# Patient Record
Sex: Female | Born: 1955 | Race: White | Hispanic: No | Marital: Married | Smoking: Former smoker
Health system: Southern US, Community
[De-identification: ages and names within clinical notes are randomized; demographics above are authoritative.]

## PROBLEM LIST (undated history)

## (undated) DIAGNOSIS — E079 Disorder of thyroid, unspecified: Secondary | ICD-10-CM

## (undated) DIAGNOSIS — I2510 Atherosclerotic heart disease of native coronary artery without angina pectoris: Secondary | ICD-10-CM

## (undated) DIAGNOSIS — T7840XA Allergy, unspecified, initial encounter: Secondary | ICD-10-CM

## (undated) DIAGNOSIS — I716 Thoracoabdominal aortic aneurysm, without rupture, unspecified: Secondary | ICD-10-CM

## (undated) DIAGNOSIS — E785 Hyperlipidemia, unspecified: Secondary | ICD-10-CM

## (undated) DIAGNOSIS — I10 Essential (primary) hypertension: Secondary | ICD-10-CM

## (undated) HISTORY — DX: Essential (primary) hypertension: I10

## (undated) HISTORY — PX: TONSILLECTOMY: SUR1361

## (undated) HISTORY — DX: Atherosclerotic heart disease of native coronary artery without angina pectoris: I25.10

## (undated) HISTORY — DX: Disorder of thyroid, unspecified: E07.9

## (undated) HISTORY — DX: Hyperlipidemia, unspecified: E78.5

## (undated) HISTORY — PX: ABDOMINAL AORTIC ANEURYSM REPAIR: SUR1152

## (undated) HISTORY — DX: Allergy, unspecified, initial encounter: T78.40XA

## (undated) HISTORY — DX: Thoracoabdominal aortic aneurysm, without rupture, unspecified: I71.60

---

## 2007-03-31 ENCOUNTER — Encounter (INDEPENDENT_AMBULATORY_CARE_PROVIDER_SITE_OTHER): Payer: Self-pay | Admitting: Internal Medicine

## 2007-04-25 ENCOUNTER — Ambulatory Visit: Payer: Self-pay | Admitting: Family Medicine

## 2007-04-25 DIAGNOSIS — E782 Mixed hyperlipidemia: Secondary | ICD-10-CM

## 2007-04-25 DIAGNOSIS — E039 Hypothyroidism, unspecified: Secondary | ICD-10-CM

## 2007-04-25 DIAGNOSIS — N951 Menopausal and female climacteric states: Secondary | ICD-10-CM

## 2007-04-25 DIAGNOSIS — F329 Major depressive disorder, single episode, unspecified: Secondary | ICD-10-CM

## 2007-04-25 DIAGNOSIS — G43009 Migraine without aura, not intractable, without status migrainosus: Secondary | ICD-10-CM | POA: Insufficient documentation

## 2007-04-25 DIAGNOSIS — E785 Hyperlipidemia, unspecified: Secondary | ICD-10-CM | POA: Insufficient documentation

## 2007-04-25 DIAGNOSIS — F17200 Nicotine dependence, unspecified, uncomplicated: Secondary | ICD-10-CM | POA: Insufficient documentation

## 2007-04-25 DIAGNOSIS — I10 Essential (primary) hypertension: Secondary | ICD-10-CM

## 2007-05-13 ENCOUNTER — Ambulatory Visit: Payer: Self-pay | Admitting: Family Medicine

## 2007-05-15 LAB — CONVERTED CEMR LAB
Direct LDL: 144.3 mg/dL
HDL: 33.4 mg/dL — ABNORMAL LOW (ref >39.0)
VLDL: 41 mg/dL — ABNORMAL HIGH (ref 0–40)

## 2007-05-22 ENCOUNTER — Ambulatory Visit: Payer: Self-pay | Admitting: Family Medicine

## 2007-07-24 ENCOUNTER — Ambulatory Visit: Payer: Self-pay | Admitting: Family Medicine

## 2007-07-24 ENCOUNTER — Encounter (INDEPENDENT_AMBULATORY_CARE_PROVIDER_SITE_OTHER): Payer: Self-pay | Admitting: Internal Medicine

## 2007-07-25 LAB — CONVERTED CEMR LAB
AST: 17 units/L (ref 0–37)
Direct LDL: 151.8 mg/dL
TSH: 1.56 microintl units/mL (ref 0.35–5.50)
Triglycerides: 133 mg/dL (ref 0–149)

## 2007-07-29 ENCOUNTER — Other Ambulatory Visit: Admission: RE | Admit: 2007-07-29 | Discharge: 2007-07-29 | Payer: Self-pay | Admitting: Family Medicine

## 2007-07-29 ENCOUNTER — Ambulatory Visit: Payer: Self-pay | Admitting: Family Medicine

## 2007-07-29 ENCOUNTER — Encounter (INDEPENDENT_AMBULATORY_CARE_PROVIDER_SITE_OTHER): Payer: Self-pay | Admitting: Internal Medicine

## 2007-07-29 LAB — CONVERTED CEMR LAB: Pap Smear: NORMAL

## 2007-08-05 ENCOUNTER — Encounter (INDEPENDENT_AMBULATORY_CARE_PROVIDER_SITE_OTHER): Payer: Self-pay | Admitting: *Deleted

## 2007-08-05 ENCOUNTER — Encounter (INDEPENDENT_AMBULATORY_CARE_PROVIDER_SITE_OTHER): Payer: Self-pay | Admitting: Internal Medicine

## 2008-09-07 ENCOUNTER — Telehealth (INDEPENDENT_AMBULATORY_CARE_PROVIDER_SITE_OTHER): Payer: Self-pay | Admitting: Internal Medicine

## 2008-09-15 ENCOUNTER — Ambulatory Visit: Payer: Self-pay | Admitting: Family Medicine

## 2008-09-16 ENCOUNTER — Ambulatory Visit: Payer: Self-pay | Admitting: Family Medicine

## 2008-09-20 LAB — CONVERTED CEMR LAB: TSH: 1.36 microintl units/mL (ref 0.35–5.50)

## 2008-09-21 ENCOUNTER — Telehealth (INDEPENDENT_AMBULATORY_CARE_PROVIDER_SITE_OTHER): Payer: Self-pay | Admitting: Internal Medicine

## 2008-09-21 LAB — CONVERTED CEMR LAB
ALT: 24 units/L (ref 0–35)
AST: 22 units/L (ref 0–37)
HDL: 42.6 mg/dL (ref >39.00)

## 2009-02-24 ENCOUNTER — Ambulatory Visit: Payer: Self-pay | Admitting: Family Medicine

## 2009-02-24 ENCOUNTER — Other Ambulatory Visit: Admission: RE | Admit: 2009-02-24 | Discharge: 2009-02-24 | Payer: Self-pay | Admitting: Family Medicine

## 2009-02-24 DIAGNOSIS — K921 Melena: Secondary | ICD-10-CM | POA: Insufficient documentation

## 2009-02-24 DIAGNOSIS — M79609 Pain in unspecified limb: Secondary | ICD-10-CM

## 2009-03-02 ENCOUNTER — Encounter (INDEPENDENT_AMBULATORY_CARE_PROVIDER_SITE_OTHER): Payer: Self-pay | Admitting: *Deleted

## 2009-03-10 ENCOUNTER — Encounter (INDEPENDENT_AMBULATORY_CARE_PROVIDER_SITE_OTHER): Payer: Self-pay | Admitting: Internal Medicine

## 2009-03-10 ENCOUNTER — Ambulatory Visit: Payer: Self-pay | Admitting: Family Medicine

## 2009-03-15 ENCOUNTER — Encounter (INDEPENDENT_AMBULATORY_CARE_PROVIDER_SITE_OTHER): Payer: Self-pay

## 2009-03-15 LAB — CONVERTED CEMR LAB: Vit D, 25-Hydroxy: 12 ng/mL — ABNORMAL LOW (ref 30–89)

## 2009-03-16 LAB — CONVERTED CEMR LAB
ALT: 35 units/L (ref 0–35)
Albumin: 4.1 g/dL (ref 3.5–5.2)
BUN: 8 mg/dL (ref 6–23)
Basophils Absolute: 0.1 10*3/uL (ref 0.0–0.1)
CO2: 29 meq/L (ref 19–32)
Chloride: 106 meq/L (ref 96–112)
Cholesterol: 166 mg/dL (ref 0–200)
Creatinine, Ser: 0.8 mg/dL (ref 0.4–1.2)
Glucose, Bld: 92 mg/dL (ref 70–99)
HCT: 40.6 % (ref 36.0–46.0)
Hemoglobin: 14 g/dL (ref 12.0–15.0)
LDL Cholesterol: 104 mg/dL — ABNORMAL HIGH (ref 0–99)
Lymphs Abs: 1.6 10*3/uL (ref 0.7–4.0)
MCHC: 34.4 g/dL (ref 30.0–36.0)
MCV: 98.7 fL (ref 78.0–100.0)
Monocytes Absolute: 0.4 10*3/uL (ref 0.1–1.0)
Neutro Abs: 3.1 10*3/uL (ref 1.4–7.7)
Platelets: 160 10*3/uL (ref 150.0–400.0)
RDW: 12.4 % (ref 11.5–14.6)
Total Bilirubin: 0.7 mg/dL (ref 0.3–1.2)
Triglycerides: 104 mg/dL (ref 0.0–149.0)

## 2009-03-22 ENCOUNTER — Encounter (INDEPENDENT_AMBULATORY_CARE_PROVIDER_SITE_OTHER): Payer: Self-pay | Admitting: Internal Medicine

## 2009-04-04 ENCOUNTER — Encounter (INDEPENDENT_AMBULATORY_CARE_PROVIDER_SITE_OTHER): Payer: Self-pay | Admitting: Internal Medicine

## 2009-04-06 ENCOUNTER — Encounter (INDEPENDENT_AMBULATORY_CARE_PROVIDER_SITE_OTHER): Payer: Self-pay | Admitting: Internal Medicine

## 2009-05-09 ENCOUNTER — Telehealth: Payer: Self-pay | Admitting: Family Medicine

## 2009-05-10 ENCOUNTER — Telehealth: Payer: Self-pay | Admitting: Family Medicine

## 2009-06-14 ENCOUNTER — Ambulatory Visit: Payer: Self-pay | Admitting: Family Medicine

## 2009-06-14 DIAGNOSIS — E559 Vitamin D deficiency, unspecified: Secondary | ICD-10-CM | POA: Insufficient documentation

## 2009-06-15 LAB — CONVERTED CEMR LAB: Vit D, 25-Hydroxy: 35 ng/mL (ref 30–89)

## 2009-09-14 ENCOUNTER — Ambulatory Visit: Payer: Self-pay | Admitting: Internal Medicine

## 2009-09-14 LAB — CONVERTED CEMR LAB
ALT: 19 units/L (ref 0–35)
HDL: 46.9 mg/dL (ref >39.00)
TSH: 0.16 microintl units/mL — ABNORMAL LOW (ref 0.35–5.50)
Total CHOL/HDL Ratio: 4
Triglycerides: 126 mg/dL (ref 0.0–149.0)

## 2009-11-08 ENCOUNTER — Telehealth: Payer: Self-pay | Admitting: Family Medicine

## 2009-11-14 ENCOUNTER — Ambulatory Visit: Payer: Self-pay | Admitting: Family Medicine

## 2009-12-19 ENCOUNTER — Telehealth: Payer: Self-pay | Admitting: Family Medicine

## 2010-01-02 ENCOUNTER — Encounter: Payer: Self-pay | Admitting: Family Medicine

## 2010-01-05 ENCOUNTER — Telehealth (INDEPENDENT_AMBULATORY_CARE_PROVIDER_SITE_OTHER): Payer: Self-pay | Admitting: *Deleted

## 2010-01-23 ENCOUNTER — Ambulatory Visit: Payer: Self-pay | Admitting: Family Medicine

## 2010-01-24 LAB — CONVERTED CEMR LAB
Cholesterol: 188 mg/dL (ref 0–200)
Total CHOL/HDL Ratio: 5
Triglycerides: 230 mg/dL — ABNORMAL HIGH (ref 0.0–149.0)

## 2010-03-06 ENCOUNTER — Ambulatory Visit: Payer: Self-pay | Admitting: Family Medicine

## 2010-03-06 DIAGNOSIS — J069 Acute upper respiratory infection, unspecified: Secondary | ICD-10-CM | POA: Insufficient documentation

## 2010-04-04 ENCOUNTER — Ambulatory Visit: Payer: Self-pay | Admitting: Family Medicine

## 2010-04-04 LAB — HM MAMMOGRAPHY: HM Mammogram: NORMAL

## 2010-05-09 NOTE — Progress Notes (Signed)
Summary: Patient Concern  Phone Note Call from Patient   Caller: Spouse Details for Reason: Pt was not informed of charge for forms Summary of Call: Stated patient was not informed of charge for form and was upset that this was not explained.   Initial call taken by: Clarisa Schools,  January 05, 2010 11:45 AM

## 2010-05-09 NOTE — Progress Notes (Signed)
Summary: regarding effexor  Phone Note From Pharmacy   [facility] [phone] * Summary of Call: Pt has script for brand name only effexor but she only wants generic.  Please advise. Initial call taken by: Lowella Petties CMA,  December 19, 2009 3:39 PM  Follow-up for Phone Call        ok to send prescription for generic. Ruthe Mannan MD  December 19, 2009 3:51 PM   Additional Follow-up for Phone Call Additional follow up Details #1::        Advised pharmacist at [facility]. Additional Follow-up by: Lowella Petties CMA,  December 19, 2009 4:44 PM

## 2010-05-09 NOTE — Assessment & Plan Note (Signed)
Summary: [name] F/U DLO   Vital Signs:  Patient profile:   55 year old female Height:      61 inches Weight:      156 pounds BMI:     29.58 Temp:     98.1 degrees F oral Pulse rate:   76 / minute Pulse rhythm:   regular BP sitting:   136 / 82  (left arm) Cuff size:   regular  Vitals Entered By: Linde Gillis CMA Duncan Dull) (January 23, 2010 8:49 AM) CC: 3 month follow up   History of Present Illness: 55 yo here to follow up HLD, Hypothyroidism.  1.  HLD- decreased simvasatin to 20 mg at last office visit in August.  Myalgias much better.    2.  Hypothyroidism- decreased leovthroid to 50 micrograms from 75 micrograms in August.  Feels she is sleeping better, has less GI upset.  Overall, feels much better.  Current Medications (verified): 1)  Amlodipine Besylate 5 Mg  Tabs (Amlodipine Besylate) .... Take 1 Tablet By Mouth Once A Day 2)  Effexor Xr 150 Mg  Cp24 (Venlafaxine Hcl) .Marland Kitchen.. 1 Once Daily For Depression 3)  Calcium Carbonate-Vitamin D 600-400 Mg-Unit  Tabs (Calcium Carbonate-Vitamin D) .... Otc Takes Two Daily 4)  Vitamin B-12 500 Mcg  Tabs (Cyanocobalamin) .... Otc As Directed 5)  Ferrous Sulfate 325 (65 Fe) Mg  Tabs (Ferrous Sulfate) .... Otc One Daily 6)  Ibuprofen 200 Mg Tabs (Ibuprofen) .... Otc As Directed 7)  Ergocalciferol 50000 Unit Caps (Ergocalciferol) .... Take One Capsule Each Week On The Same Day of The Week. 8)  Levothroid 50 Mcg Tabs (Levothyroxine Sodium) .Marland Kitchen.. 1 Tab By Mouth Daily. 9)  Simvastatin 20 Mg Tabs (Simvastatin) .Marland Kitchen.. 1 By Mouth At Bedtime  Allergies (verified): No Known Drug Allergies  Past History:  Past Medical History: Last updated: April 28, 2007 Hypertension Hypothyroidism  Past Surgical History: Last updated: 07/29/2007 [facility] Tonsillectomy--age 55  Family History: Last updated: 04-28-07 Father: died Kaylee--CVA/MI,    Mother: died 71--COPD,  Siblings: 1 half sis--DM,    DM-0 MI-0 CVA- Mcousin Prostate  Cancer-0 Breast Cancer-Maunt Ovarian Cancer-0 Uterine Cancer-? mAUNT Colon Cancer-0 Drug/ ETOH Abuse-0 Depression- 0  Social History: Last updated: 04-28-2007 Marital Status: Married Children: 2 45, 63, 1 grand son --54mo Occupation: Engineer, materials at [employer]  Risk Factors: Alcohol Use: 0 (02/24/2009) Caffeine Use: 5 (02/24/2009) Exercise: no (02/24/2009)  Risk Factors: Smoking Status: current (02/24/2009) Packs/Day: 1.0 (02/24/2009) Passive Smoke Exposure: yes (April 28, 2007)  Review of Systems      See HPI General:  Denies malaise. Eyes:  Denies blurring. ENT:  Denies difficulty swallowing. CV:  Denies chest pain or discomfort. Resp:  Denies shortness of breath. GI:  Denies abdominal pain and change in bowel habits.  Physical Exam  General:  alert, well-developed, well-nourished, and well-hydrated.   Lungs:  normal respiratory effort, no intercostal retractions, no accessory muscle use, and normal breath sounds.   Heart:  normal rate, regular rhythm, and no murmur.   Extremities:  no edema either lower leg Psych:  normally interactive and good eye contact.     Impression & Recommendations:  Problem # 1:  HYPERLIPIDEMIA (ICD-272.2) Assessment Unchanged Recheck FLP today. Her updated medication list for this problem includes:    Simvastatin 20 Mg Tabs (Simvastatin) .Marland Kitchen... 1 by mouth at bedtime  Orders: Venipuncture (16109) TLB-Lipid Panel (80061-LIPID)  Problem # 2:  HYPOTHYROIDISM (ICD-244.9) Assessment: Unchanged Recheck TSH today.  Hopefully since her symptoms  have improved  on lower dose, TSh will have improved. Her updated medication list for this problem includes:    Levothroid 50 Mcg Tabs (Levothyroxine sodium) .Marland Kitchen... 1 tab by mouth daily.  Orders: Venipuncture (45409) TLB-TSH (Thyroid Stimulating Hormone) (84443-TSH)  Complete Medication List: 1)  Amlodipine Besylate 5 Mg Tabs (Amlodipine besylate) .... Take 1 tablet by mouth once a  day 2)  Effexor Xr 150 Mg Cp24 (Venlafaxine hcl) .Marland Kitchen.. 1 once daily for depression 3)  Calcium Carbonate-vitamin D 600-400 Mg-unit Tabs (Calcium carbonate-vitamin d) .... Otc takes two daily 4)  Vitamin B-12 500 Mcg Tabs (Cyanocobalamin) .... Otc as directed 5)  Ferrous Sulfate 325 (65 Fe) Mg Tabs (Ferrous sulfate) .... Otc one daily 6)  Ibuprofen 200 Mg Tabs (Ibuprofen) .... Otc as directed 7)  Ergocalciferol 50000 Unit Caps (Ergocalciferol) .... Take one capsule each week on the same day of the week. 8)  Levothroid 50 Mcg Tabs (Levothyroxine sodium) .Marland Kitchen.. 1 tab by mouth daily. 9)  Simvastatin 20 Mg Tabs (Simvastatin) .Marland Kitchen.. 1 by mouth at bedtime   Orders Added: 1)  Venipuncture [36415] 2)  TLB-Lipid Panel [80061-LIPID] 3)  TLB-TSH (Thyroid Stimulating Hormone) [84443-TSH] 4)  Est. Patient Level IV [81191]    Current Allergies (reviewed today): No known allergies  Appended Document: [name] F/U DLO     Allergies: No Known Drug Allergies   Complete Medication List: 1)  Amlodipine Besylate 5 Mg Tabs (Amlodipine besylate) .... Take 1 tablet by mouth once a day 2)  Effexor Xr 150 Mg Cp24 (Venlafaxine hcl) .Marland Kitchen.. 1 once daily for depression 3)  Calcium Carbonate-vitamin D 600-400 Mg-unit Tabs (Calcium carbonate-vitamin d) .... Otc takes two daily 4)  Vitamin B-12 500 Mcg Tabs (Cyanocobalamin) .... Otc as directed 5)  Ferrous Sulfate 325 (65 Fe) Mg Tabs (Ferrous sulfate) .... Otc one daily 6)  Ibuprofen 200 Mg Tabs (Ibuprofen) .... Otc as directed 7)  Ergocalciferol 50000 Unit Caps (Ergocalciferol) .... Take one capsule each week on the same day of the week. 8)  Levothroid 50 Mcg Tabs (Levothyroxine sodium) .Marland Kitchen.. 1 tab by mouth daily. 9)  Simvastatin 20 Mg Tabs (Simvastatin) .Marland Kitchen.. 1 by mouth at bedtime  Other Orders: Admin 1st Vaccine (47829) Flu Vaccine 57yrs + [phone])   Orders Added: 1)  Admin 1st Vaccine [90471] 2)  Flu Vaccine 57yrs + [08657]  Flu Vaccine Consent Questions     Do  you have a history of severe allergic reactions to this vaccine? no    Any prior history of allergic reactions to egg and/or gelatin? no    Do you have a sensitivity to the preservative Thimersol? no    Do you have a past history of Guillan-Barre Syndrome? no    Do you currently have an acute febrile illness? no    Have you ever had a severe reaction to latex? no    Vaccine information given and explained to patient? yes    Are you currently pregnant? no    Lot Number:[id number]   Exp Date:10/07/2010   Site Given  Right Deltoid IM        .lbflu

## 2010-05-09 NOTE — Progress Notes (Signed)
Summary: Venlafaxine ER 75mg  refill  Phone Note Refill Request Call back at [phone] Message from:  [facility] on May 09, 2009 9:49 AM  Refills Requested: Medication #1:  EFFEXOR XR 75 MG XR24H-CAP take 3 daily for 2 wks [facility] refill on Effexor XR 75mg . No refill date given, Please advise.    Method Requested: Electronic Initial call taken by: Lewanda Rife LPN,  May 09, 2009 9:50 AM    Prescriptions: EFFEXOR XR 150 MG  CP24 (VENLAFAXINE HCL) 1 once daily for depression Brand medically necessary #30 x 6   Entered and Authorized by:   Shaune Leeks MD   Signed by:   Shaune Leeks MD on 05/09/2009   Method used:   [facility]        [facility]  [address] [address] [id number] ([facility]       [address]       [place], [place]  [address]       Ph: [phone] or [phone]       Fax: [phone]   RxID:   [id number]

## 2010-05-09 NOTE — Progress Notes (Signed)
Summary: Venlafaxine instead of name brand refill  Phone Note Call from Patient Call back at [phone] OR [phone]   Caller: Patient Call For: Dr. Hetty Ely Summary of Call: Pt went to pick up Venlafaxine HCL ER and was the name brand Effexor. Pt cannot afford the name brand and would like [facility]af[facility]ine sent to [facility].Please advise.  Initial call taken by: Lewanda Rife LPN,  May 10, 2009 12:49 PM  Follow-up for Phone Call        Generic fine with me. Follow-up by: Shaune Leeks MD,  May 10, 2009 1:49 PM  Additional Follow-up for Phone Call Additional follow up Details #1::        Pharmacy notified as instructed. Patient informed that pharmacy has been notified. Additional Follow-up by: Sydell Axon LPN,  May 10, 2009 4:14 PM

## 2010-05-09 NOTE — Progress Notes (Signed)
Summary: regarding possible drug interaction  Phone Note From Pharmacy   [facility]  [address] [phone] * Summary of Call: Note regarding drug interaction between simvastatin and amlodipine is on your desk. Initial call taken by: Lowella Petties CMA,  November 08, 2009 2:56 PM  Follow-up for Phone Call        Please have pt make appt so we can adjust her medicaiton.  please make this a 30 min appt. Ruthe Mannan MD  November 09, 2009 7:24 AM  30 minute appt made for 11/14/2009. Follow-up by: Linde Gillis CMA Duncan Dull),  November 09, 2009 9:11 AM

## 2010-05-09 NOTE — Letter (Signed)
Summary: Health Examination Certificate  Health Examination Certificate   Imported By: Maryln Gottron 01/09/2010 10:51:08  _____________________________________________________________________  External Attachment:    Type:   Image     Comment:   External Document

## 2010-05-09 NOTE — Assessment & Plan Note (Signed)
Summary: DEEP COUGH, COUGHING UP PHLIM / LFW   Vital Signs:  Patient profile:   55 year old female Height:      61 inches Weight:      157.25 pounds BMI:     29.82 Temp:     98.5 degrees F oral Pulse rate:   80 / minute Pulse rhythm:   regular BP sitting:   150 / 82  (left arm) Cuff size:   regular  Vitals Entered By: Linde Gillis CMA Duncan Dull) (March 06, 2010 3:50 PM) CC: deep cough, elevated BP   History of Present Illness: 55 yo here to discuss multiple issues.  1. URI- had URI symptoms 3 weeks ago- runny nose, chills, subjective fever, sore throat.  All symptoms resolved but still has a deep productive cough. No SOB.     HLD- decreased simvasatin to 40 mg .  Myalgias have not returned.    .  Hypothyroidism- decreased leovthroid to 50 micrograms from 75 micrograms in August. Now feels very fatigued.  TSH was elevated in October but she did not want to increase her medication at that time.  HTN- BP has been deteriorating over past several weeks.  Sister is an Charity fundraiser and has been checking her BP running in 150s-160s/80s- 90s.  On amlodipine 5 mg daily.  Some mild HA.  No  blurred vision or CP.  Current Medications (verified): 1)  Amlodipine Besylate 10 Mg  Tabs (Amlodipine Besylate) .... Once Daily 2)  Effexor Xr 150 Mg  Cp24 (Venlafaxine Hcl) .Marland Kitchen.. 1 Once Daily For Depression 3)  Calcium Carbonate-Vitamin D 600-400 Mg-Unit  Tabs (Calcium Carbonate-Vitamin D) .... Otc Takes Two Daily 4)  Vitamin B-12 500 Mcg  Tabs (Cyanocobalamin) .... Otc As Directed 5)  Ferrous Sulfate 325 (65 Fe) Mg  Tabs (Ferrous Sulfate) .... Otc One Daily 6)  Ibuprofen 200 Mg Tabs (Ibuprofen) .... Otc As Directed 7)  Ergocalciferol 50000 Unit Caps (Ergocalciferol) .... Take One Capsule Each Week On The Same Day of The Week. 8)  Levothyroxine Sodium 75 Mcg  Tabs (Levothyroxine Sodium) .Marland Kitchen.. 1 By Mouth Daily 9)  Simvastatin 40 Mg Tabs (Simvastatin) .... Take One Tablet At Bedtime 10)  Tussionex Pennkinetic Er  8-10 Mg/90ml Lqcr (Chlorpheniramine-Hydrocodone) .... 5 Ml By Mouth Two Times A Day As Needed Cough.  Allergies (verified): No Known Drug Allergies  Past History:  Past Medical History: Last updated: 05-10-07 Hypertension Hypothyroidism  Past Surgical History: Last updated: 07/29/2007 [facility] Tonsillectomy--age 73  Family History: Last updated: 05/10/2007 Father: died 73--CVA/MI,    Mother: died 71--COPD,  Siblings: 1 half sis--DM,    DM-0 MI-0 CVA- Mcousin Prostate Cancer-0 Breast Cancer-Maunt Ovarian Cancer-0 Uterine Cancer-? mAUNT Colon Cancer-0 Drug/ ETOH Abuse-0 Depression- 0  Social History: Last updated: 2007-05-10 Marital Status: Married Children: 2 58, 39, 1 grand son --24mo Occupation: Engineer, materials at [employer]  Risk Factors: Alcohol Use: 0 (02/24/2009) Caffeine Use: 5 (02/24/2009) Exercise: no (02/24/2009)  Risk Factors: Smoking Status: current (02/24/2009) Packs/Day: 1.0 (02/24/2009) Passive Smoke Exposure: yes (2007-05-10)  Review of Systems      See HPI General:  Denies chills and fever. CV:  Denies chest pain or discomfort. GI:  Denies abdominal pain.  Physical Exam  General:  alert, well-developed, well-nourished, and well-hydrated.   Head:  normocephalic and atraumatic.   Eyes:  vision grossly intact, pupils equal, pupils round, and pupils reactive to light.   Ears:  R ear normal and L ear normal.   Nose:  no  external deformity.   Mouth:  good dentition.   Neck:  no masses, no thyromegaly, no JVD, and no carotid bruits.   Lungs:  normal respiratory effort, no intercostal retractions, no accessory muscle use, and normal breath sounds.   Heart:  normal rate, regular rhythm, and no murmur.   Extremities:  no edema either lower leg Psych:  normally interactive and good eye contact.     Impression & Recommendations:  Problem # 1:  URI (ICD-465.9) Assessment New Likely viral, resolving.  Cough  suppressant as needed.   Her updated medication list for this problem includes:    Ibuprofen 200 Mg Tabs (Ibuprofen) ..... Otc as directed    Tussionex Pennkinetic Er 8-10 Mg/62ml Lqcr (Chlorpheniramine-hydrocodone) .Marland KitchenMarland KitchenMarland KitchenMarland Kitchen 5 ml by mouth two times a day as needed cough.  Problem # 2:  HYPERLIPIDEMIA (ICD-272.2) Assessment: Unchanged  Her updated medication list for this problem includes:    Simvastatin 40 Mg Tabs (Simvastatin) .Marland Kitchen... Take one tablet at bedtime  Problem # 3:  HYPOTHYROIDISM (ICD-244.9) Assessment: Deteriorated Increase synthroid to 75 micrograms.  Recheck in 8 weeks. Her updated medication list for this problem includes:    Levothyroxine Sodium 75 Mcg Tabs (Levothyroxine sodium) .Marland Kitchen... 1 by mouth daily  Problem # 4:  HYPERTENSION (ICD-401.9) Assessment: Deteriorated Increase Amlodipine to 10 mg daily.  Follow up in one month. Her updated medication list for this problem includes:    Amlodipine Besylate 10 Mg Tabs (Amlodipine besylate) ..... Once daily  Complete Medication List: 1)  Amlodipine Besylate 10 Mg Tabs (Amlodipine besylate) .... Once daily 2)  Effexor Xr 150 Mg Cp24 (Venlafaxine hcl) .Marland Kitchen.. 1 once daily for depression 3)  Calcium Carbonate-vitamin D 600-400 Mg-unit Tabs (Calcium carbonate-vitamin d) .... Otc takes two daily 4)  Vitamin B-12 500 Mcg Tabs (Cyanocobalamin) .... Otc as directed 5)  Ferrous Sulfate 325 (65 Fe) Mg Tabs (Ferrous sulfate) .... Otc one daily 6)  Ibuprofen 200 Mg Tabs (Ibuprofen) .... Otc as directed 7)  Ergocalciferol 50000 Unit Caps (Ergocalciferol) .... Take one capsule each week on the same day of the week. 8)  Levothyroxine Sodium 75 Mcg Tabs (Levothyroxine sodium) .Marland Kitchen.. 1 by mouth daily 9)  Simvastatin 40 Mg Tabs (Simvastatin) .... Take one tablet at bedtime 10)  Tussionex Pennkinetic Er 8-10 Mg/37ml Lqcr (Chlorpheniramine-hydrocodone) .... 5 ml by mouth two times a day as needed cough.  Patient Instructions: 1)  Please schedule lab  visit at end of December- TSH, lipid panel, hepatic panel (244.9, 272.4) Prescriptions: SIMVASTATIN 40 MG TABS (SIMVASTATIN) Take one tablet at bedtime  #90 x 3   Entered and Authorized by:   Ruthe Mannan MD   Signed by:   Ruthe Mannan MD on 03/06/2010   Method used:   [facility]        [id number]       [address]       [place], [place]  [address]       Ph: [phone] or [phone]       Fax: [phone]   RxID:   [id number] EFFEXOR XR 150 MG  CP24 (VENLAFAXINE HCL) 1 once daily for depression Brand medically necessary #90 x 6   Entered and Authorized by:   Ruthe Mannan MD   Signed by:   Ruthe Mannan MD on 03/06/2010   Method used:   [facility]        [facility]  [phone] [facility] (retail)       [address]       [place], [place]  [address]       Ph: [phone] or [phone]       Fax: [phone]   RxID:   [id number] AMLODIPINE BESYLATE 10 MG  TABS (AMLODIPINE BESYLATE) once daily  #90 x 3   Entered and Authorized by:   Ruthe Mannan MD   Signed by:   Ruthe Mannan MD on 03/06/2010   Method used:   [facility]        [id number]       [address]       [place], [place]  [address]       Ph: [phone] or [phone]       Fax: [phone]   RxID:   [id number] LEVOTHYROXINE SODIUM 75 MCG  TABS (LEVOTHYROXINE SODIUM) 1 by mouth daily  #90 x 3   Entered and Authorized by:   Ruthe Mannan MD   Signed by:   Ruthe Mannan MD on 03/06/2010   Method used:   [facility]        [id number]       [address]       [place], [place]  [address]       Ph: [phone] or [phone]       Fax: [phone]   RxID:   [id number] Sandria Senter ER 8-10 MG/5ML LQCR (CHLORPHENIRAMINE-HYDROCODONE) 5 ml by mouth two times a day as needed cough.  #5 ounces x 0   Entered and Authorized by:   Ruthe Mannan MD   Signed by:    Ruthe Mannan MD on 03/06/2010   Method used:   Print then Give to Patient   RxID:   [id number]    Orders Added: 1)  Est. Patient Level IV [44315]    Current Allergies (reviewed today): No known allergies

## 2010-05-09 NOTE — Assessment & Plan Note (Signed)
Summary: discuss drug interactions/nt   Vital Signs:  Patient profile:   55 year old female Height:      61 inches Weight:      155.13 pounds BMI:     29.42 Temp:     98.3 degrees F oral Pulse rate:   76 / minute Pulse rhythm:   regular BP sitting:   130 / 72  (left arm) Cuff size:   regular  Vitals Entered By: Linde Gillis CMA Simmie Camerer Dull) (November 14, 2009 2:34 PM) CC: discuss drug interaction   History of Present Illness: 55 yo new to me here to discuss a couple of issues.  1.  Drug interaction between Zocor and amlodipine.  She has had some myalgias in her arms and legs in past, not having any currently. Fasting lipid panel in June was great.  Currently on Zocor 40 mg daily and Amlodipine 5 mg daily.  2.  Low TSH- in June ,TSH was 0.16.  On Levothroid 75 micrograms daily.  Has noticed some hot flashes, difficulty sleeping, GI upset.  Denies palpitations.  Current Medications (verified): 1)  Amlodipine Besylate 5 Mg  Tabs (Amlodipine Besylate) .... Take 1 Tablet By Mouth Once A Day 2)  Effexor Xr 150 Mg  Cp24 (Venlafaxine Hcl) .Marland Kitchen.. 1 Once Daily For Depression 3)  Calcium Carbonate-Vitamin D 600-400 Mg-Unit  Tabs (Calcium Carbonate-Vitamin D) .... Otc Takes Two Daily 4)  Vitamin B-12 500 Mcg  Tabs (Cyanocobalamin) .... Otc As Directed 5)  Ferrous Sulfate 325 (65 Fe) Mg  Tabs (Ferrous Sulfate) .... Otc One Daily 6)  Ibuprofen 200 Mg Tabs (Ibuprofen) .... Otc As Directed 7)  Ergocalciferol 50000 Unit Caps (Ergocalciferol) .... Take One Capsule Each Week On The Same Day of The Week. 8)  Levothroid 50 Mcg Tabs (Levothyroxine Sodium) .Marland Kitchen.. 1 Tab By Mouth Daily. 9)  Simvastatin 20 Mg Tabs (Simvastatin) .Marland Kitchen.. 1 By Mouth At Bedtime  Allergies (verified): No Known Drug Allergies  Past History:  Past Medical History: Last updated: May 07, 2007 Hypertension Hypothyroidism  Past Surgical History: Last updated: 07/29/2007 [facility] Tonsillectomy--age 2  Family  History: Last updated: 05-07-2007 Father: died 73--CVA/MI,    Mother: died 71--COPD,  Siblings: 1 half sis--DM,    DM-0 MI-0 CVA- [place] Prostate Cancer-0 Breast Cancer-[place] Ovarian Cancer-0 Uterine Cancer-? mAUNT Colon Cancer-0 Drug/ ETOH Abuse-0 Depression- 0  Social History: Last updated: 2007-05-07 Marital Status: Married Children: 2 57, 98, 1 grand son --76mo Occupation: Engineer, materials at [employer]  Risk Factors: Alcohol Use: 0 (02/24/2009) Caffeine Use: 5 (02/24/2009) Exercise: no (02/24/2009)  Risk Factors: Smoking Status: current (02/24/2009) Packs/Day: 1.0 (02/24/2009) Passive Smoke Exposure: yes (May 07, 2007)  Review of Systems      See HPI General:  Denies malaise. Eyes:  Denies blurring. ENT:  Denies difficulty swallowing. CV:  Denies chest pain or discomfort and palpitations. Resp:  Denies shortness of breath. GI:  Complains of change in bowel habits; denies abdominal pain. Endo:  Complains of heat intolerance.  Physical Exam  General:  alert, well-developed, well-nourished, and well-hydrated.   Neck:  no masses, no thyromegaly, no JVD, and no carotid bruits.   Lungs:  normal respiratory effort, no intercostal retractions, no accessory muscle use, and normal breath sounds.   Heart:  normal rate, regular rhythm, and no murmur.   Extremities:  no edema either lower leg Psych:  normally interactive and good eye contact.     Impression & Recommendations:  Problem # 1:  HYPERLIPIDEMIA (ICD-272.2) Assessment Unchanged Time spent  with patient 25 minutes, more than 50% of this time was spent counseling patient on hyperlipidemia and hypothyroidism. Lipids well controlled when last checked in June. Decided to decrease Zocor to 20 mg and recheck lipids in 3 months.  If elevated at that time, change to Crestor.  The following medications were removed from the medication list:    Zocor 40 Mg Tabs (Simvastatin) .Marland Kitchen... 1 once daily for  cholesterol Her updated medication list for this problem includes:    Simvastatin 20 Mg Tabs (Simvastatin) .Marland Kitchen... 1 by mouth at bedtime  Problem # 2:  HYPOTHYROIDISM (ICD-244.9) Assessment: Deteriorated Decrease to levothroid 50 micrograms daily.  Recheck TSH in 3 months. The following medications were removed from the medication list:    Levothroid 75 Mcg Tabs (Levothyroxine sodium) .Marland Kitchen... 1 once daily by mouth Her updated medication list for this problem includes:    Levothroid 50 Mcg Tabs (Levothyroxine sodium) .Marland Kitchen... 1 tab by mouth daily.  Complete Medication List: 1)  Amlodipine Besylate 5 Mg Tabs (Amlodipine besylate) .... Take 1 tablet by mouth once a day 2)  Effexor Xr 150 Mg Cp24 (Venlafaxine hcl) .Marland Kitchen.. 1 once daily for depression 3)  Calcium Carbonate-vitamin D 600-400 Mg-unit Tabs (Calcium carbonate-vitamin d) .... Otc takes two daily 4)  Vitamin B-12 500 Mcg Tabs (Cyanocobalamin) .... Otc as directed 5)  Ferrous Sulfate 325 (65 Fe) Mg Tabs (Ferrous sulfate) .... Otc one daily 6)  Ibuprofen 200 Mg Tabs (Ibuprofen) .... Otc as directed 7)  Ergocalciferol 50000 Unit Caps (Ergocalciferol) .... Take one capsule each week on the same day of the week. 8)  Levothroid 50 Mcg Tabs (Levothyroxine sodium) .Marland Kitchen.. 1 tab by mouth daily. 9)  Simvastatin 20 Mg Tabs (Simvastatin) .Marland Kitchen.. 1 by mouth at bedtime  Patient Instructions: 1)  Great to meet you. 2)  Please come back to see me in 3 months. Prescriptions: EFFEXOR XR 75 MG XR24H-CAP (VENLAFAXINE HCL) take 3 daily for 2 wks, then increase to 4 daily  #98 x 1   Entered and Authorized by:   Ruthe Mannan MD   Signed by:   Ruthe Mannan MD on 11/14/2009   Method used:   [facility]        [id number]       [address]       [place], [place]  [address]       Ph: [phone] or [phone]       Fax: [phone]   RxID:   [id number] AMLODIPINE BESYLATE 5 MG  TABS (AMLODIPINE BESYLATE) Take 1 tablet  by mouth once a day  #30 Tablet x 3   Entered and Authorized by:   Ruthe Mannan MD   Signed by:   Ruthe Mannan MD on 11/14/2009   Method used:   [facility]ically to        [id number] ([facility]       [address]       [place], [place]  [address]       Ph: [phone] or [phone]       Fax: [phone]   RxID:   [id number] EFFEXOR XR 150 MG  CP24 (VENLAFAXINE HCL) 1 once daily for depression Brand medically necessary #30 x 6   Entered and Authorized by:   Ruthe Mannan MD   Signed by:   Ruthe Mannan  MD on 11/14/2009   Method used:   [facility] to        [id number] ([facility]       [address]       [place], [place]  [address]       Ph: [phone] or [phone]       Fax: [phone]   RxID:   [id number] SIMVASTATIN 20 MG TABS (SIMVASTATIN) 1 by mouth at bedtime  #90 x 3   Entered and Authorized by:   Ruthe Mannan MD   Signed by:   Ruthe Mannan MD on 11/14/2009   Method used:   [facility]ically to        [id number] ([facility]       [address]       [place], [place]  [address]       Ph: [phone] or [phone]       Fax: [phone]   RxID:   [id number] LEVOTHROID 50 MCG TABS (LEVOTHYROXINE SODIUM) 1 tab by mouth daily.  #30 x 3   Entered and Authorized by:   Ruthe Mannan MD   Signed by:   Ruthe Mannan MD on 11/14/2009   Method used:   [facility] to        [id number] ([facility]       [address]       [place], [place]  [address]       Ph: [phone] or [phone]       Fax: [phone]   RxID:   [id number]   Current Allergies (reviewed today): No known allergies

## 2010-10-31 ENCOUNTER — Encounter: Payer: Self-pay | Admitting: Family Medicine

## 2010-10-31 LAB — HM PAP SMEAR

## 2010-11-01 ENCOUNTER — Encounter: Payer: Self-pay | Admitting: Family Medicine

## 2010-11-01 ENCOUNTER — Ambulatory Visit (INDEPENDENT_AMBULATORY_CARE_PROVIDER_SITE_OTHER): Payer: BC Managed Care – PPO | Admitting: Family Medicine

## 2010-11-01 VITALS — BP 132/80 | HR 72 | Temp 98.3°F | Wt 161.8 lb

## 2010-11-01 DIAGNOSIS — I10 Essential (primary) hypertension: Secondary | ICD-10-CM

## 2010-11-01 DIAGNOSIS — E782 Mixed hyperlipidemia: Secondary | ICD-10-CM

## 2010-11-01 DIAGNOSIS — E039 Hypothyroidism, unspecified: Secondary | ICD-10-CM

## 2010-11-01 LAB — TSH: TSH: 0.38 u[IU]/mL (ref 0.35–5.50)

## 2010-11-01 LAB — LIPID PANEL: HDL: 40.6 mg/dL (ref >39.00)

## 2010-11-01 LAB — BASIC METABOLIC PANEL
BUN: 10 mg/dL (ref 6–23)
Creatinine, Ser: 0.6 mg/dL (ref 0.4–1.2)
GFR: 107.6 mL/min (ref >60.00)
Glucose, Bld: 94 mg/dL (ref 70–99)

## 2010-11-01 LAB — HEPATIC FUNCTION PANEL
ALT: 24 U/L (ref 0–35)
AST: 20 U/L (ref 0–37)
Alkaline Phosphatase: 103 U/L (ref 39–117)
Bilirubin, Direct: 0 mg/dL (ref 0.0–0.3)
Total Protein: 7.8 g/dL (ref 6.0–8.3)

## 2010-11-01 LAB — LDL CHOLESTEROL, DIRECT: Direct LDL: 111.3 mg/dL

## 2010-11-01 LAB — T4, FREE: Free T4: 0.89 ng/dL (ref 0.60–1.60)

## 2010-11-01 MED ORDER — AMLODIPINE BESYLATE 10 MG PO TABS: 10.0000 mg | ORAL_TABLET | Freq: Every day | ORAL | Status: DC

## 2010-11-01 MED ORDER — LEVOTHYROXINE SODIUM 75 MCG PO TABS: 75.0000 ug | ORAL_TABLET | Freq: Every day | ORAL | Status: DC

## 2010-11-01 MED ORDER — SIMVASTATIN 20 MG PO TABS: 20.0000 mg | ORAL_TABLET | Freq: Every day | ORAL | Status: DC

## 2010-11-01 MED ORDER — VENLAFAXINE HCL ER 150 MG PO TB24: 1.0000 | ORAL_TABLET | ORAL | Status: DC

## 2010-11-01 NOTE — Progress Notes (Signed)
55 yo here to follow up HLD, Hypothyroidism.  1.  HLD- on Simvastatin 20 mg daily. No myalgias on current dose but did have myalgias with 40 mg daily dosage.  Lab Results  Component Value Date   HDL 36.40* 01/23/2010   HDL 46.90 09/14/2009   HDL 41.50 03/10/2009   Lab Results  Component Value Date   LDLCALC 101* 09/14/2009   LDLCALC 104* 03/10/2009   LDLCALC 99 09/15/2008   Lab Results  Component Value Date   TRIG 230.0* 01/23/2010   TRIG 126.0 09/14/2009   TRIG 104.0 03/10/2009    2.  Hypothyroidism- on Synthroid 75 mcg daily.  Denies any symptoms of hypo or hyperthyroidism. Lab Results  Component Value Date   TSH 5.96* 01/23/2010     Patient Active Problem List  Diagnoses  . HYPOTHYROIDISM  . UNSPECIFIED VITAMIN D DEFICIENCY  . HYPERLIPIDEMIA  . TOBACCO ABUSE  . DEPRESSIVE DISORDER NOT ELSEWHERE CLASSIFIED  . COMMON MIGRAINE  . HYPERTENSION  . URI  . GUAIAC POSITIVE STOOL  . PERIMENOPAUSAL STATUS  . THUMB PAIN, BILATERAL   Past Medical History  Diagnosis Date  . Hypertension   . Thyroid disease    Past Surgical History  Procedure Date  . Tonsillectomy     age 55   History  Substance Use Topics  . Smoking status: Not on file  . Smokeless tobacco: Not on file  . Alcohol Use:    Family History  Problem Relation Age of Onset  . COPD Mother   . Diabetes Sister   . Cancer Maternal Aunt     breast & uterine   Current outpatient prescriptions:amLODipine (NORVASC) 10 MG tablet, Take 10 mg by mouth daily.  , Disp: , Rfl: ;  Calcium Carbonate-Vitamin D 600-400 MG-UNIT per tablet, Take 1 tablet by mouth 2 (two) times daily.  , Disp: , Rfl: ;  ibuprofen (ADVIL,MOTRIN) 200 MG tablet, OTC as directed , Disp: , Rfl: ;  levothyroxine (SYNTHROID, LEVOTHROID) 75 MCG tablet, Take 75 mcg by mouth daily.  , Disp: , Rfl:  simvastatin (ZOCOR) 40 MG tablet, Take 40 mg by mouth at bedtime.  , Disp: , Rfl: ;  venlafaxine (EFFEXOR-XR) 150 MG 24 hr capsule, Take 150 mg by mouth daily.   , Disp: , Rfl: ;  vitamin B-12 (CYANOCOBALAMIN) 500 MCG tablet, Take 500 mcg by mouth daily.  , Disp: , Rfl: ;  Vitamin D, Ergocalciferol, (DRISDOL) 50000 UNITS CAPS, Take 50,000 Units by mouth every 7 (seven) days.  , Disp: , Rfl:   The PMH, PSH, Social History, Family History, Medications, and allergies have been reviewed in [facility], and have been updated if relevant.   Review of Systems       See HPI General:  Denies malaise. Eyes:  Denies blurring. ENT:  Denies difficulty swallowing. CV:  Denies chest pain or discomfort. Resp:  Denies shortness of breath. GI:  Denies abdominal pain and change in bowel habits.  Physical Exam BP 132/80  Pulse 72  Temp(Src) 98.3 F (36.8 C) (Oral)  Wt 161 lb 12 oz (73.369 kg)  General:  alert, well-developed, well-nourished, and well-hydrated.   HEENT:  Neck supple, no adenopathy or tenderness Lungs:  normal respiratory effort, no intercostal retractions, no accessory muscle use, and normal breath sounds.   Heart:  normal rate, regular rhythm, and no murmur.   Extremities:  no edema either lower leg Psych:  normally interactive and good eye contact.    Assessment and Plan: 1. HYPOTHYROIDISM  Unchanged.  Recheck TSH, FT4 today.  2. HYPERTENSION  Stable.  Refill meds. Recheck BMET.  3. HYPERLIPIDEMIA   Recheck Lipid and hepatic panel today.

## 2011-03-29 ENCOUNTER — Encounter: Payer: Self-pay | Admitting: Family Medicine

## 2011-03-29 ENCOUNTER — Ambulatory Visit (INDEPENDENT_AMBULATORY_CARE_PROVIDER_SITE_OTHER): Payer: BC Managed Care – PPO | Admitting: Family Medicine

## 2011-03-29 VITALS — BP 120/80 | HR 74 | Temp 98.3°F | Wt 161.2 lb

## 2011-03-29 DIAGNOSIS — J069 Acute upper respiratory infection, unspecified: Secondary | ICD-10-CM

## 2011-03-29 MED ORDER — HYDROCOD POLST-CHLORPHEN POLST 10-8 MG/5ML PO LQCR: 5.0000 mL | Freq: Two times a day (BID) | ORAL | Status: DC | PRN

## 2011-03-29 NOTE — Progress Notes (Signed)
SUBJECTIVE:  Kaylee Pope is a 55 y.o. female who complains of coryza, congestion and sore throat for 3 days. She denies a history of anorexia, chest pain, chills, nausea and shortness of breath and denies a history of asthma. Patient denies smoke cigarettes.   Patient Active Problem List  Diagnoses  . HYPOTHYROIDISM  . UNSPECIFIED VITAMIN D DEFICIENCY  . HYPERLIPIDEMIA  . TOBACCO ABUSE  . DEPRESSIVE DISORDER NOT ELSEWHERE CLASSIFIED  . COMMON MIGRAINE  . HYPERTENSION  . URI  . GUAIAC POSITIVE STOOL  . PERIMENOPAUSAL STATUS  . THUMB PAIN, BILATERAL   Past Medical History  Diagnosis Date  . Hypertension   . Thyroid disease    Past Surgical History  Procedure Date  . Tonsillectomy     age 66   History  Substance Use Topics  . Smoking status: Never Smoker   . Smokeless tobacco: Not on file  . Alcohol Use: Not on file   Family History  Problem Relation Age of Onset  . COPD Mother   . Diabetes Sister   . Cancer Maternal Aunt     breast & uterine   No Known Allergies Current Outpatient Prescriptions on File Prior to Visit  Medication Sig Dispense Refill  . amLODipine (NORVASC) 10 MG tablet Take 1 tablet (10 mg total) by mouth daily.  30 tablet  6  . ibuprofen (ADVIL,MOTRIN) 200 MG tablet OTC as directed       . levothyroxine (SYNTHROID, LEVOTHROID) 75 MCG tablet Take 1 tablet (75 mcg total) by mouth daily.  30 tablet  6  . simvastatin (ZOCOR) 20 MG tablet Take 1 tablet (20 mg total) by mouth at bedtime.  30 tablet  6  . Venlafaxine HCl 150 MG TB24 Take 1 tablet (150 mg total) by mouth 1 day or 1 dose.  30 each  6   The PMH, PSH, Social History, Family History, Medications, and allergies have been reviewed in [facility], and have been updated if relevant.  OBJECTIVE: BP 120/80  Pulse 74  Temp(Src) 98.3 F (36.8 C) (Oral)  Wt 161 lb 4 oz (73.143 kg)  She appears well, vital signs are as noted. Ears normal.  Throat and pharynx normal.  Neck supple. No adenopathy in the  neck. Nose is congested. Sinuses non tender. The chest is clear, without wheezes or rales.  ASSESSMENT:  viral upper respiratory illness  PLAN: Symptomatic therapy suggested: push fluids, rest and return office visit prn if symptoms persist or worsen. Lack of antibiotic effectiveness discussed with her. Call or return to clinic prn if these symptoms worsen or fail to improve as anticipated.

## 2011-03-29 NOTE — Patient Instructions (Signed)
Have a Merry Christmas. This is likely a virus.  Drink lots of fluids.  Treat sympotmatically with Mucinex, nasal saline irrigation, and Tylenol/Ibuprofen. Cough suppressant at night. Call if not improving as expected in 5-7 days.

## 2011-04-02 ENCOUNTER — Telehealth: Payer: Self-pay | Admitting: *Deleted

## 2011-04-02 MED ORDER — AZITHROMYCIN 250 MG PO TABS: ORAL_TABLET | ORAL | Status: AC

## 2011-04-02 NOTE — Telephone Encounter (Signed)
Rx sent 

## 2011-04-02 NOTE — Telephone Encounter (Signed)
Patients spouse advised that [facility] for [facility].

## 2011-04-02 NOTE — Telephone Encounter (Signed)
Pt seen Thursday for flu and cough. Pt says she doesn't feel better, cough is worse she does want abx you mentioned.

## 2011-06-04 ENCOUNTER — Other Ambulatory Visit: Payer: Self-pay | Admitting: *Deleted

## 2011-06-04 MED ORDER — AMLODIPINE BESYLATE 10 MG PO TABS: 10.0000 mg | ORAL_TABLET | Freq: Every day | ORAL | Status: DC

## 2011-06-04 MED ORDER — LEVOTHYROXINE SODIUM 75 MCG PO TABS: 75.0000 ug | ORAL_TABLET | Freq: Every day | ORAL | Status: DC

## 2011-06-04 MED ORDER — SIMVASTATIN 20 MG PO TABS: 20.0000 mg | ORAL_TABLET | Freq: Every day | ORAL | Status: DC

## 2011-06-04 MED ORDER — VENLAFAXINE HCL ER 150 MG PO TB24: 1.0000 | ORAL_TABLET | ORAL | Status: DC

## 2011-06-28 ENCOUNTER — Ambulatory Visit (INDEPENDENT_AMBULATORY_CARE_PROVIDER_SITE_OTHER): Payer: BC Managed Care – PPO | Admitting: Family Medicine

## 2011-06-28 ENCOUNTER — Encounter: Payer: Self-pay | Admitting: Family Medicine

## 2011-06-28 ENCOUNTER — Telehealth: Payer: Self-pay | Admitting: Family Medicine

## 2011-06-28 VITALS — BP 140/78 | HR 84 | Temp 97.8°F | Wt 157.0 lb

## 2011-06-28 DIAGNOSIS — J029 Acute pharyngitis, unspecified: Secondary | ICD-10-CM

## 2011-06-28 NOTE — Progress Notes (Signed)
SUBJECTIVE: 56 y.o. female with sore throat, myalgias, swollen glands, headache and fever for 3 days. No history of rheumatic fever. Other symptoms: none.  Patient Active Problem List  Diagnoses  . HYPOTHYROIDISM  . UNSPECIFIED VITAMIN D DEFICIENCY  . HYPERLIPIDEMIA  . TOBACCO ABUSE  . DEPRESSIVE DISORDER NOT ELSEWHERE CLASSIFIED  . COMMON MIGRAINE  . HYPERTENSION  . URI  . GUAIAC POSITIVE STOOL  . PERIMENOPAUSAL STATUS  . THUMB PAIN, BILATERAL  . Pharyngitis   Past Medical History  Diagnosis Date  . Hypertension   . Thyroid disease    Past Surgical History  Procedure Date  . Tonsillectomy     age 23   History  Substance Use Topics  . Smoking status: Current Everyday Smoker  . Smokeless tobacco: Not on file  . Alcohol Use: Not on file   Family History  Problem Relation Age of Onset  . COPD Mother   . Diabetes Sister   . Cancer Maternal Aunt     breast & uterine   No Known Allergies Current Outpatient Prescriptions on File Prior to Visit  Medication Sig Dispense Refill  . amLODipine (NORVASC) 10 MG tablet Take 1 tablet (10 mg total) by mouth daily.  30 tablet  6  . chlorpheniramine-HYDROcodone (TUSSIONEX) 10-8 MG/5ML LQCR Take 5 mLs by mouth every 12 (twelve) hours as needed.  140 mL  0  . ibuprofen (ADVIL,MOTRIN) 200 MG tablet OTC as directed       . levothyroxine (SYNTHROID, LEVOTHROID) 75 MCG tablet Take 1 tablet (75 mcg total) by mouth daily.  30 tablet  6  . simvastatin (ZOCOR) 20 MG tablet Take 1 tablet (20 mg total) by mouth at bedtime.  30 tablet  6  . Venlafaxine HCl 150 MG TB24 Take 1 tablet (150 mg total) by mouth 1 day or 1 dose.  30 each  6   The PMH, PSH, Social History, Family History, Medications, and allergies have been reviewed in [facility], and have been updated if relevant.  OBJECTIVE:  BP 140/78  Pulse 84  Temp(Src) 97.8 F (36.6 C) (Oral)  Wt 157 lb (71.215 kg)  Vitals as noted above. Appears alert, well appearing, and in no distress. Ears:  bilateral TM's and external ear canals normal Oropharynx: erythematous Neck: supple, no significant adenopathy Lungs: clear to auscultation, no wheezes, rales or rhonchi, symmetric air entry Rapid Strep test is negative  ASSESSMENT: viral pharyngitis  PLAN: Per orders. Gargle, use acetaminophen or other OTC analgesic, call if no improvement in 5-7 days. The patient indicates understanding of these issues and agrees with the plan. Marland Kitchen

## 2011-06-28 NOTE — Telephone Encounter (Signed)
Caller: Kaylee Pope/Patient; PCP: Ruthe Mannan ([facility] Ramp); CB#: [phone]; Call regarding Sore Throat; Onset 06/27/11. Afebrile now but on Tylenol & Motrin. Temp 100 06/27/11.  Mild sinus drainage.  Menopausal. Advised to see MD within 24 hrs for no tonsils and white patches on back of throat per Sore Throat or Hoarseness Guideline. Appt scheduled for 1100 06/28/11 with  Dr Dayton Martes.

## 2011-06-29 ENCOUNTER — Telehealth: Payer: Self-pay

## 2011-06-29 MED ORDER — AMOXICILLIN-POT CLAVULANATE 875-125 MG PO TABS: 1.0000 | ORAL_TABLET | Freq: Two times a day (BID) | ORAL | Status: AC

## 2011-06-29 NOTE — Telephone Encounter (Signed)
Rx for Augmentin sent.

## 2011-06-29 NOTE — Telephone Encounter (Signed)
Advised patient

## 2011-06-29 NOTE — Telephone Encounter (Signed)
Pt saw Dr Dayton Martes 06/28/11 and last night pt started with productive cough with green mucus and small blood tinged clots. Pt also developed green mucus blood tinged when she blows her nose. Pt said sorebthroat is no better throat still feels dry and hard to swallow. No fever, no SOB or trouble breathing and no wheezing. Pt would like antibiotic sent to [facility]. Pt can be reached at [phone].

## 2011-07-11 ENCOUNTER — Telehealth: Payer: Self-pay | Admitting: Family Medicine

## 2011-07-11 MED ORDER — VENLAFAXINE HCL ER 150 MG PO CP24: 150.0000 mg | ORAL_CAPSULE | Freq: Every day | ORAL | Status: DC

## 2011-07-11 NOTE — Telephone Encounter (Signed)
Medicine sent to pharmacy

## 2011-07-11 NOTE — Telephone Encounter (Signed)
Caller: Husband Facility: not collected Patient: Kaylee, Pope DOB: 06-Oct-1955 Phone: [phone] Reason for Call: need to respond to fax from pharmacy about Denlafaxine 150 mg ER Regarding Appointment: Appt Date: Appt Time: Unknown Provider: Reason: Details: Outcome:

## 2011-07-11 NOTE — Telephone Encounter (Signed)
pts husband,Larry Hershman called and requested our office respond to fax sent on Mon by [facility]. I apologized to pts husband but I do not find a request from [facility]. Mr Ciocca asked me to call [facility] and speak with Misty Stanley re to Venlafaxine needing to be changed from tabs to capsules due to cost. Spoke with Misty Stanley at [facility] and Misty Stanley said Venlafaxine ER 150 tabs is $47.98 and with capsules will cost $11.00. Misty Stanley said needs to be 90 day supply also. I spoke with Peyton Najjar Gang to let him know I spoke with Misty Stanley and request is being sent to Dr Dayton Martes and he request a call back at [phone] when med is sent to [facility].

## 2011-07-11 NOTE — Telephone Encounter (Signed)
Yes ok to send rx for capsules

## 2012-01-04 ENCOUNTER — Other Ambulatory Visit: Payer: Self-pay | Admitting: Family Medicine

## 2012-02-02 ENCOUNTER — Other Ambulatory Visit: Payer: Self-pay | Admitting: Family Medicine

## 2012-02-04 NOTE — Telephone Encounter (Signed)
Pt was last seen in march for a sore throat, she has no upcoming appts scheduled.  Please advise if ok to refill.

## 2012-02-06 ENCOUNTER — Other Ambulatory Visit: Payer: Self-pay | Admitting: Family Medicine

## 2012-02-07 ENCOUNTER — Other Ambulatory Visit: Payer: Self-pay | Admitting: Family Medicine

## 2012-02-07 NOTE — Telephone Encounter (Signed)
Pt has no pending OV scheduled.  Ok to refill?

## 2012-02-08 NOTE — Telephone Encounter (Signed)
Refill called in to pharmacy

## 2012-02-25 ENCOUNTER — Other Ambulatory Visit: Payer: Self-pay

## 2012-02-25 NOTE — Telephone Encounter (Signed)
Pt left v/m requesting refill Venlafaxine, Simvastatin,thyroid med and BP med; did not leave pharmacy info and pt needs to schedule CPX. Left v/m requesting call back.

## 2012-02-26 ENCOUNTER — Other Ambulatory Visit: Payer: Self-pay

## 2012-02-26 MED ORDER — SIMVASTATIN 20 MG PO TABS: 20.0000 mg | ORAL_TABLET | Freq: Every day | ORAL | Status: DC

## 2012-02-26 MED ORDER — LEVOTHYROXINE SODIUM 75 MCG PO TABS: 75.0000 ug | ORAL_TABLET | Freq: Every day | ORAL | Status: DC

## 2012-02-26 MED ORDER — AMLODIPINE BESYLATE 10 MG PO TABS: ORAL_TABLET | ORAL | Status: DC

## 2012-02-26 NOTE — Telephone Encounter (Signed)
Pt request refill amlodipine,levothyroxine and simvastatin until CPX 05/01/12. Pt notified refills done.

## 2012-02-28 NOTE — Telephone Encounter (Signed)
See 02/26/12 phone note; pt made cpx and med refilled.

## 2012-04-18 ENCOUNTER — Telehealth: Payer: Self-pay | Admitting: Family Medicine

## 2012-04-18 NOTE — Telephone Encounter (Signed)
Yes ok to accommodate. 

## 2012-04-18 NOTE — Telephone Encounter (Signed)
Pt has a CPE scheduled for 05/01/2012 at 8:30, but due to having to take her autistic grandson to special ed, the only day she is available is Tuesday. The first Tuesday you have available is 06/24/2012, but she is changing her insurance cov'g in the middle of Feb and wants to have her CPE prior to that. Can you accommodate a Tuesday morning apptmt for her? Thank you.

## 2012-04-21 ENCOUNTER — Other Ambulatory Visit: Payer: Self-pay | Admitting: Family Medicine

## 2012-04-21 DIAGNOSIS — Z136 Encounter for screening for cardiovascular disorders: Secondary | ICD-10-CM

## 2012-04-21 DIAGNOSIS — E559 Vitamin D deficiency, unspecified: Secondary | ICD-10-CM

## 2012-04-21 DIAGNOSIS — Z0000 Encounter for general adult medical examination without abnormal findings: Secondary | ICD-10-CM

## 2012-04-21 DIAGNOSIS — E039 Hypothyroidism, unspecified: Secondary | ICD-10-CM

## 2012-04-21 DIAGNOSIS — I10 Essential (primary) hypertension: Secondary | ICD-10-CM

## 2012-04-24 ENCOUNTER — Other Ambulatory Visit: Payer: BC Managed Care – PPO

## 2012-04-29 ENCOUNTER — Other Ambulatory Visit: Payer: BC Managed Care – PPO

## 2012-04-29 ENCOUNTER — Ambulatory Visit (INDEPENDENT_AMBULATORY_CARE_PROVIDER_SITE_OTHER): Payer: BC Managed Care – PPO | Admitting: Family Medicine

## 2012-04-29 ENCOUNTER — Encounter: Payer: Self-pay | Admitting: Family Medicine

## 2012-04-29 ENCOUNTER — Other Ambulatory Visit (HOSPITAL_COMMUNITY)
Admission: RE | Admit: 2012-04-29 | Discharge: 2012-04-29 | Disposition: A | Discharge status: Home / Self Care | Payer: BC Managed Care – PPO | Source: Ambulatory Visit | Attending: Family Medicine | Admitting: Family Medicine

## 2012-04-29 ENCOUNTER — Encounter: Payer: BC Managed Care – PPO | Admitting: Family Medicine

## 2012-04-29 VITALS — BP 138/84 | HR 76 | Temp 97.8°F | Ht 60.5 in | Wt 163.0 lb

## 2012-04-29 DIAGNOSIS — F329 Major depressive disorder, single episode, unspecified: Secondary | ICD-10-CM

## 2012-04-29 DIAGNOSIS — Z1231 Encounter for screening mammogram for malignant neoplasm of breast: Secondary | ICD-10-CM

## 2012-04-29 DIAGNOSIS — E559 Vitamin D deficiency, unspecified: Secondary | ICD-10-CM

## 2012-04-29 DIAGNOSIS — Z01419 Encounter for gynecological examination (general) (routine) without abnormal findings: Secondary | ICD-10-CM | POA: Insufficient documentation

## 2012-04-29 DIAGNOSIS — E782 Mixed hyperlipidemia: Secondary | ICD-10-CM

## 2012-04-29 DIAGNOSIS — Z1151 Encounter for screening for human papillomavirus (HPV): Secondary | ICD-10-CM | POA: Insufficient documentation

## 2012-04-29 DIAGNOSIS — Z0000 Encounter for general adult medical examination without abnormal findings: Secondary | ICD-10-CM | POA: Insufficient documentation

## 2012-04-29 DIAGNOSIS — Z136 Encounter for screening for cardiovascular disorders: Secondary | ICD-10-CM

## 2012-04-29 DIAGNOSIS — Z1211 Encounter for screening for malignant neoplasm of colon: Secondary | ICD-10-CM

## 2012-04-29 DIAGNOSIS — I10 Essential (primary) hypertension: Secondary | ICD-10-CM

## 2012-04-29 DIAGNOSIS — F17200 Nicotine dependence, unspecified, uncomplicated: Secondary | ICD-10-CM

## 2012-04-29 DIAGNOSIS — E039 Hypothyroidism, unspecified: Secondary | ICD-10-CM

## 2012-04-29 LAB — LIPID PANEL
HDL: 37.9 mg/dL — ABNORMAL LOW (ref >39.00)
Total CHOL/HDL Ratio: 5
Triglycerides: 190 mg/dL — ABNORMAL HIGH (ref 0.0–149.0)
VLDL: 38 mg/dL (ref 0.0–40.0)

## 2012-04-29 LAB — T4, FREE: Free T4: 0.86 ng/dL (ref 0.60–1.60)

## 2012-04-29 LAB — COMPREHENSIVE METABOLIC PANEL
ALT: 22 U/L (ref 0–35)
AST: 20 U/L (ref 0–37)
Albumin: 4.1 g/dL (ref 3.5–5.2)
Alkaline Phosphatase: 104 U/L (ref 39–117)
BUN: 11 mg/dL (ref 6–23)
Potassium: 3.6 mEq/L (ref 3.5–5.1)
Sodium: 138 mEq/L (ref 135–145)

## 2012-04-29 LAB — CBC WITH DIFFERENTIAL/PLATELET
Basophils Absolute: 0.1 10*3/uL (ref 0.0–0.1)
HCT: 42.2 % (ref 36.0–46.0)
Lymphs Abs: 2 10*3/uL (ref 0.7–4.0)
MCV: 92.8 fl (ref 78.0–100.0)
Monocytes Absolute: 0.5 10*3/uL (ref 0.1–1.0)
Platelets: 208 10*3/uL (ref 150.0–400.0)
RDW: 13.3 % (ref 11.5–14.6)

## 2012-04-29 NOTE — Addendum Note (Signed)
Addended by: Eliezer Bottom on: 04/29/2012 08:21 AM   Modules accepted: Orders

## 2012-04-29 NOTE — Progress Notes (Signed)
57 yo female here for CPX.  No h/o abnormal pap smears- last pap smear was several years ago. LMP in 2008.  No post menopausal bleeding.  Over due for mammogram.  Maternal aunt had breast CA in her 67s. Declines flu shot.  UTD on all other prevention.   1.  HLD- on Simvastatin 20 mg daily. No myalgias on current dose but did have myalgias with 40 mg daily dosage.  Lab Results  Component Value Date   CHOL 184 11/01/2010   HDL 40.60 11/01/2010   LDLCALC 101* 09/14/2009   LDLDIRECT 111.3 11/01/2010   TRIG 228.0* 11/01/2010   CHOLHDL 5 11/01/2010     2.  Hypothyroidism- on Synthroid 75 mcg daily.  She does have some increased fatigue and weight gain.  Admits to not being very active.  Wt Readings from Last 3 Encounters:  04/29/12 163 lb (73.936 kg)  06/28/11 157 lb (71.215 kg)  03/29/11 161 lb 4 oz (73.143 kg)   3.  Depression- controlled on current dose of Effexor. Lab Results  Component Value Date   TSH 0.38 11/01/2010   Continues to smoke but has cut back.  Not quite ready to quit.  Patient Active Problem List  Diagnosis  . HYPOTHYROIDISM  . UNSPECIFIED VITAMIN D DEFICIENCY  . HYPERLIPIDEMIA  . TOBACCO ABUSE  . DEPRESSIVE DISORDER NOT ELSEWHERE CLASSIFIED  . HYPERTENSION  . Routine general medical examination at a health care facility   Past Medical History  Diagnosis Date  . Hypertension   . Thyroid disease    Past Surgical History  Procedure Date  . Tonsillectomy     age 88   History  Substance Use Topics  . Smoking status: Current Every Day Smoker  . Smokeless tobacco: Not on file  . Alcohol Use: Not on file   Family History  Problem Relation Age of Onset  . COPD Mother   . Diabetes Sister   . Cancer Maternal Aunt     breast & uterine   Current outpatient prescriptions:amLODipine (NORVASC) 10 MG tablet, TAKE 1 TABLET BY MOUTH EVERY DAY, Disp: 30 tablet, Rfl: 0;  amLODipine (NORVASC) 10 MG tablet, Take one tablet by mouth every day * Needs appointment for  any additional refills*, Disp: 30 tablet, Rfl: 1;  chlorpheniramine-HYDROcodone (TUSSIONEX) 10-8 MG/5ML LQCR, Take 5 mLs by mouth every 12 (twelve) hours as needed., Disp: 140 mL, Rfl: 0 ibuprofen (ADVIL,MOTRIN) 200 MG tablet, OTC as directed , Disp: , Rfl: ;  levothyroxine (SYNTHROID, LEVOTHROID) 75 MCG tablet, Take 1 tablet (75 mcg total) by mouth daily. * Needs appointment for any additional refills*, Disp: 30 tablet, Rfl: 1;  simvastatin (ZOCOR) 20 MG tablet, Take 1 tablet (20 mg total) by mouth at bedtime. * Needs appointment for any additional refills*, Disp: 30 tablet, Rfl: 1 venlafaxine XR (EFFEXOR-XR) 150 MG 24 hr capsule, TAKE ONE CAPSULE BY MOUTH EVERY DAY, Disp: 90 capsule, Rfl: 1  The PMH, PSH, Social History, Family History, Medications, and allergies have been reviewed in [facility], and have been updated if relevant.   Review of Systems       See HPI Patient reports no  vision/ hearing changes,anorexia, fever ,adenopathy, persistant / recurrent hoarseness, swallowing issues, chest pain, edema,persistant / recurrent cough, hemoptysis, dyspnea(rest, exertional, paroxysmal nocturnal), gastrointestinal  bleeding (melena, rectal bleeding), abdominal pain, excessive heart burn, GU symptoms(dysuria, hematuria, pyuria, voiding/incontinence  Issues) syncope, focal weakness, severe memory loss, concerning skin lesions, depression, anxiety, abnormal bruising/bleeding, major joint swelling, breast masses or abnormal  vaginal bleeding.     Physical Exam There were no vitals taken for this visit.  General:  Well-developed,well-nourished,in no acute distress; alert,appropriate and cooperative throughout examination Head:  normocephalic and atraumatic.   Eyes:  vision grossly intact, pupils equal, pupils round, and pupils reactive to light.   Ears:  R ear normal and L ear normal.   Nose:  no external deformity.   Mouth:  good dentition.   Neck:  No deformities, masses, or tenderness noted. Breasts:  No  mass, nodules, thickening, tenderness, bulging, retraction, inflamation, nipple discharge or skin changes noted.   Lungs:  Normal respiratory effort, chest expands symmetrically. Lungs are clear to auscultation, no crackles or wheezes. Heart:  Normal rate and regular rhythm. S1 and S2 normal without gallop, murmur, click, rub or other extra sounds. Abdomen:  Bowel sounds positive,abdomen soft and non-tender without masses, organomegaly or hernias noted. Rectal:  no external abnormalities.   Genitalia:  Pelvic Exam:        External: normal female genitalia without lesions or masses        Vagina: normal without lesions or masses        Cervix: normal without lesions or masses        Adnexa: normal bimanual exam without masses or fullness        Uterus: normal by palpation        Pap smear: performed Msk:  No deformity or scoliosis noted of thoracic or lumbar spine.   Extremities:  No clubbing, cyanosis, edema, or deformity noted with normal full range of motion of all joints.   Neurologic:  alert & oriented X3 and gait normal.   Skin:  Intact without suspicious lesions or rashes Cervical Nodes:  No lymphadenopathy noted Axillary Nodes:  No palpable lymphadenopathy Psych:  Cognition and judgment appear intact. Alert and cooperative with normal attention span and concentration. No apparent delusions, illusions, hallucinations  Assessment and Plan: 1. HYPOTHYROIDISM  On synthroid but may need dose adjustment. Recheck thyroid studies today. TSH, T4, Free  2. HYPERTENSION  Stable on Norvasc.  Continue current dose. Comprehensive metabolic panel  3. HYPERLIPIDEMIA  On Zocor.  Recheck lipids today.   4. DEPRESSIVE DISORDER NOT ELSEWHERE CLASSIFIED  Stable on current dose of Effexor.   5. TOBACCO ABUSE  Not ready to quit.   6. Unspecified vitamin D deficiency  Vitamin D, 25-hydroxy  7. Routine general medical examination at a health care facility  Reviewed preventive care protocols,  scheduled due services, and updated immunizations Discussed nutrition, exercise, diet, and healthy lifestyle.  CBC with Differential Pap smear  8. Other screening mammogram  MM Digital Screening  9. Screening for colon cancer  Fecal occult blood, imunochemical  10. Screening for ischemic heart disease  Lipid Panel

## 2012-04-29 NOTE — Patient Instructions (Addendum)
Great to see you. We will call you with your lab results.  Please stop by to see Kaylee Pope on your way out to set up your mammogram.

## 2012-04-30 ENCOUNTER — Other Ambulatory Visit: Payer: Self-pay | Admitting: Family Medicine

## 2012-04-30 LAB — VITAMIN D 25 HYDROXY (VIT D DEFICIENCY, FRACTURES): Vit D, 25-Hydroxy: 14 ng/mL — ABNORMAL LOW (ref 30–89)

## 2012-05-01 ENCOUNTER — Encounter: Payer: BC Managed Care – PPO | Admitting: Family Medicine

## 2012-05-02 ENCOUNTER — Other Ambulatory Visit: Payer: Self-pay | Admitting: *Deleted

## 2012-05-02 MED ORDER — VITAMIN D (ERGOCALCIFEROL) 1.25 MG (50000 UNIT) PO CAPS: ORAL_CAPSULE | ORAL | Status: DC

## 2012-05-06 ENCOUNTER — Encounter: Payer: Self-pay | Admitting: *Deleted

## 2012-05-06 ENCOUNTER — Encounter: Payer: Self-pay | Admitting: Family Medicine

## 2012-05-13 ENCOUNTER — Encounter: Payer: Self-pay | Admitting: Family Medicine

## 2012-05-14 ENCOUNTER — Encounter: Payer: Self-pay | Admitting: *Deleted

## 2012-05-14 ENCOUNTER — Encounter: Payer: Self-pay | Admitting: Family Medicine

## 2012-05-31 ENCOUNTER — Other Ambulatory Visit: Payer: Self-pay | Admitting: Family Medicine

## 2012-06-02 NOTE — Telephone Encounter (Signed)
Pt only supposed to take this strength of vitamin D for 6 weeks, she has taken for 4, refill sent to pharmacy for 2 more weeks.

## 2012-07-23 ENCOUNTER — Other Ambulatory Visit: Payer: Self-pay | Admitting: Family Medicine

## 2012-09-25 ENCOUNTER — Other Ambulatory Visit: Payer: Self-pay | Admitting: Family Medicine

## 2012-10-25 ENCOUNTER — Other Ambulatory Visit: Payer: Self-pay | Admitting: Family Medicine

## 2013-01-24 ENCOUNTER — Other Ambulatory Visit: Payer: Self-pay | Admitting: Family Medicine

## 2013-01-25 NOTE — Telephone Encounter (Signed)
Last office visit 04/29/2012. Ok to refill?

## 2013-03-16 ENCOUNTER — Encounter: Payer: Self-pay | Admitting: Internal Medicine

## 2013-03-16 ENCOUNTER — Ambulatory Visit (INDEPENDENT_AMBULATORY_CARE_PROVIDER_SITE_OTHER): Payer: BC Managed Care – PPO | Admitting: Internal Medicine

## 2013-03-16 VITALS — BP 136/80 | HR 72 | Temp 98.2°F | Resp 20 | Ht 65.0 in | Wt 164.8 lb

## 2013-03-16 DIAGNOSIS — J019 Acute sinusitis, unspecified: Secondary | ICD-10-CM

## 2013-03-16 DIAGNOSIS — B379 Candidiasis, unspecified: Secondary | ICD-10-CM

## 2013-03-16 MED ORDER — FLUCONAZOLE 150 MG PO TABS: 150.0000 mg | ORAL_TABLET | Freq: Once | ORAL | Status: DC

## 2013-03-16 MED ORDER — AMOXICILLIN-POT CLAVULANATE 875-125 MG PO TABS: 1.0000 | ORAL_TABLET | Freq: Two times a day (BID) | ORAL | Status: DC

## 2013-03-16 NOTE — Progress Notes (Signed)
Pre-visit discussion using our clinic review tool. No additional management support is needed unless otherwise documented below in the visit note.  

## 2013-03-16 NOTE — Patient Instructions (Signed)

## 2013-03-16 NOTE — Progress Notes (Signed)
HPI  Pt presents to the clinic today with c/o head congestion and cough. This started about a month ago. It does have periods of worsening. She is spitting up green mucous from her throat and blowing green mucous from her nose. She has had fatigue, chills. She has not had a fever that she is aware of. She has tried sudafed, mucinex, tylenol cold and sinus. This has provided only mildly relief. She has no history of seasonal allergies or asthma. She has not had sick contacts. She is a current smoker.   Review of Systems    Past Medical History  Diagnosis Date  . Hypertension   . Thyroid disease     Family History  Problem Relation Age of Onset  . COPD Mother   . Diabetes Sister   . Cancer Maternal Aunt     breast & uterine    History   Social History  . Marital Status: Married    Spouse Name: N/A    Number of Children: 2  . Years of Education: N/A   Occupational History  . Not on file.   Social History Main Topics  . Smoking status: Current Every Day Smoker  . Smokeless tobacco: Not on file  . Alcohol Use: Not on file  . Drug Use: Not on file  . Sexual Activity: Not on file   Other Topics Concern  . 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 Denies fever or abrupt weight changes.  HEENT:  Positive eye pain, pressure behind the eyes, facial pain, nasal congestion and sore throat. Denies eye redness, ear pain, ringing in the ears, wax buildup, runny nose or bloody nose. Respiratory: Positive cough and thick green sputum production. Denies difficulty breathing or shortness of breath.  Cardiovascular: Denies chest pain, chest tightness, palpitations or swelling in the hands or feet.   No other specific complaints in a complete review of systems (except as listed in HPI above).  Objective:    BP 136/80  Pulse 72  Temp(Src) 98.2 F (36.8 C)  (Oral)  Resp 20  Ht 5\' 5"  (1.651 m)  Wt 164 lb 12 oz (74.73 kg)  BMI 27.42 kg/m2 Wt Readings from Last 3 Encounters:  03/16/13 164 lb 12 oz (74.73 kg)  04/29/12 163 lb (73.936 kg)  06/28/11 157 lb (71.215 kg)    General: Appears her stated age, well developed, well nourished in NAD. HEENT: Head: normal shape and size, maxillary sinus tenderness; Eyes: sclera white, no icterus, conjunctiva pink, PERRLA and EOMs intact; Ears: Tm's gray and intact, normal light reflex; Nose: mucosa pink and moist, septum midline; Throat/Mouth: + PND. Teeth present, mucosa pink and moist, no exudate noted, no lesions or ulcerations noted.  Neck: Mild cervical lymphadenopathy. Neck supple, trachea midline. No massses, lumps or thyromegaly present.  Cardiovascular: Normal rate and rhythm. S1,S2 noted.  No murmur, rubs or gallops noted. No JVD or BLE edema. No carotid bruits noted. Pulmonary/Chest: Normal effort and positive vesicular breath sounds. No respiratory distress. No wheezes, rales or ronchi noted.      Assessment & Plan:   Acute bacterial sinusitis  Can use a Neti Pot which can be purchased from your local drug store. Augmentin BID for 10 days  RTC as needed or if symptoms persist.

## 2013-04-10 ENCOUNTER — Telehealth: Payer: Self-pay

## 2013-04-10 NOTE — Telephone Encounter (Signed)
Spoke to pt and she is aware.

## 2013-04-10 NOTE — Telephone Encounter (Signed)
The cough is likely from the sinus drainage. She does not need a repeat course of antibiotics. She needs to treat the cough with some cough syrup such as Delsym

## 2013-04-10 NOTE — Telephone Encounter (Signed)
Pt left v/m;pt was seen 03/16/13 with sinusitis; pt symptoms got better but never completely went away and now pt has developed a cough.pt finished antibiotic given on 03/16/13. Pt request antibiotic to [facility]. Unable to reach pt for additional info. No available appts.Please advise.

## 2013-04-10 NOTE — Telephone Encounter (Signed)
Pt left v/m requesting cb. 

## 2013-04-22 ENCOUNTER — Other Ambulatory Visit: Payer: Self-pay | Admitting: Family Medicine

## 2013-07-21 ENCOUNTER — Other Ambulatory Visit: Payer: Self-pay | Admitting: Family Medicine

## 2013-07-21 NOTE — Telephone Encounter (Signed)
Pt requesting medication refill. Last ov 04/2012 with no future appts scheduled. pls advise

## 2013-10-16 ENCOUNTER — Other Ambulatory Visit: Payer: Self-pay | Admitting: Family Medicine

## 2013-10-18 ENCOUNTER — Other Ambulatory Visit: Payer: Self-pay | Admitting: Family Medicine

## 2013-10-19 NOTE — Telephone Encounter (Signed)
Received refill request electronically. Last office visit 03/16/13. Is it okay to refill medication?

## 2013-10-30 ENCOUNTER — Other Ambulatory Visit: Payer: Self-pay | Admitting: Family Medicine

## 2013-11-29 ENCOUNTER — Other Ambulatory Visit: Payer: Self-pay | Admitting: Family Medicine

## 2013-12-01 ENCOUNTER — Other Ambulatory Visit: Payer: Self-pay

## 2013-12-01 MED ORDER — LEVOTHYROXINE SODIUM 75 MCG PO TABS: ORAL_TABLET | ORAL | Status: DC

## 2013-12-01 MED ORDER — AMLODIPINE BESYLATE 10 MG PO TABS: ORAL_TABLET | ORAL | Status: DC

## 2013-12-01 NOTE — Telephone Encounter (Signed)
Pt request refill on amlodipine and levothyroxine to [facility]; pt already scheduled appt with Dr Deborra Medina on 01/01/14. Advised pt done and to keep appt on 01/01/14.

## 2014-01-01 ENCOUNTER — Ambulatory Visit (INDEPENDENT_AMBULATORY_CARE_PROVIDER_SITE_OTHER): Payer: BC Managed Care – PPO | Admitting: Family Medicine

## 2014-01-01 ENCOUNTER — Encounter: Payer: Self-pay | Admitting: Family Medicine

## 2014-01-01 VITALS — BP 134/74 | HR 80 | Temp 98.3°F | Ht 60.25 in | Wt 167.0 lb

## 2014-01-01 DIAGNOSIS — I10 Essential (primary) hypertension: Secondary | ICD-10-CM

## 2014-01-01 DIAGNOSIS — F329 Major depressive disorder, single episode, unspecified: Secondary | ICD-10-CM

## 2014-01-01 DIAGNOSIS — F3289 Other specified depressive episodes: Secondary | ICD-10-CM

## 2014-01-01 DIAGNOSIS — E039 Hypothyroidism, unspecified: Secondary | ICD-10-CM

## 2014-01-01 DIAGNOSIS — F17200 Nicotine dependence, unspecified, uncomplicated: Secondary | ICD-10-CM

## 2014-01-01 DIAGNOSIS — Z23 Encounter for immunization: Secondary | ICD-10-CM

## 2014-01-01 DIAGNOSIS — E782 Mixed hyperlipidemia: Secondary | ICD-10-CM

## 2014-01-01 LAB — COMPREHENSIVE METABOLIC PANEL
ALT: 24 U/L (ref 0–35)
AST: 20 U/L (ref 0–37)
Albumin: 4.1 g/dL (ref 3.5–5.2)
Alkaline Phosphatase: 96 U/L (ref 39–117)
BILIRUBIN TOTAL: 0.5 mg/dL (ref 0.2–1.2)
BUN: 13 mg/dL (ref 6–23)
CO2: 26 mEq/L (ref 19–32)
CREATININE: 0.8 mg/dL (ref 0.4–1.2)
Calcium: 9.3 mg/dL (ref 8.4–10.5)
Chloride: 102 mEq/L (ref 96–112)
GFR: 74.59 mL/min (ref >60.00)
Glucose, Bld: 122 mg/dL — ABNORMAL HIGH (ref 70–99)
Potassium: 3.1 mEq/L — ABNORMAL LOW (ref 3.5–5.1)
Sodium: 135 mEq/L (ref 135–145)
TOTAL PROTEIN: 7.3 g/dL (ref 6.0–8.3)

## 2014-01-01 LAB — LIPID PANEL
Cholesterol: 227 mg/dL — ABNORMAL HIGH (ref 0–200)
HDL: 32 mg/dL — AB (ref >39.00)
NONHDL: 195
Total CHOL/HDL Ratio: 7
Triglycerides: 321 mg/dL — ABNORMAL HIGH (ref 0.0–149.0)
VLDL: 64.2 mg/dL — AB (ref 0.0–40.0)

## 2014-01-01 LAB — T4, FREE: Free T4: 0.88 ng/dL (ref 0.60–1.60)

## 2014-01-01 LAB — TSH: TSH: 1.75 u[IU]/mL (ref 0.35–4.50)

## 2014-01-01 LAB — LDL CHOLESTEROL, DIRECT: Direct LDL: 139.4 mg/dL

## 2014-01-01 MED ORDER — AMLODIPINE BESYLATE 10 MG PO TABS: ORAL_TABLET | ORAL | Status: DC

## 2014-01-01 MED ORDER — VENLAFAXINE HCL ER 150 MG PO CP24: ORAL_CAPSULE | ORAL | Status: DC

## 2014-01-01 MED ORDER — LEVOTHYROXINE SODIUM 75 MCG PO TABS: ORAL_TABLET | ORAL | Status: DC

## 2014-01-01 NOTE — Progress Notes (Signed)
Pre visit review using our clinic review tool, if applicable. No additional management support is needed unless otherwise documented below in the visit note. 

## 2014-01-01 NOTE — Progress Notes (Signed)
58 yo female here for follow up.  I have not seen her since 04/2012.  HLD- on Simvastatin 20 mg daily. Has not been taking simvastatin for at least 3 months. Had myalgias which improved when she stopped taking it.  Lab Results  Component Value Date   CHOL 182 04/29/2012   HDL 37.90* 04/29/2012   LDLCALC 106* 04/29/2012   LDLDIRECT 111.3 11/01/2010   TRIG 190.0* 04/29/2012   CHOLHDL 5 04/29/2012      Hypothyroidism- on Synthroid 75 mcg daily.  She does have some increased fatigue and weight gain.  Admits to not being very active. Lab Results  Component Value Date   TSH 0.80 04/29/2012   Overdue for labs  Wt Readings from Last 3 Encounters:  01/01/14 167 lb (75.751 kg)  03/16/13 164 lb 12 oz (74.73 kg)  04/29/12 163 lb (73.936 kg)    Depression- controlled on current dose of Effexor. Denies feeling depressed or anxious.  Patient Active Problem List   Diagnosis Date Noted  . UNSPECIFIED VITAMIN D DEFICIENCY 06/14/2009  . HYPOTHYROIDISM 04/25/2007  . HYPERLIPIDEMIA 04/25/2007  . TOBACCO ABUSE 04/25/2007  . DEPRESSIVE DISORDER NOT ELSEWHERE CLASSIFIED 04/25/2007  . HYPERTENSION 04/25/2007   Past Medical History  Diagnosis Date  . Hypertension   . Thyroid disease    Past Surgical History  Procedure Laterality Date  . Tonsillectomy      age 58   History  Substance Use Topics  . Smoking status: Current Every Day Smoker  . Smokeless tobacco: Not on file  . Alcohol Use: Not on file   Family History  Problem Relation Age of Onset  . COPD Mother   . Diabetes Sister   . Cancer Maternal Aunt     breast & uterine   Current outpatient prescriptions:amLODipine (NORVASC) 10 MG tablet, TAKE 1 TABLET BY MOUTH ONCE A DAY, Disp: 90 tablet, Rfl: 0;  ibuprofen (ADVIL,MOTRIN) 200 MG tablet, OTC as directed , Disp: , Rfl: ;  levothyroxine (SYNTHROID, LEVOTHROID) 75 MCG tablet, TAKE 1 TABLET BY MOUTH ONCE A DAY, Disp: 90 tablet, Rfl: 0 venlafaxine XR (EFFEXOR-XR) 150 MG 24 hr capsule,  TAKE ONE CAPSULE BY MOUTH EVERY DAY (OFFICE VISIT REQUIRED FOR ADDITIONAL REFILLS!!!), Disp: 90 capsule, Rfl: 0  The PMH, PSH, Social History, Family History, Medications, and allergies have been reviewed in [facility], and have been updated if relevant.   Review of Systems       See HPI   Denies any heat or cold intolerance No changes in her bowel habits No difficulty swallowing No CP or SOB No LE edema  Physical Exam BP 134/74  Pulse 80  Temp(Src) 98.3 F (36.8 C) (Oral)  Ht 5' 0.25" (1.53 m)  Wt 167 lb (75.751 kg)  BMI 32.36 kg/m2  SpO2 94%  General:  Well-developed,well-nourished,in no acute distress; alert,appropriate and cooperative throughout examination Head:  normocephalic and atraumatic.   Eyes:  vision grossly intact, pupils equal, pupils round, and pupils reactive to light.   Ears:  R ear normal and L ear normal.   Nose:  no external deformity.   Mouth:  good dentition.   Lungs:  Normal respiratory effort, chest expands symmetrically. Lungs are clear to auscultation, no crackles or wheezes. Heart:  Normal rate and regular rhythm. S1 and S2 normal without gallop, murmur, click, rub or other extra sounds. Abdomen:  Bowel sounds positive,abdomen soft and non-tender without masses, organomegaly or hernias noted. Msk:  No deformity or scoliosis noted of thoracic or lumbar  spine.   Extremities:  No clubbing, cyanosis, edema, or deformity noted with normal full range of motion of all joints.   Neurologic:  alert & oriented X3 and gait normal.   Skin:  Intact without suspicious lesions or rashes Psych:  Cognition and judgment appear intact. Alert and cooperative with normal attention span and concentration. No apparent delusions, illusions, hallucinations

## 2014-01-01 NOTE — Assessment & Plan Note (Signed)
Well controlled on current dose of effexor. No changes made today.

## 2014-01-01 NOTE — Assessment & Plan Note (Signed)
Likely deteriorated as she has stopped taking statin. Check cholesterol today. We talked about zetia. She will think about it.

## 2014-01-01 NOTE — Assessment & Plan Note (Signed)
Continue synthroid. Check labs today. 

## 2014-01-01 NOTE — Patient Instructions (Signed)
Good to see you. We will call you with your lab results from today. 

## 2014-01-04 ENCOUNTER — Telehealth: Payer: Self-pay | Admitting: Family Medicine

## 2014-01-04 NOTE — Telephone Encounter (Signed)
emmi emailed °

## 2014-01-07 ENCOUNTER — Telehealth: Payer: Self-pay | Admitting: Family Medicine

## 2014-01-07 NOTE — Telephone Encounter (Signed)
Pt called back to return phone call. Pt stated to please call on cell phone @ [phone]. She did say that if she did not answer to leave detailed message on the voicemail.

## 2014-01-08 NOTE — Telephone Encounter (Signed)
Lm on pts vm requesting a call back. Advised pt that if we were unable to speak today, regarding results, I will mail her results with instuction

## 2014-03-03 ENCOUNTER — Encounter: Payer: Self-pay | Admitting: Family Medicine

## 2014-03-03 ENCOUNTER — Ambulatory Visit (INDEPENDENT_AMBULATORY_CARE_PROVIDER_SITE_OTHER): Payer: BC Managed Care – PPO | Admitting: Family Medicine

## 2014-03-03 ENCOUNTER — Ambulatory Visit (INDEPENDENT_AMBULATORY_CARE_PROVIDER_SITE_OTHER)
Admission: RE | Admit: 2014-03-03 | Discharge: 2014-03-03 | Disposition: A | Discharge status: Home / Self Care | Payer: BC Managed Care – PPO | Source: Ambulatory Visit | Attending: Family Medicine | Admitting: Family Medicine

## 2014-03-03 VITALS — BP 142/80 | HR 85 | Temp 98.3°F | Wt 166.8 lb

## 2014-03-03 DIAGNOSIS — M79641 Pain in right hand: Secondary | ICD-10-CM

## 2014-03-03 MED ORDER — MELOXICAM 15 MG PO TABS: 15.0000 mg | ORAL_TABLET | Freq: Every day | ORAL | Status: DC

## 2014-03-03 NOTE — Patient Instructions (Signed)
Great to see you. Happy Thanksgiving.  I will call you with your xray results.  Meloxicam daily as needed with food.

## 2014-03-03 NOTE — Progress Notes (Signed)
Pre visit review using our clinic review tool, if applicable. No additional management support is needed unless otherwise documented below in the visit note. 

## 2014-03-03 NOTE — Progress Notes (Signed)
Subjective:   Patient ID: Kaylee Pope, female    DOB: 1956/04/01, 58 y.o.   MRN: [id number]  Kaylee Pope is a pleasant 58 y.o. year old female who presents to clinic today with Hand Pain  on 03/03/2014  HPI: Right hand pain intermittently for years.  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 Pain sometimes shoots up her arm.  Thumb is the most effected.  Never warm or red. Sometimes feels her grip strength is decreased due to pain but not independently (ie when she is not having pain).  Has been taking Ibuprofen prn which has been helpful.  Current Outpatient Prescriptions on File Prior to Visit  Medication Sig Dispense Refill  . amLODipine (NORVASC) 10 MG tablet TAKE 1 TABLET BY MOUTH ONCE A DAY 90 tablet 0  . ibuprofen (ADVIL,MOTRIN) 200 MG tablet OTC as directed     . levothyroxine (SYNTHROID, LEVOTHROID) 75 MCG tablet TAKE 1 TABLET BY MOUTH ONCE A DAY 90 tablet 0  . venlafaxine XR (EFFEXOR-XR) 150 MG 24 hr capsule TAKE ONE CAPSULE BY MOUTH EVERY DAY (OFFICE VISIT REQUIRED FOR ADDITIONAL REFILLS!!!) 90 capsule 0   No current facility-administered medications on file prior to visit.    No Known Allergies  Past Medical History  Diagnosis Date  . Hypertension   . Thyroid disease     Past Surgical History  Procedure Laterality Date  . Tonsillectomy      age 23    Family History  Problem Relation Age of Onset  . COPD Mother   . Diabetes Sister   . Cancer Maternal Aunt     breast & uterine    History   Social History  . Marital Status: Married    Spouse Name: N/A    Number of Children: 2  . Years of Education: N/A   Occupational History  . Not on file.   Social History Main Topics  . Smoking status: Current Every Day Smoker  . Smokeless tobacco: Not on file  . Alcohol Use: Not on file  . Drug Use: Not on file  . Sexual Activity: Not on file   Other Topics Concern  . 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   Review of Systems  Constitutional: Negative.   Musculoskeletal: Positive for arthralgias.  Neurological: Negative.   All other systems reviewed and are negative.      Objective:    BP 142/80 mmHg  Pulse 85  Temp(Src) 98.3 F (36.8 C) (Oral)  Wt 166 lb 12 oz (75.637 kg)  SpO2 95%   Physical Exam  Constitutional: She is oriented to person, place, and time. She appears well-developed and well-nourished. No distress.  HENT:  Head: Normocephalic.  Musculoskeletal:       Right hand: She exhibits decreased range of motion. She exhibits no bony tenderness, no deformity, no laceration and no swelling. Normal sensation noted. Decreased strength noted.  Neurological: She is alert and oriented to person, place, and time. No cranial nerve deficit. Coordination normal.  Skin: Skin is warm and dry.  Psychiatric: She has a normal mood and affect. Her behavior is normal. Judgment and thought content normal.  Nursing note and vitals reviewed.  Assessment & Plan:   Right hand pain - Plan: DG Hand Complete Right No Follow-up on file.

## 2014-03-03 NOTE — Assessment & Plan Note (Signed)
Progressive- concerning for OA and possible tendonitis. eRx sent for meloxicam to take with food. She has appt with Dr. Lynann Bologna- advised to keep this appt. Hand xray today given duration of symptoms.

## 2014-03-30 ENCOUNTER — Other Ambulatory Visit: Payer: Self-pay | Admitting: *Deleted

## 2014-03-30 MED ORDER — MELOXICAM 15 MG PO TABS: 15.0000 mg | ORAL_TABLET | Freq: Every day | ORAL | Status: DC

## 2014-03-31 ENCOUNTER — Other Ambulatory Visit: Payer: Self-pay | Admitting: *Deleted

## 2014-03-31 MED ORDER — LEVOTHYROXINE SODIUM 75 MCG PO TABS: ORAL_TABLET | ORAL | Status: DC

## 2014-03-31 MED ORDER — VENLAFAXINE HCL ER 150 MG PO CP24: ORAL_CAPSULE | ORAL | Status: DC

## 2014-03-31 MED ORDER — AMLODIPINE BESYLATE 10 MG PO TABS: ORAL_TABLET | ORAL | Status: DC

## 2014-03-31 NOTE — Telephone Encounter (Signed)
Mr Harnden wants to know if pts refills have been sent to [facility]. [facility] for pick up. Notified pts husband ready for pick up.

## 2014-05-03 ENCOUNTER — Ambulatory Visit (INDEPENDENT_AMBULATORY_CARE_PROVIDER_SITE_OTHER): Payer: BLUE CROSS/BLUE SHIELD | Admitting: Podiatry

## 2014-05-03 ENCOUNTER — Encounter: Payer: Self-pay | Admitting: Podiatry

## 2014-05-03 VITALS — BP 140/87 | HR 88 | Resp 16

## 2014-05-03 DIAGNOSIS — L600 Ingrowing nail: Secondary | ICD-10-CM

## 2014-05-03 MED ORDER — NEOMYCIN-POLYMYXIN-HC 3.5-10000-1 OT SOLN: OTIC | Status: DC

## 2014-05-03 NOTE — Progress Notes (Signed)
She presents today with chief complaint of ingrown toenail tibial border of the hallux bilateral. She states they've been bothering her for quite some time and she is ready to have them removed.  Objective: Vital signs are stable she's alert and oriented 3. Pulses are palpable bilateral. Sharp rated now margins to the tibial border of the hallux bilateral. Mild erythema is noted no cellulitis drainage or odor.  Assessment: Ingrown nail paronychia abscess hallux bilateral.  Plan: Chemical matrixectomy's were performed today to the tibial border of the hallux bilateral after local anesthetic was administered. She tolerated this procedure well and I will follow-up with her in 1 week. She was given both on oral and written home going instructions for the care of her feet and soaking instructions. As well as a prescription for Cortisporin Otic which she will apply twice daily after soaking. She will notify us earlier with questions or concerns.

## 2014-05-03 NOTE — Patient Instructions (Signed)

## 2014-05-10 ENCOUNTER — Ambulatory Visit: Payer: BLUE CROSS/BLUE SHIELD | Admitting: Podiatry

## 2014-06-27 ENCOUNTER — Other Ambulatory Visit: Payer: Self-pay | Admitting: Family Medicine

## 2014-10-07 ENCOUNTER — Encounter: Payer: Self-pay | Admitting: *Deleted

## 2014-10-14 ENCOUNTER — Other Ambulatory Visit: Payer: Self-pay | Admitting: Family Medicine

## 2014-10-14 DIAGNOSIS — F17200 Nicotine dependence, unspecified, uncomplicated: Secondary | ICD-10-CM

## 2014-10-14 DIAGNOSIS — I10 Essential (primary) hypertension: Secondary | ICD-10-CM

## 2014-10-14 DIAGNOSIS — Z0000 Encounter for general adult medical examination without abnormal findings: Secondary | ICD-10-CM

## 2014-10-20 ENCOUNTER — Other Ambulatory Visit (INDEPENDENT_AMBULATORY_CARE_PROVIDER_SITE_OTHER): Payer: 59

## 2014-10-20 DIAGNOSIS — Z0000 Encounter for general adult medical examination without abnormal findings: Secondary | ICD-10-CM

## 2014-10-20 DIAGNOSIS — R7989 Other specified abnormal findings of blood chemistry: Secondary | ICD-10-CM

## 2014-10-20 LAB — COMPREHENSIVE METABOLIC PANEL
ALT: 20 U/L (ref 0–35)
AST: 16 U/L (ref 0–37)
Albumin: 4.3 g/dL (ref 3.5–5.2)
Alkaline Phosphatase: 106 U/L (ref 39–117)
BUN: 12 mg/dL (ref 6–23)
CALCIUM: 9.5 mg/dL (ref 8.4–10.5)
CO2: 32 meq/L (ref 19–32)
Chloride: 103 mEq/L (ref 96–112)
Creatinine, Ser: 0.73 mg/dL (ref 0.40–1.20)
GFR: 86.26 mL/min (ref >60.00)
GLUCOSE: 96 mg/dL (ref 70–99)
Potassium: 3.7 mEq/L (ref 3.5–5.1)
SODIUM: 142 meq/L (ref 135–145)
TOTAL PROTEIN: 7.4 g/dL (ref 6.0–8.3)
Total Bilirubin: 0.5 mg/dL (ref 0.2–1.2)

## 2014-10-20 LAB — LIPID PANEL
Cholesterol: 236 mg/dL — ABNORMAL HIGH (ref 0–200)
HDL: 36.8 mg/dL — ABNORMAL LOW (ref >39.00)
NonHDL: 199.2
TRIGLYCERIDES: 209 mg/dL — AB (ref 0.0–149.0)
Total CHOL/HDL Ratio: 6
VLDL: 41.8 mg/dL — AB (ref 0.0–40.0)

## 2014-10-20 LAB — CBC WITH DIFFERENTIAL/PLATELET
BASOS ABS: 0 10*3/uL (ref 0.0–0.1)
Basophils Relative: 0.6 % (ref 0.0–3.0)
Eosinophils Absolute: 0.2 10*3/uL (ref 0.0–0.7)
Eosinophils Relative: 2.8 % (ref 0.0–5.0)
HCT: 42.5 % (ref 36.0–46.0)
HEMOGLOBIN: 14.3 g/dL (ref 12.0–15.0)
LYMPHS PCT: 30.6 % (ref 12.0–46.0)
Lymphs Abs: 1.9 10*3/uL (ref 0.7–4.0)
MCHC: 33.7 g/dL (ref 30.0–36.0)
MCV: 91.9 fl (ref 78.0–100.0)
Monocytes Absolute: 0.4 10*3/uL (ref 0.1–1.0)
Monocytes Relative: 6.9 % (ref 3.0–12.0)
NEUTROS ABS: 3.6 10*3/uL (ref 1.4–7.7)
NEUTROS PCT: 59.1 % (ref 43.0–77.0)
PLATELETS: 221 10*3/uL (ref 150.0–400.0)
RBC: 4.62 Mil/uL (ref 3.87–5.11)
RDW: 13.3 % (ref 11.5–15.5)
WBC: 6.1 10*3/uL (ref 4.0–10.5)

## 2014-10-20 LAB — LDL CHOLESTEROL, DIRECT: Direct LDL: 161 mg/dL

## 2014-10-20 LAB — TSH: TSH: 2.72 u[IU]/mL (ref 0.35–4.50)

## 2014-10-28 ENCOUNTER — Encounter: Payer: Self-pay | Admitting: Family Medicine

## 2014-10-28 ENCOUNTER — Other Ambulatory Visit (HOSPITAL_COMMUNITY)
Admission: RE | Admit: 2014-10-28 | Discharge: 2014-10-28 | Disposition: A | Discharge status: Home / Self Care | Payer: 59 | Source: Ambulatory Visit | Attending: Family Medicine | Admitting: Family Medicine

## 2014-10-28 ENCOUNTER — Ambulatory Visit (INDEPENDENT_AMBULATORY_CARE_PROVIDER_SITE_OTHER): Payer: 59 | Admitting: Family Medicine

## 2014-10-28 VITALS — BP 138/72 | HR 80 | Temp 98.0°F | Ht 60.0 in | Wt 169.5 lb

## 2014-10-28 DIAGNOSIS — E039 Hypothyroidism, unspecified: Secondary | ICD-10-CM | POA: Diagnosis not present

## 2014-10-28 DIAGNOSIS — E669 Obesity, unspecified: Secondary | ICD-10-CM | POA: Insufficient documentation

## 2014-10-28 DIAGNOSIS — E782 Mixed hyperlipidemia: Secondary | ICD-10-CM

## 2014-10-28 DIAGNOSIS — I10 Essential (primary) hypertension: Secondary | ICD-10-CM

## 2014-10-28 DIAGNOSIS — Z720 Tobacco use: Secondary | ICD-10-CM | POA: Diagnosis not present

## 2014-10-28 DIAGNOSIS — Z1211 Encounter for screening for malignant neoplasm of colon: Secondary | ICD-10-CM

## 2014-10-28 DIAGNOSIS — F17200 Nicotine dependence, unspecified, uncomplicated: Secondary | ICD-10-CM

## 2014-10-28 DIAGNOSIS — Z01419 Encounter for gynecological examination (general) (routine) without abnormal findings: Secondary | ICD-10-CM | POA: Diagnosis not present

## 2014-10-28 DIAGNOSIS — Z1151 Encounter for screening for human papillomavirus (HPV): Secondary | ICD-10-CM | POA: Diagnosis present

## 2014-10-28 MED ORDER — EZETIMIBE 10 MG PO TABS: 10.0000 mg | ORAL_TABLET | Freq: Every day | ORAL | Status: DC

## 2014-10-28 MED ORDER — AMLODIPINE BESYLATE 10 MG PO TABS: 10.0000 mg | ORAL_TABLET | Freq: Every day | ORAL | Status: DC

## 2014-10-28 MED ORDER — LEVOTHYROXINE SODIUM 75 MCG PO TABS: 75.0000 ug | ORAL_TABLET | Freq: Every day | ORAL | Status: DC

## 2014-10-28 MED ORDER — MELOXICAM 15 MG PO TABS: 15.0000 mg | ORAL_TABLET | Freq: Every day | ORAL | Status: DC

## 2014-10-28 NOTE — Assessment & Plan Note (Signed)
Well controlled on current rxs. No changes made today. 

## 2014-10-28 NOTE — Progress Notes (Signed)
59 yo pleasant female here for CPX and follow up of chronic medical conditions.  No h/o abnormal pap smears- last pap smear 2014. LMP in 2008.  No post menopausal bleeding.  Over due for mammogram- has one scheduled for August 2016.  Maternal aunt had breast CA in her 67s. Colonoscopy 2010 per pt.    HLD-  Deteriorated.  Stopped taking Simvastatin 20 mg daily due to myalgias.   Lab Results  Component Value Date   CHOL 236* 10/20/2014   HDL 36.80* 10/20/2014   LDLCALC 106* 04/29/2012   LDLDIRECT 161.0 10/20/2014   TRIG 209.0* 10/20/2014   CHOLHDL 6 10/20/2014      Hypothyroidism- on Synthroid 75 mcg daily.  She does have some increased fatigue and weight gain.  Admits to not being very active.  Wt Readings from Last 3 Encounters:  10/28/14 169 lb 8 oz (76.885 kg)  03/03/14 166 lb 12 oz (75.637 kg)  01/01/14 167 lb (75.751 kg)     Depression- controlled on current dose of Effexor. Lab Results  Component Value Date   TSH 2.72 10/20/2014   Continues to smoke but has cut back.  Not quite ready to quit.  Obesity- asking about diet pills today.  Admits to eating a lot of fried foods and carbohydrates. Not exercising regularly.  Wt Readings from Last 3 Encounters:  10/28/14 169 lb 8 oz (76.885 kg)  03/03/14 166 lb 12 oz (75.637 kg)  01/01/14 167 lb (75.751 kg)    Patient Active Problem List   Diagnosis Date Noted  . Well woman exam with routine gynecological exam 10/28/2014  . Right hand pain 03/03/2014  . UNSPECIFIED VITAMIN D DEFICIENCY 06/14/2009  . Hypothyroidism 04/25/2007  . HYPERLIPIDEMIA 04/25/2007  . TOBACCO ABUSE 04/25/2007  . DEPRESSIVE DISORDER NOT ELSEWHERE CLASSIFIED 04/25/2007  . Essential hypertension 04/25/2007   Past Medical History  Diagnosis Date  . Hypertension   . Thyroid disease    Past Surgical History  Procedure Laterality Date  . Tonsillectomy      age 58   History  Substance Use Topics  . Smoking status: Current Every Day Smoker   . Smokeless tobacco: Not on file  . Alcohol Use: Not on file   Family History  Problem Relation Age of Onset  . COPD Mother   . Diabetes Sister   . Cancer Maternal Aunt     breast & uterine    Current outpatient prescriptions:  .  amLODipine (NORVASC) 10 MG tablet, Take 1 tablet (10 mg total) by mouth daily., Disp: 90 tablet, Rfl: 3 .  ibuprofen (ADVIL,MOTRIN) 200 MG tablet, OTC as directed , Disp: , Rfl:  .  levothyroxine (SYNTHROID, LEVOTHROID) 75 MCG tablet, Take 1 tablet (75 mcg total) by mouth daily., Disp: 90 tablet, Rfl: 3 .  meloxicam (MOBIC) 15 MG tablet, Take 1 tablet (15 mg total) by mouth daily., Disp: 90 tablet, Rfl: 3 .  venlafaxine XR (EFFEXOR-XR) 150 MG 24 hr capsule, TAKE ONE CAPSULE BY MOUTH EVERY DAY, Disp: 90 capsule, Rfl: 1  The PMH, PSH, Social History, Family History, Medications, and allergies have been reviewed in [facility], and have been updated if relevant.   Review of Systems       Review of Systems  Constitutional: Negative.   HENT: Negative.   Eyes: Negative.   Respiratory: Negative.   Cardiovascular: Negative.   Gastrointestinal: Negative.   Endocrine: Negative.   Genitourinary: Negative.   Musculoskeletal: Negative.   Skin: Negative.   Allergic/Immunologic: Negative.  Neurological: Negative.   Hematological: Negative.   Psychiatric/Behavioral: Negative.   All other systems reviewed and are negative.     Physical Exam BP 138/72 mmHg  Pulse 80  Temp(Src) 98 F (36.7 C) (Oral)  Ht 5' (1.524 m)  Wt 169 lb 8 oz (76.885 kg)  BMI 33.10 kg/m2  SpO2 95%  General:  Well-developed,well-nourished,in no acute distress; alert,appropriate and cooperative throughout examination Head:  normocephalic and atraumatic.   Eyes:  vision grossly intact, pupils equal, pupils round, and pupils reactive to light.   Ears:  R ear normal and L ear normal.   Nose:  no external deformity.   Mouth:  good dentition.   Neck:  No deformities, masses, or tenderness  noted. Breasts:  No mass, nodules, thickening, tenderness, bulging, retraction, inflamation, nipple discharge or skin changes noted.   Lungs:  Normal respiratory effort, chest expands symmetrically. Lungs are clear to auscultation, no crackles or wheezes. Heart:  Normal rate and regular rhythm. S1 and S2 normal without gallop, murmur, click, rub or other extra sounds. Abdomen:  Bowel sounds positive,abdomen soft and non-tender without masses, organomegaly or hernias noted. Rectal:  no external abnormalities.   Genitalia:  Pelvic Exam:        External: normal female genitalia without lesions or masses        Vagina: normal without lesions or masses        Cervix: normal without lesions or masses        Adnexa: normal bimanual exam without masses or fullness        Uterus: normal by palpation        Pap smear: performed Msk:  No deformity or scoliosis noted of thoracic or lumbar spine.   Extremities:  No clubbing, cyanosis, edema, or deformity noted with normal full range of motion of all joints.   Neurologic:  alert & oriented X3 and gait normal.   Skin:  Intact without suspicious lesions or rashes Cervical Nodes:  No lymphadenopathy noted Axillary Nodes:  No palpable lymphadenopathy Psych:  Cognition and judgment appear intact. Alert and cooperative with normal attention span and concentration. No apparent delusions, illusions, hallucinations

## 2014-10-28 NOTE — Addendum Note (Signed)
Addended by: Modena Nunnery on: 10/28/2014 12:52 PM   Modules accepted: Orders

## 2014-10-28 NOTE — Assessment & Plan Note (Signed)
Deteriorated. Discussed tx options.  Refusing statin but willing to try zetia.  Rx given to pt.  Follow up labs in 8 weeks- orders entered.

## 2014-10-28 NOTE — Patient Instructions (Addendum)
Great to see you. We will call you with your nutritionist referral.  We are starting zetia 10 mg daily.  Please return for follow up labs in 8 weeks.

## 2014-10-28 NOTE — Assessment & Plan Note (Signed)
Well controlled on current rx. No changes made today. 

## 2014-10-28 NOTE — Progress Notes (Signed)
Pre visit review using our clinic review tool, if applicable. No additional management support is needed unless otherwise documented below in the visit note. 

## 2014-10-28 NOTE — Assessment & Plan Note (Signed)
Reviewed preventive care protocols, scheduled due services, and updated immunizations Discussed nutrition, exercise, diet, and healthy lifestyle.  Pap smear today.  IFOB ordered.  Declined zostavax.

## 2014-10-28 NOTE — Assessment & Plan Note (Signed)
Deteriorated. Discussed weight loss plan. She would like to work with nutritionist  Referral placed.

## 2014-10-29 ENCOUNTER — Encounter: Payer: Self-pay | Admitting: Family Medicine

## 2014-10-29 LAB — CYTOLOGY - PAP

## 2014-10-29 MED ORDER — PRAVASTATIN SODIUM 20 MG PO TABS: 20.0000 mg | ORAL_TABLET | Freq: Every day | ORAL | Status: DC

## 2014-10-29 NOTE — Addendum Note (Signed)
Addended by: Lucille Passy on: 10/29/2014 05:32 PM   Modules accepted: Orders, Medications, SmartSet

## 2014-11-01 ENCOUNTER — Encounter: Payer: Self-pay | Admitting: *Deleted

## 2014-11-01 MED ORDER — LEVOTHYROXINE SODIUM 75 MCG PO TABS: 75.0000 ug | ORAL_TABLET | Freq: Every day | ORAL | Status: DC

## 2014-11-01 MED ORDER — VENLAFAXINE HCL ER 150 MG PO CP24: 150.0000 mg | ORAL_CAPSULE | Freq: Every day | ORAL | Status: DC

## 2014-11-01 MED ORDER — AMLODIPINE BESYLATE 10 MG PO TABS: 10.0000 mg | ORAL_TABLET | Freq: Every day | ORAL | Status: DC

## 2014-11-01 MED ORDER — PRAVASTATIN SODIUM 20 MG PO TABS: 20.0000 mg | ORAL_TABLET | Freq: Every day | ORAL | Status: DC

## 2014-11-01 NOTE — Telephone Encounter (Signed)
Spoke to pt and informed her Rx were printed

## 2014-12-09 ENCOUNTER — Encounter: Payer: Self-pay | Admitting: Internal Medicine

## 2014-12-09 ENCOUNTER — Ambulatory Visit (INDEPENDENT_AMBULATORY_CARE_PROVIDER_SITE_OTHER): Payer: 59 | Admitting: Internal Medicine

## 2014-12-09 VITALS — BP 140/76 | HR 78 | Temp 98.2°F | Wt 172.0 lb

## 2014-12-09 DIAGNOSIS — J209 Acute bronchitis, unspecified: Secondary | ICD-10-CM | POA: Diagnosis not present

## 2014-12-09 MED ORDER — AZITHROMYCIN 250 MG PO TABS: ORAL_TABLET | ORAL | Status: DC

## 2014-12-09 MED ORDER — HYDROCODONE-HOMATROPINE 5-1.5 MG/5ML PO SYRP: 5.0000 mL | ORAL_SOLUTION | Freq: Three times a day (TID) | ORAL | Status: DC | PRN

## 2014-12-09 MED ORDER — ALBUTEROL SULFATE HFA 108 (90 BASE) MCG/ACT IN AERS
2.0000 | INHALATION_SPRAY | Freq: Four times a day (QID) | RESPIRATORY_TRACT | Status: DC | PRN
Start: 2014-12-09 — End: 2014-12-10

## 2014-12-09 NOTE — Progress Notes (Signed)
HPI  Pt presents to the clinic today with c/o cough and chest congestion. This started 1-2 weeks ago. The cough is productive at times of green/brown mucous. She has coughed so much that her chest and back hurts. She denies shortness of breath or wheezing. She denies fever but has had chills and body aches. She has tried Mucinex D, Tussin and Delsym without any relief. She has no history of allergies or breathing problems. She has not had sick contacts that she is aware of. She is a current smoker.  Review of Systems      Past Medical History  Diagnosis Date  . Hypertension   . Thyroid disease     Family History  Problem Relation Age of Onset  . COPD Mother   . Diabetes Sister   . Cancer Maternal Aunt     breast & uterine    Social History   Social History  . Marital Status: Married    Spouse Name: N/A  . Number of Children: 2  . Years of Education: N/A   Occupational History  . Not on file.   Social History Main Topics  . Smoking status: Current Every Day Smoker  . Smokeless tobacco: Not on file  . Alcohol Use: Not on file  . Drug Use: Not on file  . Sexual Activity: Not on file   Other Topics Concern  . Not on file   Social History Narrative   Teller at [employer]   One grandson    No Known Allergies   Constitutional: Denies headache, fatigue, fever or abrupt weight changes.  HEENT:  Denies eye redness, eye pain, pressure behind the eyes, facial pain, nasal congestion, ear pain, ringing in the ears, wax buildup, runny nose or sore throat. Respiratory: Positive cough and shortness of breath. Denies difficulty breathing.  Cardiovascular: Denies chest pain, chest tightness, palpitations or swelling in the hands or feet.   No other specific complaints in a complete review of systems (except as listed in HPI above).  Objective:   BP 140/76 mmHg  Pulse 78  Temp(Src) 98.2 F (36.8 C) (Oral)  Wt 172 lb (78.019 kg)  SpO2 97%  Wt Readings  from Last 3 Encounters:  12/09/14 172 lb (78.019 kg)  10/28/14 169 lb 8 oz (76.885 kg)  03/03/14 166 lb 12 oz (75.637 kg)     General: Appears her stated age, well developed, well nourished in NAD. HEENT: Head: normal shape and size, no sinus tenderness noted; Eyes: sclera white, no icterus, conjunctiva pink; Ears: Tm's gray and intact, normal light reflex; Nose: mucosa pink and moist, septum midline; Throat/Mouth: Teeth present, mucosa pink and moist, no exudate noted, no lesions or ulcerations noted.  Neck: No cervical lymphadenopathy.  Cardiovascular: Normal rate and rhythm. S1,S2 noted.  No murmur, rubs or gallops noted.  Pulmonary/Chest: Normal effort and scattered rhonchi throughout. Intermittent inspiratory wheeze noted. No respiratory distress.     Assessment & Plan:   Acute Bronchitis:  Get some rest and drink plenty of water eRx for Albuterol inhaler eRx for Azithromax x 5 days eRx for Hycodan cough syrup Try to stop smoking  RTC as needed or if symptoms persist.

## 2014-12-09 NOTE — Progress Notes (Signed)
Pre visit review using our clinic review tool, if applicable. No additional management support is needed unless otherwise documented below in the visit note. 

## 2014-12-09 NOTE — Patient Instructions (Signed)

## 2014-12-10 ENCOUNTER — Other Ambulatory Visit: Payer: Self-pay | Admitting: Internal Medicine

## 2014-12-10 ENCOUNTER — Telehealth: Payer: Self-pay | Admitting: *Deleted

## 2014-12-10 MED ORDER — ALBUTEROL SULFATE HFA 108 (90 BASE) MCG/ACT IN AERS: 1.0000 | INHALATION_SPRAY | Freq: Four times a day (QID) | RESPIRATORY_TRACT | Status: DC | PRN

## 2014-12-10 NOTE — Telephone Encounter (Signed)
Pt left voicemail at Triage. Pt said the insurance will not cover her albuterol inhaler Webb Silversmith, NP prescribed when she was seen. Pt said pharmacy told her that her insurance will cover Ventolin HFA, pt wanted to see if you could prescribe this instead, if so please send to [facility]. [facility] [address], pt request call back to know what Rollene Fare is going to do

## 2014-12-10 NOTE — Telephone Encounter (Signed)
RX sent to pharmacy  

## 2014-12-14 ENCOUNTER — Telehealth: Payer: Self-pay

## 2014-12-14 NOTE — Telephone Encounter (Signed)
PA was submitted through [email or web address] 12/10/2014---was approved from 12/11/2014-12/2015---however a new inhaler was sent in before PA approval

## 2014-12-27 ENCOUNTER — Encounter: Payer: Self-pay | Admitting: Internal Medicine

## 2014-12-27 ENCOUNTER — Other Ambulatory Visit: Payer: Self-pay | Admitting: *Deleted

## 2014-12-27 MED ORDER — MELOXICAM 15 MG PO TABS: 15.0000 mg | ORAL_TABLET | Freq: Every day | ORAL | Status: DC

## 2014-12-27 MED ORDER — AMLODIPINE BESYLATE 10 MG PO TABS: 10.0000 mg | ORAL_TABLET | Freq: Every day | ORAL | Status: DC

## 2014-12-27 MED ORDER — VENLAFAXINE HCL ER 150 MG PO CP24: 150.0000 mg | ORAL_CAPSULE | Freq: Every day | ORAL | Status: DC

## 2014-12-27 MED ORDER — LEVOTHYROXINE SODIUM 75 MCG PO TABS: 75.0000 ug | ORAL_TABLET | Freq: Every day | ORAL | Status: DC

## 2014-12-27 MED ORDER — PRAVASTATIN SODIUM 20 MG PO TABS: 20.0000 mg | ORAL_TABLET | Freq: Every day | ORAL | Status: DC

## 2014-12-27 NOTE — Telephone Encounter (Signed)
Pt's husband left message at triage requesting stronger abx. See my chart message

## 2014-12-28 ENCOUNTER — Other Ambulatory Visit: Payer: Self-pay | Admitting: Internal Medicine

## 2014-12-28 MED ORDER — DOXYCYCLINE HYCLATE 100 MG PO TABS: 100.0000 mg | ORAL_TABLET | Freq: Two times a day (BID) | ORAL | Status: DC

## 2015-01-07 ENCOUNTER — Other Ambulatory Visit: Payer: 59

## 2015-04-08 ENCOUNTER — Telehealth: Payer: Self-pay

## 2015-04-08 NOTE — Telephone Encounter (Signed)
Mrs Glasby called back; I explained to pt that we did not get electronic submission from [place] and do not know how [facility] received denial from Dr Deborra Medina; pt said she is aware that she had refill on hold at [facility] and will ck back after first of year; pts ins is changing and she thinks she may have to change to [facility] affilitated PCP. Pt would like cb to explain why [facility] was told that she is not a pt at [facility]. I apologized again and said that it must be a computer error but would send note to office manager. Pt voiced understanding.

## 2015-04-08 NOTE — Telephone Encounter (Signed)
Mr Fragoso left v/m upset because [facility] was told [facility] pt was not pt at [facility]; spoke with Ronalee Belts at [facility] and electronic rx was sent on 04/06/15 to Dr Deborra Medina and got refusal back from Dr Deborra Medina for refill; do not see request for refill in pts chart. Ronalee Belts said pt had refill for pravastatin on hold and pt is aware rx is ready for pickup. Called home #, no answer and no v/m; called pts cell phone and left v/m requesting cb.

## 2015-11-11 ENCOUNTER — Other Ambulatory Visit: Payer: Self-pay | Admitting: Family Medicine

## 2015-11-11 DIAGNOSIS — Z1231 Encounter for screening mammogram for malignant neoplasm of breast: Secondary | ICD-10-CM

## 2015-11-28 ENCOUNTER — Ambulatory Visit: Payer: Self-pay

## 2017-09-27 ENCOUNTER — Other Ambulatory Visit: Payer: Self-pay | Admitting: Family Medicine

## 2017-09-27 DIAGNOSIS — Z1231 Encounter for screening mammogram for malignant neoplasm of breast: Secondary | ICD-10-CM

## 2017-11-11 ENCOUNTER — Ambulatory Visit
Admission: RE | Admit: 2017-11-11 | Discharge: 2017-11-11 | Disposition: A | Discharge status: Home / Self Care | Payer: BLUE CROSS/BLUE SHIELD | Source: Ambulatory Visit | Attending: Family Medicine | Admitting: Family Medicine

## 2017-11-11 DIAGNOSIS — Z1231 Encounter for screening mammogram for malignant neoplasm of breast: Secondary | ICD-10-CM

## 2018-08-18 ENCOUNTER — Telehealth: Payer: Self-pay | Admitting: Family Medicine

## 2018-08-18 NOTE — Telephone Encounter (Signed)
Called and could not leave vm. Calling to verify PCP. Patient has not been seen since 2016

## 2019-05-18 ENCOUNTER — Ambulatory Visit: Payer: Self-pay

## 2019-07-28 ENCOUNTER — Ambulatory Visit: Payer: Medicare HMO | Admitting: Dermatology

## 2019-07-28 ENCOUNTER — Other Ambulatory Visit: Payer: Self-pay

## 2019-07-28 DIAGNOSIS — D235 Other benign neoplasm of skin of trunk: Secondary | ICD-10-CM

## 2019-07-28 DIAGNOSIS — D1800 Hemangioma unspecified site: Secondary | ICD-10-CM

## 2019-07-28 DIAGNOSIS — L820 Inflamed seborrheic keratosis: Secondary | ICD-10-CM

## 2019-07-28 DIAGNOSIS — L720 Epidermal cyst: Secondary | ICD-10-CM

## 2019-07-28 DIAGNOSIS — L578 Other skin changes due to chronic exposure to nonionizing radiation: Secondary | ICD-10-CM

## 2019-07-28 DIAGNOSIS — D225 Melanocytic nevi of trunk: Secondary | ICD-10-CM

## 2019-07-28 DIAGNOSIS — D229 Melanocytic nevi, unspecified: Secondary | ICD-10-CM

## 2019-07-28 DIAGNOSIS — L708 Other acne: Secondary | ICD-10-CM | POA: Diagnosis not present

## 2019-07-28 DIAGNOSIS — L738 Other specified follicular disorders: Secondary | ICD-10-CM

## 2019-07-28 DIAGNOSIS — L821 Other seborrheic keratosis: Secondary | ICD-10-CM

## 2019-07-28 NOTE — Progress Notes (Signed)
   Follow-Up Visit   Subjective  Kaylee Pope is a 64 y.o. female who presents for the following: Skin Problem.  Spots on the back that patient's husband thinks she needs checked. They have been there for a while and has some itching at back.  No history skin cancer.   Patient also has some bumps on the face she would like checked.   The following portions of the chart were reviewed this encounter and updated as appropriate:      Review of Systems:  No other skin or systemic complaints except as noted in HPI or Assessment and Plan.  Objective  Well appearing patient in no apparent distress; mood and affect are within normal limits.  A focused examination was performed including back. Relevant physical exam findings are noted in the Assessment and Plan.  Objective  Left Spinal Mid Upper Back, R nasolabial fold: Dilated pore/open comedone  Objective  Right Shoulder - Posterior: Erythematous keratotic or waxy stuck-on papule.  Irritated by bra.  Objective  Periocular: Tiny firm white papule(s).   Objective  Left Spinal Lower Back: 0.3cm medium dark brown macule  Objective  face: Small yellow papules with a central dell.    Assessment & Plan  Dilated pore of [place]iner of back Left Spinal Mid Upper Back, R nasolabial fold  Extracted today with comedone extractor to left spinal mid upper back  Acne/Milia surgery - Left Spinal Mid Upper Back, R nasolabial fold Procedure risks and benefits were discussed with the patient and verbal consent was obtained. Following prep of the skin on the back with an alcohol swab, extraction of comedon was performed with a comedone extractor. Vaseline ointment was applied to each site. The patient tolerated the procedure well.  Inflamed seborrheic keratosis Right Shoulder - Posterior  Destruction of lesion - Right Shoulder - Posterior  Destruction method: cryotherapy   Informed consent: discussed and consent obtained   Lesion destroyed  using liquid nitrogen: Yes   Region frozen until ice ball extended beyond lesion: Yes   Outcome: patient tolerated procedure well with no complications   Post-procedure details: wound care instructions given    Milia Periocular  Benign, observe.  Recommend Differin 0.1% or Effaclar gel to Aa's qd.  Recommend salicylic acid cleanser for face avoiding eyes. Sunscreen QAM to face  Nevus Left Spinal Lower Back  Benign-appearing.  Observation.  Call clinic for new or changing moles.  Recommend daily use of broad spectrum spf 30+ sunscreen to sun-exposed areas.    Sebaceous hyperplasia face  Benign, observe.     Hemangiomas - Red papules back - Discussed benign nature - Observe - Call for any changes  Actinic Damage - diffuse scaly erythematous macules with underlying dyspigmentation - Recommend daily broad spectrum sunscreen SPF 30+ to sun-exposed areas, reapply every 2 hours as needed.  - Call for new or changing lesions.  Seborrheic Keratoses - Stuck-on, waxy, tan-brown papules and plaques  - Discussed benign etiology and prognosis. - Observe - Call for any changes  Return if symptoms worsen or fail to improve.  Graciella Belton, RMA, am acting as scribe for Brendolyn Patty, MD .  Documentation: I have reviewed the above documentation for accuracy and completeness, and I agree with the above.  Brendolyn Patty, MD

## 2019-07-28 NOTE — Patient Instructions (Addendum)
Cryotherapy Aftercare  . Wash gently with soap and water everyday.   Marland Kitchen Apply Vaseline and Band-Aid daily until healed.  Recommend daily broad spectrum sunscreen SPF 30+ to sun-exposed areas, reapply every 2 hours as needed. Call for new or changing lesions.  Recommend Differin 0.1% or Effaclar (adapalene) gel to affected areas for face.  Recommend salicylic acid cleanser for face avoiding eyes.   Hemangiomas - Red papules - Discussed benign nature - Observe - Call for any changes  Seborrheic Keratosis  What causes seborrheic keratoses? Seborrheic keratoses are harmless, common skin growths that first appear during adult life.  As time goes by, more growths appear.  Some people may develop a large number of them.  Seborrheic keratoses appear on both covered and uncovered body parts.  They are not caused by sunlight.  The tendency to develop seborrheic keratoses can be inherited.  They vary in color from skin-colored to gray, brown, or even black.  They can be either smooth or have a rough, warty surface.   Seborrheic keratoses are superficial and look as if they were stuck on the skin.  Under the microscope this type of keratosis looks like layers upon layers of skin.  That is why at times the top layer may seem to fall off, but the rest of the growth remains and re-grows.    Treatment Seborrheic keratoses do not need to be treated, but can easily be removed in the office.  Seborrheic keratoses often cause symptoms when they rub on clothing or jewelry.  Lesions can be in the way of shaving.  If they become inflamed, they can cause itching, soreness, or burning.  Removal of a seborrheic keratosis can be accomplished by freezing, burning, or surgery. If any spot bleeds, scabs, or grows rapidly, please return to have it checked, as these can be an indication of a skin cancer.

## 2020-03-07 ENCOUNTER — Ambulatory Visit: Payer: Medicare HMO

## 2020-08-03 ENCOUNTER — Telehealth: Payer: Self-pay | Admitting: *Deleted

## 2020-08-03 DIAGNOSIS — Z122 Encounter for screening for malignant neoplasm of respiratory organs: Secondary | ICD-10-CM

## 2020-08-03 DIAGNOSIS — Z87891 Personal history of nicotine dependence: Secondary | ICD-10-CM

## 2020-08-03 DIAGNOSIS — F17200 Nicotine dependence, unspecified, uncomplicated: Secondary | ICD-10-CM

## 2020-08-03 NOTE — Telephone Encounter (Signed)
Received referral for initial lung cancer screening scan. Contacted patient and obtained smoking history,(current, 75 pack year) as well as answering questions related to screening process. Patient denies signs of lung cancer such as weight loss or hemoptysis. Patient denies comorbidity that would prevent curative treatment if lung cancer were found. Patient is scheduled for shared decision making visit and CT scan on 08/24/20 at 11am.

## 2020-08-24 ENCOUNTER — Other Ambulatory Visit: Payer: Self-pay

## 2020-08-24 ENCOUNTER — Ambulatory Visit
Admission: RE | Admit: 2020-08-24 | Discharge: 2020-08-24 | Disposition: A | Discharge status: Home / Self Care | Payer: Medicare HMO | Source: Ambulatory Visit | Attending: Oncology | Admitting: Oncology

## 2020-08-24 ENCOUNTER — Inpatient Hospital Stay: Payer: Medicare HMO | Attending: Oncology | Admitting: Hospice and Palliative Medicine

## 2020-08-24 ENCOUNTER — Encounter: Payer: Self-pay | Admitting: Hospice and Palliative Medicine

## 2020-08-24 DIAGNOSIS — Z87891 Personal history of nicotine dependence: Secondary | ICD-10-CM | POA: Diagnosis not present

## 2020-08-24 DIAGNOSIS — Z122 Encounter for screening for malignant neoplasm of respiratory organs: Secondary | ICD-10-CM

## 2020-08-24 DIAGNOSIS — F17200 Nicotine dependence, unspecified, uncomplicated: Secondary | ICD-10-CM | POA: Diagnosis present

## 2020-08-24 NOTE — Progress Notes (Signed)
Virtual Visit via Video Note  I connected with@ on 08/24/20 at@ by a video enabled telemedicine application and verified that I am speaking with the correct person using two identifiers.   I discussed the limitations of evaluation and management by telemedicine and the availability of in person appointments. The patient expressed understanding and agreed to proceed.  Location: Patient: [facility] Provider: Clinic   In accordance with CMS guidelines, patient has met eligibility criteria including age, absence of signs or symptoms of lung cancer.  Social History   Tobacco Use  . Smoking status: Current Every Day Smoker    Packs/day: 1.50    Years: 50.00    Pack years: 75.00    Types: Cigarettes      A shared decision-making session was conducted prior to the performance of CT scan. This includes one or more decision aids, includes benefits and harms of screening, follow-up diagnostic testing, over-diagnosis, false positive rate, and total radiation exposure.   Counseling on the importance of adherence to annual lung cancer LDCT screening, impact of co-morbidities, and ability or willingness to undergo diagnosis and treatment is imperative for compliance of the program.   Counseling on the importance of continued smoking cessation for former smokers; the importance of smoking cessation for current smokers, and information about tobacco cessation interventions have been given to patient including [place] and [facility] quit [place] programs.   Written order for lung cancer screening with LDCT has been given to the patient and any and all questions have been answered to the best of my abilities.    Yearly follow up will be coordinated by Burgess Estelle, Thoracic Navigator.  Time Total: 15 minutes  Visit consisted of counseling and education dealing with complex health screening. Greater than 50%  of this time was spent counseling and coordinating care related to the above assessment and  plan.  Signed by: Altha Harm, PhD, NP-C

## 2020-08-26 ENCOUNTER — Telehealth: Payer: Self-pay | Admitting: *Deleted

## 2020-08-26 NOTE — Telephone Encounter (Addendum)
Notified patient of LDCT lung cancer screening program results with recommendation for 12 month follow up imaging. Also notified of incidental findings noted below and is encouraged to discuss further with PCP who will receive a copy of this note and/or the CT report. Patient verbalizes understanding. Patient knows to expect follow up of the aneurysmal area of the thoracic aorta.   IMPRESSION: 1. Lung-RADS 2S, benign appearance or behavior. Continue annual screening with low-dose chest CT without contrast in 12 months. 2. The "S" modifier above refers to potentially clinically significant non lung cancer related findings. 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 Recommend semi-annual imaging followup by CTA or MRA and referral to cardiothoracic surgery if not already obtained. This recommendation follows 2010 ACCF/AHA/AATS/ACR/ASA/SCA/SCAI/SIR/STS/SVM Guidelines for the Diagnosis and Management of Patients With Thoracic Aortic Disease. Circulation. 2010; 121: [id number]. Aortic aneurysm NOS (ICD10-I71.9). 3. Mild diffuse bronchial wall thickening with mild to moderate centrilobular and mild paraseptal emphysema; imaging findings suggestive of underlying COPD. 4. There are calcifications of the aortic valve. Echocardiographic correlation for evaluation of potential valvular dysfunction may be warranted if clinically indicated.  Aortic Atherosclerosis (ICD10-I70.0) and Emphysema (ICD10-J43.9).  08/30/20 spoke with Amy at Dr. Barbarann Ehlers office to confirm that Dr. Kary Kos is aware of the aneurysm noted on CT with recommendation for further evaluation.

## 2020-09-23 ENCOUNTER — Other Ambulatory Visit: Payer: Self-pay

## 2020-09-23 ENCOUNTER — Ambulatory Visit: Payer: Medicare HMO | Admitting: Vascular Surgery

## 2020-09-23 ENCOUNTER — Encounter: Payer: Self-pay | Admitting: Vascular Surgery

## 2020-09-23 VITALS — BP 137/83 | HR 88 | Temp 98.1°F | Resp 20 | Ht 60.0 in | Wt 174.0 lb

## 2020-09-23 DIAGNOSIS — I712 Thoracic aortic aneurysm, without rupture, unspecified: Secondary | ICD-10-CM

## 2020-09-23 NOTE — Progress Notes (Signed)
Patient ID: Kaylee Pope, female   DOB: Jun 29, 1955, 65 y.o.   MRN: [id number]  Reason for Consult: New Patient (Initial Visit)   Referred by Maryland Pink, MD  Subjective:     HPI:  Kaylee Pope is a 65 y.o. female known to me from previous surgery on her husband which included left carotid endarterectomy for which she did well.  She is a lifetime smoker down to 18 cigarettes now previously smoked 2 packs daily.  She has no personal family history of aneurysm disease.  She denies any stroke TIA or amaurosis.  She walks without limitation.  Recent underwent CT scanning for screening cancer found to have thoracic aortic aneurysm which she is unaware of.  She does occasionally have back pain when she is lying down this is been present for approximately 9 months and is not exacerbated with any activity and has not worsened throughout that time.  She has never had any vascular surgery in the past.  Past Medical History:  Diagnosis Date   Allergy    Hypertension    Thyroid disease    Family History  Problem Relation Age of Onset   COPD Mother    Diabetes Sister    Cancer Maternal Aunt        breast & uterine   Breast cancer Maternal Aunt 60   Past Surgical History:  Procedure Laterality Date   TONSILLECTOMY     age 25    Short Social History:  Social History   Tobacco Use   Smoking status: Every Day    Packs/day: 1.00    Years: 50.00    Pack years: 50.00    Types: Cigarettes   Smokeless tobacco: Never  Substance Use Topics   Alcohol use: Not on file    No Known Allergies  Current Outpatient Medications  Medication Sig Dispense Refill   albuterol (VENTOLIN HFA) 108 (90 BASE) MCG/ACT inhaler Inhale 1-2 puffs into the lungs every 6 (six) hours as needed for wheezing or shortness of breath. 1 Inhaler 0   amLODipine (NORVASC) 10 MG tablet Take 1 tablet (10 mg total) by mouth daily. 90 tablet 0   fluticasone (FLONASE) 50 MCG/ACT nasal spray Place 2 sprays into both  nostrils daily.     ibuprofen (ADVIL,MOTRIN) 200 MG tablet OTC as directed      levothyroxine (SYNTHROID, LEVOTHROID) 75 MCG tablet Take 1 tablet (75 mcg total) by mouth daily. 90 tablet 0   omeprazole (PRILOSEC) 20 MG capsule Take 1 capsule by mouth daily.     pravastatin (PRAVACHOL) 20 MG tablet Take 1 tablet (20 mg total) by mouth daily. 90 tablet 0   TRELEGY ELLIPTA 100-62.5-25 MCG/INH AEPB Inhale 1 puff into the lungs daily.     venlafaxine XR (EFFEXOR-XR) 150 MG 24 hr capsule Take 1 capsule (150 mg total) by mouth daily. 90 capsule 0   No current facility-administered medications for this visit.    Review of Systems  Constitutional:  Constitutional negative. HENT: HENT negative.  Eyes: Eyes negative.  Respiratory: Respiratory negative.  Cardiovascular: Cardiovascular negative.  GI: Gastrointestinal negative.  Musculoskeletal: Positive for back pain.  Skin: Skin negative.  Neurological: Neurological negative. Hematologic: Hematologic/lymphatic negative.  Psychiatric: Psychiatric negative.       Objective:  Objective   Vitals:   09/23/20 0920  BP: 137/83  Pulse: 88  Resp: 20  Temp: 98.1 F (36.7 C)  SpO2: 95%  Weight: 174 lb (78.9 kg)  Height: 5' (1.524 m)  Body mass index is 33.98 kg/m.  Physical Exam HENT:     Head: Normocephalic.     Nose:     Comments: Wearing a mask Eyes:     Pupils: Pupils are equal, round, and reactive to light.  Cardiovascular:     Rate and Rhythm: Normal rate.     Pulses: Normal pulses.  Pulmonary:     Effort: Pulmonary effort is normal.  Abdominal:     General: Abdomen is flat.     Palpations: Abdomen is soft. There is no mass.  Musculoskeletal:        General: Normal range of motion.     Cervical back: Normal range of motion and neck supple.     Right lower leg: No edema.     Left lower leg: No edema.  Skin:    General: Skin is warm.     Capillary Refill: Capillary refill takes less than 2 seconds.  Neurological:      General: No focal deficit present.     Mental Status: She is alert and oriented to person, place, and time.  Psychiatric:        Mood and Affect: Mood normal.        Behavior: Behavior normal.        Thought Content: Thought content normal.        Judgment: Judgment normal.    Data: CT IMPRESSION: 1. Lung-RADS 2S, benign appearance or behavior. Continue annual screening with low-dose chest CT without contrast in 12 months. 2. The "S" modifier above refers to potentially clinically significant non lung cancer related findings. 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 Recommend semi-annual imaging followup by CTA or MRA and referral to cardiothoracic surgery if not already obtained. This recommendation follows 2010 ACCF/AHA/AATS/ACR/ASA/SCA/SCAI/SIR/STS/SVM Guidelines for the Diagnosis and Management of Patients With Thoracic Aortic Disease. Circulation. 2010; 121: E266-e369TAA. Aortic aneurysm NOS (ICD10-I71.9). 3. Mild diffuse bronchial wall thickening with mild to moderate centrilobular and mild paraseptal emphysema; imaging findings suggestive of underlying COPD. 4. There are calcifications of the aortic valve. Echocardiographic correlation for evaluation of potential valvular dysfunction may be warranted if clinically indicated.     Assessment/Plan:     65 year old female found to have 5.1 cm thoracic aneurysm on screening lung cancer study.  This was noncontrasted.  I discussed with the patient the likely continued growth of this over time however we only have one data point currently.  We did review the CT scan together in the presence of her husband and they demonstrate good understanding of this.  Study was performed in May.  We will get her back at the end of December with CT angio of her chest  abdomen pelvis to evaluate the rest of her aorta and her access.  I discussed the need for urgent smoking cessation and she states that she is trying.  We discussed the signs symptoms of rupture and she demonstrates good understanding.  As long she has no issues we will see her back with CT in a few months     Waynetta Sandy MD [facility]

## 2020-10-06 ENCOUNTER — Institutional Professional Consult (permissible substitution) (INDEPENDENT_AMBULATORY_CARE_PROVIDER_SITE_OTHER): Payer: Medicare HMO | Admitting: Surgery

## 2020-10-06 ENCOUNTER — Other Ambulatory Visit: Payer: Self-pay

## 2020-10-06 ENCOUNTER — Encounter: Payer: Self-pay | Admitting: Surgery

## 2020-10-06 VITALS — BP 150/90 | HR 92 | Resp 20 | Ht 60.0 in | Wt 178.0 lb

## 2020-10-06 DIAGNOSIS — I7121 Aneurysm of the ascending aorta, without rupture: Secondary | ICD-10-CM

## 2020-10-06 DIAGNOSIS — I712 Thoracic aortic aneurysm, without rupture: Secondary | ICD-10-CM | POA: Diagnosis not present

## 2020-10-06 NOTE — Progress Notes (Signed)
Cardiothoracic Surgery Consultation  PCP is Maryland Pink, MD Referring Provider is Maryland Pink, MD  Chief Complaint  Patient presents with   Thoracic Aortic Aneurysm    Surgical consult, Chest CT 08/24/20    HPI:  The patient is a 65 year old woman with a history of hypertension and heavy smoking who underwent a lung cancer screening CT on 08/24/2020.  This did not show any lung lesions of concern but did show a 5.1 x 5.0 cm descending thoracic aortic aneurysm.  She does report some dull back pain when she is lying down that is been present over the past year.  It is not made worse by any activity and not associated with exertion.  She denies any exertional chest discomfort.  She does have chronic exertional shortness of breath which she ascribes to her COPD from smoking.  She has smoked 2 packs/day for years and says she is now down to less than a pack a day.  There is no family history of aortic aneurysm, aortic dissection, or connective tissue disorder.  She saw Dr. Servando Snare for consultation concerning this aneurysm on 09/23/2020. Past Medical History:  Diagnosis Date   Allergy    Hypertension    Thyroid disease     Past Surgical History:  Procedure Laterality Date   TONSILLECTOMY     age 41    Family History  Problem Relation Age of Onset   COPD Mother    Diabetes Sister    Cancer Maternal Aunt        breast & uterine   Breast cancer Maternal Aunt 60    Social History Social History   Tobacco Use   Smoking status: Every Day    Packs/day: 1.00    Years: 50.00    Pack years: 50.00    Types: Cigarettes   Smokeless tobacco: Never  Vaping Use   Vaping Use: Never used    Current Outpatient Medications  Medication Sig Dispense Refill   albuterol (VENTOLIN HFA) 108 (90 BASE) MCG/ACT inhaler Inhale 1-2 puffs into the lungs every 6 (six) hours as needed for wheezing or shortness of breath. 1 Inhaler 0   amLODipine (NORVASC) 10 MG tablet Take 1 tablet (10 mg  total) by mouth daily. 90 tablet 0   aspirin EC 81 MG tablet Take 81 mg by mouth daily. Swallow whole.     fluticasone (FLONASE) 50 MCG/ACT nasal spray Place 2 sprays into both nostrils daily.     ibuprofen (ADVIL,MOTRIN) 200 MG tablet OTC as directed      levothyroxine (SYNTHROID, LEVOTHROID) 75 MCG tablet Take 1 tablet (75 mcg total) by mouth daily. 90 tablet 0   omeprazole (PRILOSEC) 20 MG capsule Take 1 capsule by mouth daily.     pravastatin (PRAVACHOL) 20 MG tablet Take 1 tablet (20 mg total) by mouth daily. 90 tablet 0   TRELEGY ELLIPTA 100-62.5-25 MCG/INH AEPB Inhale 1 puff into the lungs daily.     venlafaxine XR (EFFEXOR-XR) 150 MG 24 hr capsule Take 1 capsule (150 mg total) by mouth daily. 90 capsule 0   No current facility-administered medications for this visit.    No Known Allergies  Review of Systems  Constitutional:  Negative for activity change and fatigue.  HENT: Negative.    Eyes: Negative.   Respiratory:  Positive for shortness of breath. Negative for chest tightness.   Cardiovascular:  Positive for leg swelling. Negative for chest pain.  Gastrointestinal: Negative.   Endocrine: Negative.   Genitourinary: Negative.  Musculoskeletal:  Positive for back pain.  Skin: Negative.   Neurological:  Negative for dizziness, syncope, weakness and numbness.  Hematological: Negative.   Psychiatric/Behavioral: Negative.     BP (!) 150/90   Pulse 92   Resp 20   Ht 5' (1.524 m)   Wt 178 lb (80.7 kg)   SpO2 93% Comment: RA  BMI 34.76 kg/m  Physical Exam Constitutional:      Appearance: Normal appearance. She is obese.     Comments: BMI is 34.76 kg/m. 5 feet tall  HENT:     Head: Normocephalic and atraumatic.  Eyes:     Extraocular Movements: Extraocular movements intact.     Pupils: Pupils are equal, round, and reactive to light.  Neck:     Vascular: No carotid bruit.  Cardiovascular:     Rate and Rhythm: Normal rate and regular rhythm.     Pulses: Normal  pulses.     Heart sounds: Normal heart sounds. No murmur heard. Pulmonary:     Effort: Pulmonary effort is normal.     Breath sounds: Normal breath sounds.  Musculoskeletal:        General: No swelling.  Skin:    General: Skin is warm and dry.  Neurological:     General: No focal deficit present.     Mental Status: She is alert and oriented to person, place, and time.  Psychiatric:        Mood and Affect: Mood normal.        Behavior: Behavior normal.        Thought Content: Thought content normal.        Judgment: Judgment normal.     Diagnostic Tests:  Narrative & Impression  CLINICAL DATA:  66 year old female current smoker with 75 pack-year history of smoking. Lung cancer screening examination.   EXAM: CT CHEST WITHOUT CONTRAST LOW-DOSE FOR LUNG CANCER SCREENING   TECHNIQUE: Multidetector CT imaging of the chest was performed following the standard protocol without IV contrast.   COMPARISON:  No priors.   FINDINGS: Cardiovascular: Heart size is normal. There is no significant pericardial fluid, thickening or pericardial calcification. There is aortic atherosclerosis, as well as atherosclerosis of the great vessels of the mediastinum and the coronary arteries, including calcified atherosclerotic plaque in the left main, left anterior descending, left circumflex and right coronary arteries. Aneurysmal dilatation of the descending thoracic aorta (axial image 39 of series 2) measuring 5.1 x 5.0 cm. Mild calcifications of the aortic valve.   Mediastinum/Nodes: No pathologically enlarged mediastinal or hilar lymph nodes. Please note that accurate exclusion of hilar adenopathy is limited on noncontrast CT scans. Esophagus is unremarkable in appearance. No axillary lymphadenopathy.   Lungs/Pleura: Tiny pulmonary nodule in the right upper lobe near the apex (axial image 48 of series 3), with a volume derived mean diameter of 3.7 mm. No other larger more suspicious  appearing pulmonary nodules or masses are noted. No acute consolidative airspace disease. No pleural effusions. Mild diffuse bronchial wall thickening with mild to moderate centrilobular and mild paraseptal emphysema.   Upper Abdomen: Aortic atherosclerosis.   Musculoskeletal: There are no aggressive appearing lytic or blastic lesions noted in the visualized portions of the skeleton.   IMPRESSION: 1. Lung-RADS 2S, benign appearance or behavior. Continue annual screening with low-dose chest CT without contrast in 12 months. 2. The "S" modifier above refers to potentially clinically significant non lung cancer related findings. Specifically, there is aortic atherosclerosis, in addition to left main and 3 vessel  coronary artery disease, as well as aneurysmal dilatation of the descending thoracic aorta which measures up to 5.1 x 5.0 cm in diameter. Recommend semi-annual imaging followup by CTA or MRA and referral to cardiothoracic surgery if not already obtained. This recommendation follows 2010 ACCF/AHA/AATS/ACR/ASA/SCA/SCAI/SIR/STS/SVM Guidelines for the Diagnosis and Management of Patients With Thoracic Aortic Disease. Circulation. 2010; 121: E266-e369TAA. Aortic aneurysm NOS (ICD10-I71.9). 3. Mild diffuse bronchial wall thickening with mild to moderate centrilobular and mild paraseptal emphysema; imaging findings suggestive of underlying COPD. 4. There are calcifications of the aortic valve. Echocardiographic correlation for evaluation of potential valvular dysfunction may be warranted if clinically indicated.   Aortic Atherosclerosis (ICD10-I70.0) and Emphysema (ICD10-J43.9).     Electronically Signed   By: Vinnie Langton M.D.   On: 08/25/2020 15:46     Impression:  This 65 year old woman has a 5.1 cm descending thoracic aortic aneurysm that extends most of the length of her descending thoracic aorta although this is a noncontrasted study.  She has had no prior studies  so the timeline on this aneurysm is unknown.  This is still below the usual surgical threshold although given her relatively short stature I would probably not wait until she reached 6 cm.  Hopefully this could be treated with TEVAR.  I reviewed the CT scan again with the patient and her husband and Dr. Donzetta Matters had already reviewed it with them.  I answered all their questions.  She has a follow-up already arranged with Dr. Donzetta Matters and will have a CTA of the chest abdomen pelvis prior to that visit.  He will make the decision about further follow up, treatment and timing.  I do not think she needs to return to see me unless Dr. Donzetta Matters feels there is some issue requiring cardiothoracic assistance.  The patient and her husband are in complete agreement with that.  I stressed the importance of good blood pressure control in preventing further enlargement and acute aortic dissection.  I also stressed the importance of smoking cessation.  I advised her against doing any heavy lifting that might require a Valsalva maneuver and advised her against taking quinolone antibiotics that have been associated with enlargement of aneurysms.  She does have significant calcific atherosclerosis of her coronary arteries and I discussed the symptoms of coronary ischemia with her and her husband so they know what to watch out for.  She currently is not having any exertional chest discomfort or fatigue although she has some chronic shortness of breath that is likely related to her COPD.  I also reviewed the signs and symptoms of acute aortic dissection with them and told her to seek immediate medical attention if she develops any of those.  Plan:  She will follow-up with Dr. Donzetta Matters in a few months with a CTA of the chest, abdomen, and pelvis.  I spent 45 minutes performing this consultation and > 50% of this time was spent face to face counseling and coordinating the care of this patient's descending thoracic aortic aneurysm.   Gaye Pollack, MD [facility] [phone]

## 2020-11-25 ENCOUNTER — Other Ambulatory Visit: Payer: Self-pay | Admitting: *Deleted

## 2020-11-25 DIAGNOSIS — I712 Thoracic aortic aneurysm, without rupture, unspecified: Secondary | ICD-10-CM

## 2020-12-16 ENCOUNTER — Other Ambulatory Visit: Payer: Self-pay

## 2020-12-16 ENCOUNTER — Ambulatory Visit
Admission: RE | Admit: 2020-12-16 | Discharge: 2020-12-16 | Disposition: A | Discharge status: Home / Self Care | Payer: Medicare HMO | Source: Ambulatory Visit | Attending: Vascular Surgery | Admitting: Vascular Surgery

## 2020-12-16 DIAGNOSIS — I712 Thoracic aortic aneurysm, without rupture, unspecified: Secondary | ICD-10-CM

## 2020-12-16 LAB — POCT I-STAT CREATININE: Creatinine, Ser: 0.8 mg/dL (ref 0.44–1.00)

## 2020-12-16 MED ORDER — IOHEXOL 350 MG/ML SOLN
80.0000 mL | Freq: Once | INTRAVENOUS | Status: AC | PRN
  Administered 2020-12-16: 80 mL via INTRAVENOUS

## 2020-12-21 ENCOUNTER — Ambulatory Visit: Payer: Medicare HMO | Admitting: Vascular Surgery

## 2020-12-21 ENCOUNTER — Encounter: Payer: Self-pay | Admitting: Vascular Surgery

## 2020-12-21 ENCOUNTER — Other Ambulatory Visit: Payer: Self-pay

## 2020-12-21 VITALS — BP 156/90 | HR 76 | Temp 97.9°F | Resp 20 | Ht 60.0 in | Wt 177.0 lb

## 2020-12-21 DIAGNOSIS — I712 Thoracic aortic aneurysm, without rupture, unspecified: Secondary | ICD-10-CM

## 2020-12-21 NOTE — Progress Notes (Signed)
Patient ID: Kaylee Pope, female   DOB: November 21, 1955, 65 y.o.   MRN: [id number]  Reason for Consult: Follow-up   Referred by Maryland Pink, MD  Subjective:     HPI:  Kaylee Pope is a 65 y.o. female with known thoracic aortic aneurysm.  This was first diagnosed on noncontrasted CT for screening lung cancer.  She does not have any new back or abdominal pain.  She has no personal family history of aneurysm.  She has continued to smoke although has cut back significantly since her initial evaluation.  She has no new changes relative to today's visit.  Past Medical History:  Diagnosis Date   Allergy    Hypertension    Thyroid disease    Family History  Problem Relation Age of Onset   COPD Mother    Diabetes Sister    Cancer Maternal Aunt        breast & uterine   Breast cancer Maternal Aunt 60   Past Surgical History:  Procedure Laterality Date   TONSILLECTOMY     age 70    Short Social History:  Social History   Tobacco Use   Smoking status: Every Day    Packs/day: 1.00    Years: 50.00    Pack years: 50.00    Types: Cigarettes   Smokeless tobacco: Never  Substance Use Topics   Alcohol use: Not on file    No Known Allergies  Current Outpatient Medications  Medication Sig Dispense Refill   albuterol (VENTOLIN HFA) 108 (90 BASE) MCG/ACT inhaler Inhale 1-2 puffs into the lungs every 6 (six) hours as needed for wheezing or shortness of breath. 1 Inhaler 0   amLODipine (NORVASC) 10 MG tablet Take 1 tablet (10 mg total) by mouth daily. 90 tablet 0   aspirin EC 81 MG tablet Take 81 mg by mouth daily. Swallow whole.     fluticasone (FLONASE) 50 MCG/ACT nasal spray Place 2 sprays into both nostrils daily.     ibuprofen (ADVIL,MOTRIN) 200 MG tablet OTC as directed      levothyroxine (SYNTHROID, LEVOTHROID) 75 MCG tablet Take 1 tablet (75 mcg total) by mouth daily. 90 tablet 0   omeprazole (PRILOSEC) 20 MG capsule Take 1 capsule by mouth daily.     pravastatin  (PRAVACHOL) 20 MG tablet Take 1 tablet (20 mg total) by mouth daily. 90 tablet 0   TRELEGY ELLIPTA 100-62.5-25 MCG/INH AEPB Inhale 1 puff into the lungs daily.     venlafaxine XR (EFFEXOR-XR) 150 MG 24 hr capsule Take 1 capsule (150 mg total) by mouth daily. 90 capsule 0   No current facility-administered medications for this visit.    Review of Systems  Constitutional:  Constitutional negative. HENT: HENT negative.  Eyes: Eyes negative.  Respiratory: Respiratory negative.  Cardiovascular: Cardiovascular negative.  GI: Gastrointestinal negative.  Musculoskeletal: Musculoskeletal negative.  Skin: Skin negative.  Neurological: Neurological negative. Hematologic: Hematologic/lymphatic negative.  Psychiatric: Psychiatric negative.       Objective:  Objective   Vitals:   12/21/20 1019  BP: (!) 156/90  Pulse: 76  Resp: 20  Temp: 97.9 F (36.6 C)  SpO2: 94%  Weight: 177 lb (80.3 kg)  Height: 5' (1.524 m)   Body mass index is 34.57 kg/m.  Physical Exam HENT:     Head: Normocephalic.     Nose:     Comments: Wearing a mask Eyes:     Pupils: Pupils are equal, round, and reactive to light.  Cardiovascular:  Rate and Rhythm: Normal rate.     Pulses: Normal pulses.  Pulmonary:     Effort: Pulmonary effort is normal.  Abdominal:     General: Abdomen is flat.     Palpations: Abdomen is soft. There is no mass.  Musculoskeletal:        General: Normal range of motion.     Cervical back: Normal range of motion.  Skin:    General: Skin is warm and dry.     Capillary Refill: Capillary refill takes less than 2 seconds.  Neurological:     General: No focal deficit present.     Mental Status: She is alert.  Psychiatric:        Mood and Affect: Mood normal.        Thought Content: Thought content normal.    Data: CT IMPRESSION: 1. Thoracoabdominal aortic aneurysm involving the descending thoracic aorta, the visceral abdominal aorta and infrarenal aorta. Maximal  diameter of the descending thoracic aortic aneurysm is 5.2 cm, the visceral abdominal aortic aneurysm 4.1 cm, in the infrarenal abdominal aortic aneurysm 3.2 cm. Heterogeneous and irregular/ulcerated fibrofatty plaque throughout the diseased aorta. Recommend follow-up every 12 months and vascular consultation. This recommendation follows ACR consensus guidelines: White Paper of the ACR Incidental Findings Committee II on Vascular Findings. J Am Coll Radiol 2013; 10:789-794. 2. Multivessel coronary artery atherosclerotic calcifications. 3. Moderately severe centrilobular pulmonary emphysema. 4. 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 5. Additional ancillary findings as above.     Assessment/Plan:     65 year old female here with 5.2 cm descending thoracic component of a thoracoabdominal aneurysm.  We reviewed her CT scan today which is stable in size from previous noncontrasted scan.  I reviewed the possible options for repair which include open versus endovascular unfortunately given that this is a thoracoabdominal aneurysm I do not have the tools for endovascular repair.  I have offered the patient referral to [facility] for further evaluation versus continued monitoring with CT angio here.  Patient will follow-up in 1 year with CT angio with me.  If at that time the aneurysm has grown we will discuss referral to [facility] for evaluation.  We discussed the signs and symptoms of rupture for which to seek emergent medical evaluation and her and her husband demonstrate good understanding.  Short of this we will see her in 1 year with repeat CT scan.     Waynetta Sandy MD [facility]

## 2021-07-28 ENCOUNTER — Other Ambulatory Visit: Payer: Self-pay | Admitting: Family Medicine

## 2021-10-04 ENCOUNTER — Telehealth: Payer: Self-pay | Admitting: Acute Care

## 2021-10-04 NOTE — Telephone Encounter (Signed)
Pt declined annual LDCT.  She was dx with thoracic aneurysm which requires annual monitoring which includes lung status.  Pt feels that this is all she needs to do.

## 2021-11-20 ENCOUNTER — Other Ambulatory Visit: Payer: Self-pay

## 2021-11-20 DIAGNOSIS — I712 Thoracic aortic aneurysm, without rupture, unspecified: Secondary | ICD-10-CM

## 2021-12-21 ENCOUNTER — Ambulatory Visit
Admission: RE | Admit: 2021-12-21 | Discharge: 2021-12-21 | Disposition: A | Discharge status: Home / Self Care | Payer: Medicare HMO | Source: Ambulatory Visit | Attending: Vascular Surgery | Admitting: Vascular Surgery

## 2021-12-21 ENCOUNTER — Telehealth: Payer: Self-pay

## 2021-12-21 DIAGNOSIS — I712 Thoracic aortic aneurysm, without rupture, unspecified: Secondary | ICD-10-CM | POA: Diagnosis present

## 2021-12-21 LAB — POCT I-STAT CREATININE: Creatinine, Ser: 0.8 mg/dL (ref 0.44–1.00)

## 2021-12-21 MED ORDER — IOHEXOL 350 MG/ML SOLN
75.0000 mL | Freq: Once | INTRAVENOUS | Status: AC | PRN
  Administered 2021-12-21: 75 mL via INTRAVENOUS

## 2021-12-21 NOTE — Telephone Encounter (Signed)
Olivia Mackie with [facility] called to report CT results and ensure they were in Epic. Called back and confirmed.

## 2021-12-27 ENCOUNTER — Encounter: Payer: Self-pay | Admitting: Vascular Surgery

## 2021-12-27 ENCOUNTER — Ambulatory Visit: Payer: Medicare HMO | Admitting: Vascular Surgery

## 2021-12-27 VITALS — BP 154/85 | HR 79 | Temp 98.4°F | Resp 20 | Ht 60.0 in | Wt 177.0 lb

## 2021-12-27 DIAGNOSIS — I7162 Paravisceral aneurysm of the abdominal aorta, without rupture: Secondary | ICD-10-CM | POA: Diagnosis not present

## 2021-12-27 NOTE — Progress Notes (Signed)
Patient ID: AVYNN KLASSEN, female   DOB: 1955-10-24, 66 y.o.   MRN: [id number]  Reason for Consult: Follow-up   Referred by [place] Pink, MD  Subjective:     HPI:  NAELLE DIEGEL is a 66 y.o. female has a known thoracoabdominal aneurysm.  She is here with 1 year follow-up CT scan.  No back or abdominal pain.  No personal family history of aneurysm.  She continues to smoke.  Past Medical History:  Diagnosis Date   Allergy    Hypertension    Thyroid disease    Family History  Problem Relation Age of Onset   COPD Mother    Diabetes Sister    Cancer Maternal Aunt        breast & uterine   Breast cancer Maternal Aunt 60   Past Surgical History:  Procedure Laterality Date   TONSILLECTOMY     age 8    Short Social History:  Social History   Tobacco Use   Smoking status: Every Day    Packs/day: 1.00    Years: 50.00    Total pack years: 50.00    Types: Cigarettes   Smokeless tobacco: Never  Substance Use Topics   Alcohol use: Not on file    No Known Allergies  Current Outpatient Medications  Medication Sig Dispense Refill   albuterol (VENTOLIN HFA) 108 (90 BASE) MCG/ACT inhaler Inhale 1-2 puffs into the lungs every 6 (six) hours as needed for wheezing or shortness of breath. 1 Inhaler 0   amLODipine (NORVASC) 10 MG tablet Take 1 tablet (10 mg total) by mouth daily. 90 tablet 0   fluticasone (FLONASE) 50 MCG/ACT nasal spray Place 2 sprays into both nostrils daily.     ibuprofen (ADVIL,MOTRIN) 200 MG tablet OTC as directed      levothyroxine (SYNTHROID, LEVOTHROID) 75 MCG tablet Take 1 tablet (75 mcg total) by mouth daily. 90 tablet 0   omeprazole (PRILOSEC) 20 MG capsule Take 1 capsule by mouth daily.     pravastatin (PRAVACHOL) 20 MG tablet Take 1 tablet (20 mg total) by mouth daily. 90 tablet 0   venlafaxine XR (EFFEXOR-XR) 150 MG 24 hr capsule Take 1 capsule (150 mg total) by mouth daily. 90 capsule 0   aspirin EC 81 MG tablet Take 81 mg by mouth daily.  Swallow whole.     No current facility-administered medications for this visit.    Review of Systems  Constitutional:  Constitutional negative. HENT: HENT negative.  Eyes: Eyes negative.  Respiratory: Respiratory negative.  Cardiovascular: Cardiovascular negative.  GI: Gastrointestinal negative.  Musculoskeletal: Musculoskeletal negative.  Skin: Skin negative.  Neurological: Neurological negative. Hematologic: Hematologic/lymphatic negative.  Psychiatric: Psychiatric negative.        Objective:  Objective   Vitals:   12/27/21 1132  BP: (!) 154/85  Pulse: 79  Resp: 20  Temp: 98.4 F (36.9 C)  SpO2: 92%  Weight: 177 lb (80.3 kg)  Height: 5' (1.524 m)   Body mass index is 34.57 kg/m.  Physical Exam HENT:     Head: Normocephalic.     Nose: Nose normal.  Eyes:     Pupils: Pupils are equal, round, and reactive to light.  Cardiovascular:     Pulses:          Dorsalis pedis pulses are 2+ on the right side and 2+ on the left side.  Pulmonary:     Effort: Pulmonary effort is normal.  Abdominal:     General: Abdomen is  flat.     Palpations: Abdomen is soft. There is no mass.  Musculoskeletal:     Cervical back: Normal range of motion and neck supple.     Right lower leg: Edema present.     Left lower leg: No edema.  Skin:    Capillary Refill: Capillary refill takes less than 2 seconds.     Findings: Rash present.  Neurological:     General: No focal deficit present.     Mental Status: She is alert.  Psychiatric:        Mood and Affect: Mood normal.        Behavior: Behavior normal.        Thought Content: Thought content normal.     Data: CT IMPRESSION: Descending thoracic aortic aneurysm is again noted. The proximal portion of the descending thoracic aorta measures 5.2 cm. The distal descending thoracic aorta measures 5.7 cm in diameter, which is enlarged compared to prior exam of 2022 where it measured 5.2 cm. Large amount of mural thrombus is noted.  Recommend semi-annual imaging followup by CTA or MRA and referral to cardiothoracic surgery if not already obtained. This recommendation follows 2010 ACCF/AHA/AATS/ACR/ASA/SCA/SCAI/SIR/STS/SVM Guidelines for the Diagnosis and Management of Patients With Thoracic Aortic Disease. Circulation. 2010; 121: E266-e369TAA. Aortic aneurysm NOS (ICD10-I71.9). These results will be called to the ordering clinician or representative by the Radiologist Assistant, and communication documented in the PACS or [employer].   Mild emphysematous disease is noted in the upper lobes bilaterally.   Moderate coronary artery calcifications are noted suggesting coronary artery disease.   Aortic Atherosclerosis (ICD10-I70.0) and Emphysema (ICD10-J43.9).     Assessment/Plan:    66 year old female with now 5.7 cm descending thoracic aortic aneurysm which is actually a thoracoabdominal aneurysm involving the visceral arteries and down to the level of the renal arteries.  Given the complexity of this aneurysm I discussed referral to [facility] for evaluation of advanced endovascular intervention versus possible need for open thoracoabdominal procedure.  I discussed with the patient that this is his best chance of repair with meaningful quality of life afterwards her husband demonstrate good understanding.  I have also discussed that they may need additional CT scans given that we only performed a chest during this visit her last scan of the abdomen pelvis was 1 year ago.  We discussed the signs and symptoms of rupture although very unlikely would be high risk of death and they demonstrate good understanding.  Referral placed to [facility] for further evaluation and management.     Waynetta Sandy MD [facility]

## 2022-01-02 ENCOUNTER — Ambulatory Visit: Payer: Medicare HMO | Admitting: Dermatology

## 2022-01-02 ENCOUNTER — Encounter: Payer: Self-pay | Admitting: Vascular Surgery

## 2022-01-02 ENCOUNTER — Encounter: Payer: Self-pay | Admitting: Dermatology

## 2022-01-02 DIAGNOSIS — I872 Venous insufficiency (chronic) (peripheral): Secondary | ICD-10-CM | POA: Diagnosis not present

## 2022-01-02 DIAGNOSIS — L820 Inflamed seborrheic keratosis: Secondary | ICD-10-CM

## 2022-01-02 MED ORDER — TRIAMCINOLONE ACETONIDE 0.1 % EX OINT
TOPICAL_OINTMENT | CUTANEOUS | 0 refills | Status: DC
Start: 2022-01-02 — End: 2022-09-21

## 2022-01-02 NOTE — Progress Notes (Deleted)
   Follow-Up Visit   Subjective  Kaylee Pope is a 66 y.o. female who presents for the following: Rash (Patient c/o a rash on her right lower legs x 3 weeks using otc Cerave cream with a poor response.). The patient has spots on her chest and back to be evaluated, some may be new or changing and the patient has concerns that these could be cancer.    The following portions of the chart were reviewed this encounter and updated as appropriate:       Review of Systems:  No other skin or systemic complaints except as noted in HPI or Assessment and Plan.  Objective  Well appearing patient in no apparent distress; mood and affect are within normal limits.  A focused examination was performed including legs,chest,back. Relevant physical exam findings are noted in the Assessment and Plan.    Assessment & Plan   No follow-ups on file.  I, Marye Round, CMA, am acting as scribe for Forest Gleason, MD .

## 2022-01-02 NOTE — Progress Notes (Signed)
Follow-Up Visit   Subjective  Kaylee Pope is a 66 y.o. female who presents for the following: Rash (Patient c/o a itchy rash on her right lower leg x 3 weeks using otc Cerave cream with a poor response, ). Patient report she went to see her vascular vein doctor last week and was told she should see her dermatologist for this rash on her right leg. Per patient her vascular vein doctor was not concerned about the veins at the right leg.  The patient has spots on her shoulder to be evaluated, some may be new or changing.    The following portions of the chart were reviewed this encounter and updated as appropriate:   Tobacco  Allergies  Meds  Problems  Med Hx  Surg Hx  Fam Hx      Review of Systems:  No other skin or systemic complaints except as noted in HPI or Assessment and Plan.  Objective  Well appearing patient in no apparent distress; mood and affect are within normal limits.  A focused examination was performed including lower legs,chest,shoulders. Relevant physical exam findings are noted in the Assessment and Plan.  Right Lower Leg - Anterior 2+ pedal pulse at lower legs 1 + pitting edema at right lower leg Trace edema no rash on the left lower leg  left shoulder x 2 Stuck-on, waxy, tan-brown papules-- Discussed benign etiology and prognosis.     Assessment & Plan  Venous stasis dermatitis of right lower extremity Right Lower Leg - Anterior  Chronic stasis dermatitis with edema   Stasis in the legs causes chronic leg swelling, which may result in itchy or painful rashes, skin discoloration, skin texture changes, and sometimes ulceration.  Recommend daily graduated compression hose/stockings- easiest to put on first thing in morning, remove at bedtime.  Elevate legs as much as possible. Avoid salt/sodium rich foods.   Patient did not meet Wells criteria to suggest need for additional DVT evaluation. Right calf 1/2 inch larger than left calf. Pt saw vascular  last week. Discussed with patient if she has shortness of breath or breathing changes seek medical attention.  Start compression stocking 20/30 mmHg, style calf  #2 2RF, prescription given  Start Triamcinolone 0.1% ointment  to affected legs apply twice daily as needed to affected areas for two weeks then stop. Avoid applying to face, groin, and axilla. Use as directed. Long-term use can cause thinning of the skin.  Topical steroids (such as triamcinolone, fluocinolone, fluocinonide, mometasone, clobetasol, halobetasol, betamethasone, hydrocortisone) can cause thinning and lightening of the skin if they are used for too long in the same area. Your physician has selected the right strength medicine for your problem and area affected on the body. Please use your medication only as directed by your physician to prevent side effects.    Related Medications triamcinolone ointment (KENALOG) 0.1 % apply twice daily as needed to affected areas for two weeks then stop. Avoid applying to face, groin, and axilla. Use as directed. Long-term use can cause thinning of the skin.  Inflamed seborrheic keratosis left shoulder x 2  Symptomatic, irritating, patient would like treated.   Destruction of lesion - left shoulder x 2 Complexity: simple   Destruction method: cryotherapy   Informed consent: discussed and consent obtained   Timeout:  patient name, date of birth, surgical site, and procedure verified Lesion destroyed using liquid nitrogen: Yes   Region frozen until ice ball extended beyond lesion: Yes   Outcome: patient tolerated procedure  well with no complications   Post-procedure details: wound care instructions given   Additional details:  Prior to procedure, discussed risks of blister formation, small wound, skin dyspigmentation, or rare scar following cryotherapy. Recommend Vaseline ointment to treated areas while healing.    Return in about 6 weeks (around 02/13/2022) for stasis dermatitis  .  I, Marye Round, CMA, am acting as scribe for Forest Gleason, MD .   Documentation: I have reviewed the above documentation for accuracy and completeness, and I agree with the above.  Forest Gleason, MD

## 2022-01-02 NOTE — Patient Instructions (Addendum)
Cryotherapy Aftercare  Wash gently with soap and water everyday.   Apply Vaseline and Band-Aid daily until healed.   Start over the counter Vanicream moisturizer  apply to affected skin daily   Due to recent changes in healthcare laws, you may see results of your pathology and/or laboratory studies on MyChart before the doctors have had a chance to review them. We understand that in some cases there may be results that are confusing or concerning to you. Please understand that not all results are received at the same time and often the doctors may need to interpret multiple results in order to provide you with the best plan of care or course of treatment. Therefore, we ask that you please give [place] 2 business days to thoroughly review all your results before contacting the office for clarification. Should we see a critical lab result, you will be contacted sooner.   If You Need Anything After Your Visit  If you have any questions or concerns for your doctor, please call our main line at [phone] and press option 4 to reach your doctor's medical assistant. If no one answers, please leave a voicemail as directed and we will return your call as soon as possible. Messages left after 4 pm will be answered the following business day.   You may also send [place] a message via [place]. We typically respond to MyChart messages within 1-2 business days.  For prescription refills, please ask your pharmacy to contact our office. Our fax number is [phone].  If you have an urgent issue when the clinic is closed that cannot wait until the next business day, you can page your doctor at the number below.    Please note that while we do our best to be available for urgent issues outside of office hours, we are not available 24/7.   If you have an urgent issue and are unable to reach [place], you may choose to seek medical care at your doctor's office, retail clinic, urgent care center, or emergency room.  If you  have a medical emergency, please immediately call 911 or go to the emergency department.  Pager Numbers  - Dr. Nehemiah Massed: [phone]  - Dr. Laurence Ferrari: [phone]  - Dr. Nicole Kindred: [phone]  In the event of inclement weather, please call our main line at [phone] for an update on the status of any delays or closures.  Dermatology Medication Tips: Please keep the boxes that topical medications come in in order to help keep track of the instructions about where and how to use these. Pharmacies typically print the medication instructions only on the boxes and not directly on the medication tubes.   If your medication is too expensive, please contact our office at [phone] option 4 or send [place] a message through [place].   We are unable to tell what your co-pay for medications will be in advance as this is different depending on your insurance coverage. However, we may be able to find a substitute medication at lower cost or fill out paperwork to get insurance to cover a needed medication.   If a prior authorization is required to get your medication covered by your insurance company, please allow [place] 1-2 business days to complete this process.  Drug prices often vary depending on where the prescription is filled and some pharmacies may offer cheaper prices.  The website www.goodrx.com contains coupons for medications through different pharmacies. The prices here do not account for what the cost may be with help from  insurance (it may be cheaper with your insurance), but the website can give you the price if you did not use any insurance.  - You can print the associated coupon and take it with your prescription to the pharmacy.  - You may also stop by our office during regular business hours and pick up a GoodRx coupon card.  - If you need your prescription sent electronically to a different pharmacy, notify our office through [facility] or by phone at [phone] option  4.     Si Usted Necesita Algo Despus de Su Visita  Tambin puede enviarnos un mensaje a travs de Pharmacist, community. Por lo general respondemos a los mensajes de MyChart en el transcurso de 1 a 2 das hbiles.  Para renovar recetas, por favor pida a su farmacia que se ponga en contacto con nuestra oficina. Harland Dingwall de fax es [place] [phone].  Si tiene un asunto urgente cuando la clnica est cerrada y que no puede esperar hasta el siguiente da hbil, puede llamar/localizar a su doctor(a) al nmero que aparece a continuacin.   Por favor, tenga en cuenta que aunque hacemos todo lo posible para estar disponibles para asuntos urgentes fuera del horario de [place], no estamos disponibles las 24 horas del da, los 7 das de la [place].   Si tiene un problema urgente y no puede comunicarse con nosotros, puede optar por buscar atencin mdica  en el consultorio de su doctor(a), en una clnica privada, en un centro de atencin urgente o en una sala de emergencias.  Si tiene Engineering geologist, por favor llame inmediatamente al 911 o vaya a la sala de emergencias.  Nmeros de bper  - Dr. Nehemiah Massed: [phone]  - Dra. Moye: [phone]  - Dra. Nicole Kindred: [phone]  En caso de inclemencias del [place], por favor llame a Johnsie Kindred principal al [phone] para una actualizacin sobre el [place] de cualquier retraso o cierre.  Consejos para la medicacin en dermatologa: Por favor, guarde las cajas en las que vienen los medicamentos de uso tpico para ayudarle a seguir las instrucciones sobre dnde y cmo usarlos. Las farmacias generalmente imprimen las instrucciones del medicamento slo en las cajas y no directamente en los tubos del [place].   Si su medicamento es muy caro, por favor, pngase en contacto con Zigmund Daniel llamando al [phone] y presione la opcin 4 o envenos un mensaje a travs de Pharmacist, community.   No podemos decirle cul ser su copago por los medicamentos por  adelantado ya que esto es diferente dependiendo de la cobertura de su seguro. Sin embargo, es posible que podamos encontrar un medicamento sustituto a Electrical engineer un formulario para que el seguro cubra el medicamento que se considera necesario.   Si se requiere una autorizacin previa para que su compaa de seguros [place] su medicamento, por favor permtanos de 1 a 2 das hbiles para completar este proceso.  Los precios de los medicamentos varan con frecuencia dependiendo del Environmental consultant de dnde se surte la receta y alguna farmacias pueden ofrecer precios ms baratos.  El sitio web www.goodrx.com tiene cupones para medicamentos de Airline pilot. Los precios aqu no tienen en cuenta lo que podra costar con la ayuda del seguro (puede ser ms barato con su seguro), pero el sitio web puede darle el precio si no utiliz Research scientist (physical sciences).  - Puede imprimir el cupn correspondiente y llevarlo con su receta a la farmacia.  - Tambin puede pasar por nuestra oficina durante el horario de  atencin regular y recoger una tarjeta de cupones de GoodRx.  - Si necesita que su receta se enve electrnicamente a una farmacia diferente, informe a nuestra oficina a travs de MyChart de [place] o por telfono llamando al [phone] y presione la opcin 4.  

## 2022-01-18 ENCOUNTER — Ambulatory Visit: Payer: Medicare HMO | Admitting: Dermatology

## 2022-01-18 DIAGNOSIS — L309 Dermatitis, unspecified: Secondary | ICD-10-CM

## 2022-01-18 DIAGNOSIS — I872 Venous insufficiency (chronic) (peripheral): Secondary | ICD-10-CM | POA: Diagnosis not present

## 2022-01-18 MED ORDER — CLOBETASOL PROPIONATE 0.05 % EX FOAM
Freq: Two times a day (BID) | CUTANEOUS | 2 refills | Status: DC
Start: 2022-01-18 — End: 2022-09-21

## 2022-01-18 MED ORDER — TACROLIMUS 0.1 % EX OINT: TOPICAL_OINTMENT | Freq: Two times a day (BID) | CUTANEOUS | 1 refills | Status: DC

## 2022-01-18 NOTE — Patient Instructions (Addendum)
Gentle Skin Care Guide  1. Bathe no more than once a day.  2. Avoid bathing in hot water  3. Use a mild soap like Dove, Vanicream, Cetaphil, CeraVe. Can use Lever 2000 or Cetaphil antibacterial soap  4. Use soap only where you need it. On most days, use it under your arms, between your legs, and on your feet. Let the water rinse other areas unless visibly dirty.  5. When you get out of the bath/shower, use a towel to gently blot your skin dry, don't rub it.  6. While your skin is still a little damp, apply a moisturizing cream such as Vanicream, CeraVe, Cetaphil, Eucerin, Sarna lotion or plain Vaseline Jelly. For hands apply Neutrogena [place] Hand Cream or Excipial Hand Cream.  7. Reapply moisturizer any time you start to itch or feel dry.  8. Sometimes using free and clear laundry detergents can be helpful. Fabric softener sheets should be avoided. Downy Free & Gentle liquid, or any liquid fabric softener that is free of dyes and perfumes, it acceptable to use  9. If your doctor has given you prescription creams you may apply moisturizers over them   Recommend over the counter Sarna as needed for itch.  Arms - Start clobetasol 0.05% ointment twice daily for up to 2 weeks to arms then as needed. Avoid applying to face, groin, and axilla. Use as directed. Long-term use can cause thinning of the skin. After 2 weeks, switch to tacrolimus twice daily as needed.   Legs - Start clobetasol 0.05% ointment twice daily to legs on weekends only as needed. Avoid applying to face, groin, and axilla. Use as directed. Long-term use can cause thinning of the skin. Start tacrolimus twice daily to legs on weekdays.   Topical steroids (such as triamcinolone, fluocinolone, fluocinonide, mometasone, clobetasol, halobetasol, betamethasone, hydrocortisone) can cause thinning and lightening of the skin if they are used for too long in the same area. Your physician has selected the right strength medicine for  your problem and area affected on the body. Please use your medication only as directed by your physician to prevent side effects.   Due to recent changes in healthcare laws, you may see results of your pathology and/or laboratory studies on MyChart before the doctors have had a chance to review them. We understand that in some cases there may be results that are confusing or concerning to you. Please understand that not all results are received at the same time and often the doctors may need to interpret multiple results in order to provide you with the best plan of care or course of treatment. Therefore, we ask that you please give [place] 2 business days to thoroughly review all your results before contacting the office for clarification. Should we see a critical lab result, you will be contacted sooner.   If You Need Anything After Your Visit  If you have any questions or concerns for your doctor, please call our main line at [phone] and press option 4 to reach your doctor's medical assistant. If no one answers, please leave a voicemail as directed and we will return your call as soon as possible. Messages left after 4 pm will be answered the following business day.   You may also send [place] a message via [place]. We typically respond to MyChart messages within 1-2 business days.  For prescription refills, please ask your pharmacy to contact our office. Our fax number is [phone].  If you have an urgent issue when the  clinic is closed that cannot wait until the next business day, you can page your doctor at the number below.    Please note that while we do our best to be available for urgent issues outside of office hours, we are not available 24/7.   If you have an urgent issue and are unable to reach [place], you may choose to seek medical care at your doctor's office, retail clinic, urgent care center, or emergency room.  If you have a medical emergency, please immediately call 911 or go to the  emergency department.  Pager Numbers  - Dr. Nehemiah Massed: [phone]  - Dr. Laurence Ferrari: [phone]  - Dr. Nicole Kindred: [phone]  In the event of inclement weather, please call our main line at [phone] for an update on the status of any delays or closures.  Dermatology Medication Tips: Please keep the boxes that topical medications come in in order to help keep track of the instructions about where and how to use these. Pharmacies typically print the medication instructions only on the boxes and not directly on the medication tubes.   If your medication is too expensive, please contact our office at [phone] option 4 or send [place] a message through [place].   We are unable to tell what your co-pay for medications will be in advance as this is different depending on your insurance coverage. However, we may be able to find a substitute medication at lower cost or fill out paperwork to get insurance to cover a needed medication.   If a prior authorization is required to get your medication covered by your insurance company, please allow [place] 1-2 business days to complete this process.  Drug prices often vary depending on where the prescription is filled and some pharmacies may offer cheaper prices.  The website www.goodrx.com contains coupons for medications through different pharmacies. The prices here do not account for what the cost may be with help from insurance (it may be cheaper with your insurance), but the website can give you the price if you did not use any insurance.  - You can print the associated coupon and take it with your prescription to the pharmacy.  - You may also stop by our office during regular business hours and pick up a GoodRx coupon card.  - If you need your prescription sent electronically to a different pharmacy, notify our office through [facility] or by phone at [phone] option 4.     Si Usted Necesita Algo Despus de Su Visita  Tambin puede  enviarnos un mensaje a travs de Pharmacist, community. Por lo general respondemos a los mensajes de MyChart en el transcurso de 1 a 2 das hbiles.  Para renovar recetas, por favor pida a su farmacia que se ponga en contacto con nuestra oficina. Harland Dingwall de fax es [place] [phone].  Si tiene un asunto urgente cuando la clnica est cerrada y que no puede esperar hasta el siguiente da hbil, puede llamar/localizar a su doctor(a) al nmero que aparece a continuacin.   Por favor, tenga en cuenta que aunque hacemos todo lo posible para estar disponibles para asuntos urgentes fuera del horario de [place], no estamos disponibles las 24 horas del da, los 7 das de la [place].   Si tiene un problema urgente y no puede comunicarse con nosotros, puede optar por buscar atencin mdica  en el consultorio de su doctor(a), en una clnica privada, en un centro de atencin urgente o en una sala de emergencias.  Si tiene [employer]  emergencia mdica, por favor llame inmediatamente al 911 o vaya a la sala de emergencias.  Nmeros de bper  - Dr. Nehemiah Massed: [phone]  - Dra. Moye: [phone]  - Dra. Nicole Kindred: [phone]  En caso de inclemencias del [place], por favor llame a Johnsie Kindred principal al [phone] para una actualizacin sobre el [place] de cualquier retraso o cierre.  Consejos para la medicacin en dermatologa: Por favor, guarde las cajas en las que vienen los medicamentos de uso tpico para ayudarle a seguir las instrucciones sobre dnde y cmo usarlos. Las farmacias generalmente imprimen las instrucciones del medicamento slo en las cajas y no directamente en los tubos del [place].   Si su medicamento es muy caro, por favor, pngase en contacto con Zigmund Daniel llamando al [phone] y presione la opcin 4 o envenos un mensaje a travs de Pharmacist, community.   No podemos decirle cul ser su copago por los medicamentos por adelantado ya que esto es diferente dependiendo de la cobertura de su seguro.  Sin embargo, es posible que podamos encontrar un medicamento sustituto a Electrical engineer un formulario para que el seguro cubra el medicamento que se considera necesario.   Si se requiere una autorizacin previa para que su compaa de seguros [place] su medicamento, por favor permtanos de 1 a 2 das hbiles para completar este proceso.  Los precios de los medicamentos varan con frecuencia dependiendo del Environmental consultant de dnde se surte la receta y alguna farmacias pueden ofrecer precios ms baratos.  El sitio web www.goodrx.com tiene cupones para medicamentos de Airline pilot. Los precios aqu no tienen en cuenta lo que podra costar con la ayuda del seguro (puede ser ms barato con su seguro), pero el sitio web puede darle el precio si no utiliz Research scientist (physical sciences).  - Puede imprimir el cupn correspondiente y llevarlo con su receta a la farmacia.  - Tambin puede pasar por nuestra oficina durante el horario de atencin regular y Charity fundraiser una tarjeta de cupones de GoodRx.  - Si necesita que su receta se enve electrnicamente a una farmacia diferente, informe a nuestra oficina a travs de MyChart de [place] o por telfono llamando al [phone] y presione la opcin 4.

## 2022-01-18 NOTE — Progress Notes (Signed)
Follow-Up Visit   Subjective  Kaylee Pope is a 66 y.o. female who presents for the following: Rash (Patient here today for rash that started at legs but has now spread to arms. She woke up with hives yesterday. ).  Patient was seen last month for stasis dermatitis and was prescribed TMC 0.1% ointment. Patient is not currently using it.   The following portions of the chart were reviewed this encounter and updated as appropriate:   Tobacco  Allergies  Meds  Problems  Med Hx  Surg Hx  Fam Hx      Review of Systems:  No other skin or systemic complaints except as noted in HPI or Assessment and Plan.  Objective  Well appearing patient in no apparent distress; mood and affect are within normal limits.  A focused examination was performed including arms, legs. Relevant physical exam findings are noted in the Assessment and Plan.  B/L lower legs Scaly pink papules coalescing to plaques + edema  B/L arms Scaly pink papules coalescing to plaques     Assessment & Plan  Venous stasis dermatitis of right lower extremity B/L lower legs  Chronic and persistent condition with duration or expected duration over one year. Condition is symptomatic/ bothersome to patient. Not currently at goal.  Start clobetasol 0.05% ointment twice daily to legs on weekends only as needed. Avoid applying to face, groin, and axilla. Use as directed. Long-term use can cause thinning of the skin.  Start tacrolimus twice daily to legs on weekdays.   Topical steroids (such as triamcinolone, fluocinolone, fluocinonide, mometasone, clobetasol, halobetasol, betamethasone, hydrocortisone) can cause thinning and lightening of the skin if they are used for too long in the same area. Your physician has selected the right strength medicine for your problem and area affected on the body. Please use your medication only as directed by your physician to prevent side effects.    clobetasol (OLUX) 0.05 % topical  foam - B/L lower legs Apply topically 2 (two) times daily. For up to 2 weeks as needed. Avoid applying to face, groin, and axilla. Use as directed. Long-term use can cause thinning of the skin.  Related Medications triamcinolone ointment (KENALOG) 0.1 % apply twice daily as needed to affected areas for two weeks then stop. Avoid applying to face, groin, and axilla. Use as directed. Long-term use can cause thinning of the skin.  Dermatitis B/L arms  Ddx eczema vs id reaction vs contact dermatitis  Start clobetasol 0.05% ointment twice daily for up to 2 weeks then as needed. Avoid applying to face, groin, and axilla. Use as directed. Long-term use can cause thinning of the skin.  After 2 weeks, switch to tacrolimus twice daily as needed.   Topical steroids (such as triamcinolone, fluocinolone, fluocinonide, mometasone, clobetasol, halobetasol, betamethasone, hydrocortisone) can cause thinning and lightening of the skin if they are used for too long in the same area. Your physician has selected the right strength medicine for your problem and area affected on the body. Please use your medication only as directed by your physician to prevent side effects.    Consider biopsy or patch testing if not responding well to treatment  tacrolimus (PROTOPIC) 0.1 % ointment - B/L arms Apply topically 2 (two) times daily.   Return for as scheduled.  Kaylee Pope, RMA, am acting as scribe for Forest Gleason, MD .  Documentation: I have reviewed the above documentation for accuracy and completeness, and I agree with the above.  Kaylee Balbina Depace,  MD   

## 2022-01-19 ENCOUNTER — Encounter: Payer: Self-pay | Admitting: Dermatology

## 2022-02-15 ENCOUNTER — Ambulatory Visit: Payer: Medicare HMO | Admitting: Dermatology

## 2022-02-21 ENCOUNTER — Ambulatory Visit: Payer: Medicare HMO | Admitting: Vascular Surgery

## 2022-02-21 ENCOUNTER — Ambulatory Visit (INDEPENDENT_AMBULATORY_CARE_PROVIDER_SITE_OTHER): Payer: Medicare HMO | Admitting: Vascular Surgery

## 2022-02-21 DIAGNOSIS — I7123 Aneurysm of the descending thoracic aorta, without rupture: Secondary | ICD-10-CM

## 2022-02-21 NOTE — Progress Notes (Signed)
       Virtual Visit via Telephone Note   I connected with [name] on 02/21/2022 using the [employer] by telephone and verified that I was speaking with the correct person using two identifiers. Patient was located at home and accompanied by her husband. I am located at in the office alone.   The limitations of evaluation and management by telemedicine and the availability of in person appointments have been previously discussed with the patient and are documented in the patients chart. The patient expressed understanding and consented to proceed.  Chief Complaint: thoracoabdominal aneurysm  History of Present Illness: Kaylee Pope is a 66 y.o. female with history of thoracoabdominal aneurysm.  I reviewed her CT scan in the past and referred her to Dr. Sammuel Hines and she has now seen him and he has recommended two-stage repair in the trial.  She now calls with multiple questions with her husband.  Past Medical History:  Diagnosis Date   Allergy    Hypertension    Thyroid disease     Past Surgical History:  Procedure Laterality Date   TONSILLECTOMY     age 50    12 system ROS was negative unless otherwise noted in HPI   Observations/Objective: Patient demonstrates good understanding of our conversation in the presence of her husband today.  Assessment and Plan: 66 year old female with history of thoracoabdominal aneurysm repair.  She had multiple questions regarding Dr. Eddie Dibbles procedure and I have discussed with her that I again reviewed the CT scan at length with one of my partners and given the nature of this aneurysm her only chance for endovascular repair would likely be at [facility].  I have offered her referral to [facility] or [facility] for open aneurysm consideration.  I have attempted to answer all of their questions to the best of my ability though some are regarding scheduling and travel to [facility] which I do not have the information.  Overall I think patient is best  suited with repair with Dr. Sammuel Hines and his team and I have again discussed this with the patient and her husband and they demonstrate good understanding.  She can again have a conversation with me or see me on an as-needed basis.   I spent 25 minutes with the patient via telephone encounter and in reviewing imaging prior to our visit.   Signed, Servando Snare [facility] Office: [phone]   02/21/2022, 8:49 AM

## 2022-06-18 NOTE — Progress Notes (Unsigned)
New Outpatient Visit Date: 06/20/2022  Referring Provider: Maryland Pink, MD [address] [place],   [id number]  Chief Complaint: Shortness of breath and chest discomfort  HPI:  Kaylee Pope is a 67 y.o. female who is being seen today for the evaluation of dyspnea on exertion at the request of Dr. Kary Kos. She has a history of thoracoabdominal aortic aneurysm followed by Drs. Donzetta Matters ([facility]) and Sammuel Hines [facility]), hypertension, hyperlipidemia, hypothyroidism.  Kaylee Pope reports that she experienced some chest discomfort and shortness of breath with modest activity last fall.  It was most apparent when she was carrying groceries upstairs and was accompanied by a spike in her heart rate, up to 120 bpm.  Her blood pressure was also suboptimally controlled at that time, which prompted Dr. Kary Kos to add carvedilol.  Since being started on carvedilol about 3 months ago, Kaylee Pope reports that she has not had any further chest tightness or exertional dyspnea.  Kaylee Pope denies prior heart testing.  She notes that her thoracicoabdominal aortic aneurysm was incidentally discovered on a lung cancer screening CT.  She is currently undergoing further evaluation for participation in a research study at [facility] for minimally invasive repair of the aneurysm.  This will include carotid Dopplers, lower extremity Dopplers, and an echocardiogram.  She denies edema, orthopnea, and palpitations.  She has been bothered by some left lower quadrant and left flank pain for which she will be seeing her gynecologist as well as urology.  --------------------------------------------------------------------------------------------------  Cardiovascular History & Procedures: Cardiovascular Problems: Dyspnea on exertion Thoracoabdominal aortic aneurysm  Risk Factors: Aortic aneurysm, hypertension, hyperlipidemia, diabetes mellitus, obesity tobacco use, and age > 26  Cath/PCI: None  CV  Surgery: None  EP Procedures and Devices: None  Non-Invasive Evaluation(s): None  Recent CV Pertinent Labs: Lab Results  Component Value Date   CHOL 236 (H) 10/20/2014   HDL 36.80 (L) 10/20/2014   LDLCALC 106 (H) 04/29/2012   LDLDIRECT 161.0 10/20/2014   TRIG 209.0 (H) 10/20/2014   CHOLHDL 6 10/20/2014   K 3.7 10/20/2014   BUN 12 10/20/2014   CREATININE 0.80 12/21/2021    --------------------------------------------------------------------------------------------------  Past Medical History:  Diagnosis Date   Allergy    Hyperlipidemia    Hypertension    Thoracoabdominal aortic aneurysm (HCC)    Thyroid disease     Past Surgical History:  Procedure Laterality Date   TONSILLECTOMY     age 82    Current Meds  Medication Sig   amLODipine (NORVASC) 10 MG tablet Take 1 tablet (10 mg total) by mouth daily.   aspirin EC 81 MG tablet Take 81 mg by mouth daily. Swallow whole.   carvedilol (COREG) 6.25 MG tablet Take 6.25 mg by mouth 2 (two) times daily with a meal.   clobetasol (OLUX) 0.05 % topical foam Apply topically 2 (two) times daily. For up to 2 weeks as needed. Avoid applying to face, groin, and axilla. Use as directed. Long-term use can cause thinning of the skin.   fluticasone (FLONASE) 50 MCG/ACT nasal spray Place 2 sprays into both nostrils daily as needed for allergies.   ibuprofen (ADVIL,MOTRIN) 200 MG tablet Take 200 mg by mouth every 8 (eight) hours as needed for moderate pain. OTC as directed   levothyroxine (SYNTHROID, LEVOTHROID) 75 MCG tablet Take 1 tablet (75 mcg total) by mouth daily.   rosuvastatin (CRESTOR) 10 MG tablet Take 10 mg by mouth daily.   tacrolimus (PROTOPIC) 0.1 % ointment Apply topically 2 (  two) times daily.   triamcinolone ointment (KENALOG) 0.1 % apply twice daily as needed to affected areas for two weeks then stop. Avoid applying to face, groin, and axilla. Use as directed. Long-term use can cause thinning of the skin.   venlafaxine XR  (EFFEXOR-XR) 150 MG 24 hr capsule Take 1 capsule (150 mg total) by mouth daily.    Allergies: Patient has no known allergies.  Social History   Tobacco Use   Smoking status: Every Day    Packs/day: 1.00    Years: 50.00    Total pack years: 50.00    Types: Cigarettes   Smokeless tobacco: Never   Tobacco comments:    Slightly under 1 PPD  Vaping Use   Vaping Use: Never used  Substance Use Topics   Alcohol use: Not Currently   Drug use: Never    Family History  Problem Relation Age of Onset   COPD Mother    Heart attack Father    Stroke Father    Diabetes Sister    Cancer Maternal Aunt        breast & uterine   Breast cancer Maternal Aunt 60   Heart Problems Maternal Grandmother     Review of Systems: A 12-system review of systems was performed and was negative except as noted in the HPI.  --------------------------------------------------------------------------------------------------  Physical Exam: BP 126/82 (BP Location: Right Arm)   Pulse 71   Ht 5' (1.524 m)   Wt 165 lb 3.2 oz (74.9 kg)   SpO2 94%   BMI 32.26 kg/m   General: NAD. HEENT: No conjunctival pallor or scleral icterus. Neck: Supple without lymphadenopathy, thyromegaly, JVD, or HJR. No carotid bruit. Lungs: Normal work of breathing. Clear to auscultation bilaterally without wheezes or crackles. Heart: Regular rate and rhythm without murmurs, rubs, or gallops. Non-displaced PMI. Abd: Bowel sounds present. Soft, NT/ND without hepatosplenomegaly Ext: No lower extremity edema. Radial, PT, and DP pulses are 2+ bilaterally Skin: Warm and dry without rash. Neuro: CNIII-XII intact. Strength and fine-touch sensation intact in upper and lower extremities bilaterally. Psych: Normal mood and affect.  EKG: Normal sinus rhythm with nonspecific T wave abnormality.  No prior tracing available for comparison.  Lab Results  Component Value Date   WBC 6.1 10/20/2014   HGB 14.3 10/20/2014   HCT 42.5  10/20/2014   MCV 91.9 10/20/2014   PLT 221.0 10/20/2014    Lab Results  Component Value Date   NA 142 10/20/2014   K 3.7 10/20/2014   CL 103 10/20/2014   CO2 32 10/20/2014   BUN 12 10/20/2014   CREATININE 0.80 12/21/2021   GLUCOSE 96 10/20/2014   ALT 20 10/20/2014    Lab Results  Component Value Date   CHOL 236 (H) 10/20/2014   HDL 36.80 (L) 10/20/2014   LDLCALC 106 (H) 04/29/2012   LDLDIRECT 161.0 10/20/2014   TRIG 209.0 (H) 10/20/2014   CHOLHDL 6 10/20/2014     --------------------------------------------------------------------------------------------------  ASSESSMENT AND PLAN: Dyspnea on exertion and precordial pain: Symptoms could be due to a number of factors but are certainly concerning for ischemic heart disease.  Fortunately, symptoms have improved with addition of carvedilol, which has likely provided some improvement in blood pressure control as well as antianginal therapy.  I have reviewed her most recent CTA of the chest from 12/2021, which demonstrates at least moderate calcified plaque in the ostial through mid LAD.  Coronary calcifications are also noted in the right coronary artery.  I have recommended obtaining  an echocardiogram and coronary CTA for further evaluation.  As echocardiogram is part of her research study at [facility], we will defer ordering that today; I have asked Kaylee Pope to reach out to her vascular team at [facility] to see if echocardiogram can be performed as soon as possible.  If it cannot be obtained in a timely fashion, we will proceed with doing the echocardiogram in our office.  In the meantime, she should continue aspirin, carvedilol, amlodipine, and rosuvastatin.  Thoracoabdominal aortic aneurysm: Continue close follow-up with vascular surgery and ongoing evaluation for minimally invasive repair at [facility].  Agree with current regimen for secondary prevention including aspirin and rosuvastatin.  Hyperlipidemia: Recent lipid panel at [facility] clinic  notable for LDL of 78 (goal less than 70 in the setting of thoracoabdominal aortic aneurysm and suspected CAD.  We will increase rosuvastatin to 20 mg daily.  Follow-up lipid panel and ALT will need to be checked in about 3 months.  Hypertension: Blood pressure borderline today.  Continue current doses of amlodipine and carvedilol for now.  Follow-up: Return to clinic in 6 weeks.  Nelva Bush, MD 06/20/2022 8:59 AM

## 2022-06-20 ENCOUNTER — Encounter: Payer: Self-pay | Admitting: Internal Medicine

## 2022-06-20 ENCOUNTER — Other Ambulatory Visit
Admission: RE | Admit: 2022-06-20 | Discharge: 2022-06-20 | Disposition: A | Discharge status: Home / Self Care | Payer: Medicare HMO | Source: Ambulatory Visit | Attending: Internal Medicine | Admitting: Internal Medicine

## 2022-06-20 ENCOUNTER — Ambulatory Visit: Payer: Medicare HMO | Attending: Internal Medicine | Admitting: Internal Medicine

## 2022-06-20 VITALS — BP 126/82 | HR 71 | Ht 60.0 in | Wt 165.2 lb

## 2022-06-20 DIAGNOSIS — Z01812 Encounter for preprocedural laboratory examination: Secondary | ICD-10-CM

## 2022-06-20 DIAGNOSIS — I716 Thoracoabdominal aortic aneurysm, without rupture, unspecified: Secondary | ICD-10-CM | POA: Diagnosis not present

## 2022-06-20 DIAGNOSIS — R072 Precordial pain: Secondary | ICD-10-CM

## 2022-06-20 DIAGNOSIS — R0600 Dyspnea, unspecified: Secondary | ICD-10-CM | POA: Diagnosis not present

## 2022-06-20 LAB — BASIC METABOLIC PANEL
Anion gap: 7 (ref 5–15)
BUN: 14 mg/dL (ref 8–23)
CO2: 28 mmol/L (ref 22–32)
Calcium: 9 mg/dL (ref 8.9–10.3)
Chloride: 103 mmol/L (ref 98–111)
Creatinine, Ser: 0.74 mg/dL (ref 0.44–1.00)
GFR, Estimated: >60 mL/min (ref >60)
Glucose, Bld: 104 mg/dL — ABNORMAL HIGH (ref 70–99)
Potassium: 3.5 mmol/L (ref 3.5–5.1)
Sodium: 138 mmol/L (ref 135–145)

## 2022-06-20 MED ORDER — METOPROLOL TARTRATE 50 MG PO TABS: ORAL_TABLET | ORAL | 0 refills | Status: DC

## 2022-06-20 MED ORDER — ROSUVASTATIN CALCIUM 20 MG PO TABS: 20.0000 mg | ORAL_TABLET | Freq: Every day | ORAL | 3 refills | Status: DC

## 2022-06-20 NOTE — Patient Instructions (Addendum)
Medication Instructions:  Your physician recommends the following medication changes.  INCREASE: Rosuvastatin to 20 mg by mouth daily   *If you need a refill on your cardiac medications before your next appointment, please call your pharmacy*   Lab Work: Your provider would like for you to have following labs drawn: (BMP).   Please go to the [facility] entrance and check in at the front desk.  You do not need an appointment.  They are open from 7am-6 pm.   If you have labs (blood work) drawn today and your tests are completely normal, you will receive your results only by: [place] (if you have MyChart) OR A paper copy in the mail If you have any lab test that is abnormal or we need to change your treatment, we will call you to review the results.   Testing/Procedures: Cardiac CT Angiography (CTA), is a special type of CT scan that uses a computer to produce multi-dimensional views of major blood vessels throughout the body. In CT angiography, a contrast material is injected through an IV to help visualize the blood vessels  Please follow instructions below   Follow-Up: At [facility], you and your health needs are our priority.  As part of our continuing mission to provide you with exceptional heart care, we have created designated Provider Care Teams.  These Care Teams include your primary Cardiologist (physician) and Advanced Practice Providers (APPs -  Physician Assistants and Nurse Practitioners) who all work together to provide you with the care you need, when you need it.  We recommend signing up for the patient portal called "MyChart".  Sign up information is provided on this After Visit Summary.  MyChart is used to connect with patients for Virtual Visits (Telemedicine).  Patients are able to view lab/test results, encounter notes, upcoming appointments, etc.  Non-urgent messages can be sent to your provider as well.   To learn more about what you can do  with MyChart, go to [email or web address].    Your next appointment:   6 week(s)  Provider:   You may see Nelva Bush, MD or one of the following Advanced Practice Providers on your designated Care Team:   Murray Hodgkins, NP Christell Faith, PA-C Cadence Kathlen Mody, PA-C Gerrie Nordmann, NP      Your cardiac CT will be scheduled at one of the below locations:   [facility] [place], [place] [address] [phone]  [facility] [place], [place] [address] [phone]  If scheduled at [facility], please arrive at the [facility] and Children's Entrance (Entrance C2) of [facility] 30 minutes prior to test start time. You can use the FREE valet parking offered at entrance C (encouraged to control the heart rate for the test)  Proceed to the [facility] Radiology Department (first floor) to check-in and test prep.  All radiology patients and guests should use entrance C2 at [facility], accessed from [facility], even though the hospital's physical address listed is [address].    If scheduled at [facility] or [facility], please arrive 15 mins early for check-in and test prep.   Please follow these instructions carefully (unless otherwise directed)  On the Night Before the Test: Be sure to Drink plenty of water. Do not consume any caffeinated/decaffeinated beverages or chocolate 12 hours prior to your test. Do not  take any antihistamines 12 hours prior to your test.  On the Day of the Test: Drink plenty of water until 1 hour prior to the test. Do not eat any food 1 hour prior to test. You may take your regular medications prior to the test.  Take metoprolol (Lopressor) 50 mg in addition to your carvedilol 6.25 mg two hours prior to test. FEMALES- please wear underwire-free bra if  available, avoid dresses & tight clothing  After the Test: Drink plenty of water. After receiving IV contrast, you may experience a mild flushed feeling. This is normal. On occasion, you may experience a mild rash up to 24 hours after the test. This is not dangerous. If this occurs, you can take Benadryl 25 mg and increase your fluid intake. If you experience trouble breathing, this can be serious. If it is severe call 911 IMMEDIATELY. If it is mild, please call our office. If you take any of these medications: Glipizide/Metformin, Avandament, Glucavance, please do not take 48 hours after completing test unless otherwise instructed.  We will call to schedule your test 2-4 weeks out understanding that some insurance companies will need an authorization prior to the service being performed.   For non-scheduling related questions, please contact the cardiac imaging nurse navigator should you have any questions/concerns: Marchia Bond, Cardiac Imaging Nurse Navigator Gordy Clement, Cardiac Imaging Nurse Navigator [facility] Direct Office Dial: [phone]   For scheduling needs, including cancellations and rescheduling, please call [place], [phone].

## 2022-06-26 ENCOUNTER — Ambulatory Visit (INDEPENDENT_AMBULATORY_CARE_PROVIDER_SITE_OTHER): Payer: Medicare HMO | Admitting: Urology

## 2022-06-26 ENCOUNTER — Encounter: Payer: Self-pay | Admitting: Internal Medicine

## 2022-06-26 ENCOUNTER — Encounter: Payer: Self-pay | Admitting: Urology

## 2022-06-26 ENCOUNTER — Other Ambulatory Visit
Admission: RE | Admit: 2022-06-26 | Discharge: 2022-06-26 | Disposition: A | Discharge status: Home / Self Care | Payer: Medicare HMO | Attending: Urology | Admitting: Urology

## 2022-06-26 ENCOUNTER — Telehealth: Payer: Self-pay | Admitting: Internal Medicine

## 2022-06-26 ENCOUNTER — Other Ambulatory Visit: Payer: Self-pay

## 2022-06-26 VITALS — BP 132/81 | HR 67 | Ht 60.0 in | Wt 166.0 lb

## 2022-06-26 DIAGNOSIS — R1032 Left lower quadrant pain: Secondary | ICD-10-CM

## 2022-06-26 DIAGNOSIS — R102 Pelvic and perineal pain unspecified side: Secondary | ICD-10-CM

## 2022-06-26 DIAGNOSIS — N200 Calculus of kidney: Secondary | ICD-10-CM

## 2022-06-26 LAB — URINALYSIS, COMPLETE (UACMP) WITH MICROSCOPIC
Bacteria, UA: NONE SEEN
Bilirubin Urine: NEGATIVE
Glucose, UA: NEGATIVE mg/dL
Hgb urine dipstick: NEGATIVE
Ketones, ur: NEGATIVE mg/dL
Leukocytes,Ua: NEGATIVE
Nitrite: NEGATIVE
Protein, ur: NEGATIVE mg/dL
RBC / HPF: NONE SEEN RBC/hpf (ref 0–5)
Specific Gravity, Urine: 1.015 (ref 1.005–1.030)
pH: 7.5 (ref 5.0–8.0)

## 2022-06-26 NOTE — Telephone Encounter (Signed)
Patient wanted to know if she was to still have CT and follow-up visit because she is going to have "a lot of procedures" in the next couple weeks. Patient stated that she isn't exact on the procedures and the dates so she will send a MyChart message with procedure dates and times and wants Dr. Saunders Revel to advise her on best treatment plan going forward.

## 2022-06-26 NOTE — Progress Notes (Signed)
   06/26/22 12:05 PM   Kaylee Pope [birth year] [id number]  CC: Left groin pain, possible kidney stone  HPI: I saw Kaylee Pope today for the above issues.  She reports a few months of left lower quadrant and groin pain of unclear etiology, no abnormalities found with GYN, and a KUB reportedly showed a possible 5 mm left renal stone.  However, she did have a CT in September 2023 that showed no evidence of hydronephrosis or nephrolithiasis.  Urinalysis have always been benign with no microscopic hematuria.  Her symptoms seem to be worse with eating or moving around.  She denies any gross hematuria or urinary symptoms.   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 She has a 50.00 pack-year smoking history. She has been exposed to tobacco smoke. She has never used smokeless tobacco. She reports that she does not currently use alcohol. She reports that she does not use drugs.  Physical Exam: BP 132/81   Pulse 67   Ht 5' (1.524 m)   Wt 166 lb (75.3 kg)   BMI 32.42 kg/m    Constitutional:  Alert and oriented, No acute distress. Cardiovascular: No clubbing, cyanosis, or edema. Respiratory: Normal respiratory effort, no increased work of breathing. GI: Abdomen is soft, nontender, nondistended, no abdominal masses  Laboratory Data: Urinalysis today benign  Pertinent Imaging: I have personally viewed and interpreted the CT abdomen and pelvis from September 2023 with no hydronephrosis or kidney stones.  Assessment & Plan:   67 year old female with  left lower quadrant and left groin pain of unclear etiology, this is worse with eating and moving around.  Was referred for possible kidney stones as KUB from [facility] system which I cannot personally review reports a possible 5 mm left renal stone, however she had a CT in September 2023 with no evidence of hydronephrosis or nephrolithiasis.  Urinalysis today benign.    We discussed this is unlikely to be secondary to nephrolithiasis with a recent normal CT, however with her ongoing symptoms of unclear etiology I think it is reasonable to order a CT stone protocol to absolutely rule out kidney stones, and will call with those results.   Nickolas Madrid, MD 06/26/2022  [facility] [address], [place], [place] [address] [phone]

## 2022-06-26 NOTE — Telephone Encounter (Signed)
Patient called to report she will be having an Echo test done on 4/2 at [facility].  Patient wants to know if she will still need to do the CT Cardiac Morph test (on 4/4) as they will also be doing additional test.  Patient stated she will be recuperating on 4/30 and wants to know if she will need to see cardiology prior to her procedure or follow-up after.

## 2022-07-04 ENCOUNTER — Ambulatory Visit
Admission: RE | Admit: 2022-07-04 | Discharge: 2022-07-04 | Disposition: A | Discharge status: Home / Self Care | Payer: Medicare HMO | Source: Ambulatory Visit | Attending: Urology | Admitting: Urology

## 2022-07-04 DIAGNOSIS — R1032 Left lower quadrant pain: Secondary | ICD-10-CM | POA: Diagnosis not present

## 2022-07-05 ENCOUNTER — Ambulatory Visit: Payer: Medicare HMO

## 2022-07-10 ENCOUNTER — Telehealth (HOSPITAL_COMMUNITY): Payer: Self-pay | Admitting: Emergency Medicine

## 2022-07-10 NOTE — Telephone Encounter (Signed)
Reaching out to patient to offer assistance regarding upcoming cardiac imaging study; pt verbalizes understanding of appt date/time, parking situation and where to check in, pre-test NPO status and medications ordered, and verified current allergies; name and call back number provided for further questions should they arise Kaylee Bond RN Navigator Cardiac Imaging [facility] [phone] office [phone] cell  Arrival 1045 [facility] Taking carvedilol 2 hr prior to scan Denies iv issues Aware contrast/nitro

## 2022-07-11 ENCOUNTER — Encounter: Payer: Self-pay | Admitting: Internal Medicine

## 2022-07-12 ENCOUNTER — Ambulatory Visit
Admission: RE | Admit: 2022-07-12 | Discharge: 2022-07-12 | Disposition: A | Discharge status: Home / Self Care | Payer: Medicare HMO | Source: Ambulatory Visit | Attending: Internal Medicine | Admitting: Internal Medicine

## 2022-07-12 ENCOUNTER — Other Ambulatory Visit: Payer: Self-pay | Admitting: Internal Medicine

## 2022-07-12 DIAGNOSIS — I2510 Atherosclerotic heart disease of native coronary artery without angina pectoris: Secondary | ICD-10-CM

## 2022-07-12 DIAGNOSIS — R072 Precordial pain: Secondary | ICD-10-CM | POA: Insufficient documentation

## 2022-07-12 DIAGNOSIS — R931 Abnormal findings on diagnostic imaging of heart and coronary circulation: Secondary | ICD-10-CM | POA: Diagnosis present

## 2022-07-12 DIAGNOSIS — R0600 Dyspnea, unspecified: Secondary | ICD-10-CM | POA: Diagnosis present

## 2022-07-12 MED ORDER — NITROGLYCERIN 0.4 MG SL SUBL: 0.8000 mg | SUBLINGUAL_TABLET | Freq: Once | SUBLINGUAL | Status: DC

## 2022-07-12 MED ORDER — IOHEXOL 350 MG/ML SOLN
100.0000 mL | Freq: Once | INTRAVENOUS | Status: AC | PRN
  Administered 2022-07-12: 100 mL via INTRAVENOUS

## 2022-07-12 MED ORDER — METOPROLOL TARTRATE 5 MG/5ML IV SOLN: 10.0000 mg | Freq: Once | INTRAVENOUS | Status: DC

## 2022-07-12 MED ORDER — NITROGLYCERIN 0.4 MG SL SUBL
0.4000 mg | SUBLINGUAL_TABLET | Freq: Once | SUBLINGUAL | Status: AC
  Administered 2022-07-12: 0.4 mg via SUBLINGUAL

## 2022-07-12 NOTE — Progress Notes (Signed)
Patient tolerated procedure well. Ambulate w/o difficulty. Denies light headedness or being dizzy. Sitting in chair drinking water provided. Encouraged to drink extra water today and reasoning explained. Verbalized understanding. All questions answered. ABC intact. No further needs. Discharge from procedure area w/o issues.   °

## 2022-07-19 ENCOUNTER — Encounter: Payer: Self-pay | Admitting: Internal Medicine

## 2022-07-19 ENCOUNTER — Ambulatory Visit: Payer: Medicare HMO | Attending: Cardiology | Admitting: Internal Medicine

## 2022-07-19 ENCOUNTER — Other Ambulatory Visit
Admission: RE | Admit: 2022-07-19 | Discharge: 2022-07-19 | Disposition: A | Discharge status: Home / Self Care | Payer: Medicare HMO | Source: Ambulatory Visit | Attending: Internal Medicine | Admitting: Internal Medicine

## 2022-07-19 VITALS — BP 128/78 | HR 52 | Ht 60.0 in | Wt 165.4 lb

## 2022-07-19 DIAGNOSIS — R079 Chest pain, unspecified: Secondary | ICD-10-CM | POA: Diagnosis present

## 2022-07-19 DIAGNOSIS — I10 Essential (primary) hypertension: Secondary | ICD-10-CM

## 2022-07-19 DIAGNOSIS — I25119 Atherosclerotic heart disease of native coronary artery with unspecified angina pectoris: Secondary | ICD-10-CM | POA: Diagnosis not present

## 2022-07-19 DIAGNOSIS — E785 Hyperlipidemia, unspecified: Secondary | ICD-10-CM

## 2022-07-19 DIAGNOSIS — I716 Thoracoabdominal aortic aneurysm, without rupture, unspecified: Secondary | ICD-10-CM | POA: Diagnosis not present

## 2022-07-19 LAB — CBC
HCT: 42.5 % (ref 36.0–46.0)
Hemoglobin: 13.3 g/dL (ref 12.0–15.0)
MCH: 28.6 pg (ref 26.0–34.0)
MCHC: 31.3 g/dL (ref 30.0–36.0)
MCV: 91.4 fL (ref 80.0–100.0)
Platelets: 185 10*3/uL (ref 150–400)
RBC: 4.65 MIL/uL (ref 3.87–5.11)
RDW: 13.8 % (ref 11.5–15.5)
WBC: 6.4 10*3/uL (ref 4.0–10.5)
nRBC: 0 % (ref 0.0–0.2)

## 2022-07-19 LAB — BASIC METABOLIC PANEL
Anion gap: 8 (ref 5–15)
BUN: 12 mg/dL (ref 8–23)
CO2: 28 mmol/L (ref 22–32)
Calcium: 9.2 mg/dL (ref 8.9–10.3)
Chloride: 103 mmol/L (ref 98–111)
Creatinine, Ser: 0.68 mg/dL (ref 0.44–1.00)
GFR, Estimated: >60 mL/min (ref >60)
Glucose, Bld: 96 mg/dL (ref 70–99)
Potassium: 3.7 mmol/L (ref 3.5–5.1)
Sodium: 139 mmol/L (ref 135–145)

## 2022-07-19 MED ORDER — NITROGLYCERIN 0.4 MG SL SUBL: 0.4000 mg | SUBLINGUAL_TABLET | SUBLINGUAL | 3 refills | Status: DC | PRN

## 2022-07-19 NOTE — Telephone Encounter (Signed)
I spoken with Dr. Pattricia Boss at [facility] regarding recent coronary CTA findings as well as Ms. Boyson's report of more recent chest pain and our plans to proceed with cardiac catheterization.  He notes that aortic intervention would need to be deferred while on dual antiplatelet therapy.  I will update him regarding cath findings and our recommendations following Monday's procedure.  I have updated Ms. Melikian on this discussion.  We will proceed with left heart catheterization with me on Monday at [facility].  BMP and CMP drawn today both normal.  Yvonne Kendall, MD [facility]

## 2022-07-19 NOTE — H&P (View-Only) (Signed)
Follow-up Outpatient Visit Date: 07/19/2022  Primary Care Provider: Jerl Mina, MD [address] [facility] [place] [address]  Chief Complaint: Chest pain and abnormal coronary CTA  HPI:  Ms. Poppell is a 67 y.o. female with history of thoracoabdominal aortic aneurysm followed by Drs. Randie Heinz ([facility]) and Pattricia Boss [facility]), hypertension, hyperlipidemia, hypothyroidism, who presents for follow-up of chest pain and shortness of breath with abnormal coronary CTA.  I met Ms. Shea a month ago for evaluation of chest pain and shortness of breath.  Symptoms began last fall but improved considerably with addition of carvedilol by Dr. Burnett Sheng.  Has a known large thoracicoabdominal aortic aneurysm and is currently undergoing workup for FEVAR are by Dr. Pattricia Boss at [facility].  We agreed to perform a coronary CTA, which showed significant three-vessel CAD.  Interval echocardiogram at [facility] showed normal LVEF without significant valvular abnormalities.  Today, Mess reports that she feels similar to prior visits.  However, the pain that previously seem to be localized more in her left flank now seems to have moved up to her chest.  She has had a few episodes of sharp pain under the left breast.  It sometimes comes on after she has been standing or walking, though she is also noted it after eating.  It takes her breath away for a few seconds.  She notes an episode of vague chest discomfort and dyspnea accompanied by feeling that she needed to "spit up" while at her pharmacy.  She rested for about 15 minutes and then was able to walk to her car.  Symptoms then resolved.  She reports that she is scheduled for preop visit at [facility] next week in anticipation of her FEVAR.  She reports that this would be done in a staged procedure with the initial repair being done later this month.  --------------------------------------------------------------------------------------------------  Cardiovascular History &  Procedures: Cardiovascular Problems: Dyspnea on exertion Thoracoabdominal aortic aneurysm Coronary artery disease   Risk Factors: Aortic aneurysm, hypertension, hyperlipidemia, diabetes mellitus, obesity tobacco use, and age > 68   Cath/PCI: None   CV Surgery: None   EP Procedures and Devices: Coronary CTA (07/12/2022): Right dominant coronary arteries with 50-69% proximal LAD stenosis and 25-49% mid LCx stenoses.  CT FFR significant in both vessels (0.71 in mid LAD and 0.77 in mid LCx).  Proximal to mid RCA appears chronically occluded.  Coronary calcium score 1361 (99th percentile). TTE (07/10/2022, [facility]): Normal LV size wall thickness.  LVEF greater than 55%.  Normal RV size and function.  Mild aortic regurgitation.   Non-Invasive Evaluation(s): None  Recent CV Pertinent Labs: Lab Results  Component Value Date   CHOL 236 (H) 10/20/2014   HDL 36.80 (L) 10/20/2014   LDLCALC 106 (H) 04/29/2012   LDLDIRECT 161.0 10/20/2014   TRIG 209.0 (H) 10/20/2014   CHOLHDL 6 10/20/2014   K 3.5 06/20/2022   BUN 14 06/20/2022   CREATININE 0.74 06/20/2022    Past medical and surgical history were reviewed and updated in EPIC.  Current Meds  Medication Sig   albuterol (VENTOLIN HFA) 108 (90 BASE) MCG/ACT inhaler Inhale 1-2 puffs into the lungs every 6 (six) hours as needed for wheezing or shortness of breath.   amLODipine (NORVASC) 10 MG tablet Take 1 tablet (10 mg total) by mouth daily.   aspirin EC 81 MG tablet Take 81 mg by mouth daily. Swallow whole.   carvedilol (COREG) 6.25 MG tablet Take 6.25 mg by mouth 2 (two) times daily with a meal.  clobetasol (OLUX) 0.05 % topical foam Apply topically 2 (two) times daily. For up to 2 weeks as needed. Avoid applying to face, groin, and axilla. Use as directed. Long-term use can cause thinning of the skin.   fluticasone (FLONASE) 50 MCG/ACT nasal spray Place 2 sprays into both nostrils daily as needed for allergies.   ibuprofen (ADVIL,MOTRIN) 200  MG tablet Take 200 mg by mouth every 8 (eight) hours as needed for moderate pain. OTC as directed   levothyroxine (SYNTHROID, LEVOTHROID) 75 MCG tablet Take 1 tablet (75 mcg total) by mouth daily.   rosuvastatin (CRESTOR) 20 MG tablet Take 1 tablet (20 mg total) by mouth daily.   tacrolimus (PROTOPIC) 0.1 % ointment Apply topically 2 (two) times daily.   triamcinolone ointment (KENALOG) 0.1 % apply twice daily as needed to affected areas for two weeks then stop. Avoid applying to face, groin, and axilla. Use as directed. Long-term use can cause thinning of the skin.   Varenicline Tartrate,Continue, 1 MG TABS 0.5 mg daily.   venlafaxine XR (EFFEXOR-XR) 150 MG 24 hr capsule Take 1 capsule (150 mg total) by mouth daily.    Allergies: Patient has no known allergies.  Social History   Tobacco Use   Smoking status: Every Day    Packs/day: 0.25    Years: 50.00    Additional pack years: 0.00    Total pack years: 12.50    Types: Cigarettes    Passive exposure: Current   Smokeless tobacco: Never   Tobacco comments:    Slightly under 1 PPD  Vaping Use   Vaping Use: Never used  Substance Use Topics   Alcohol use: Not Currently   Drug use: Never    Family History  Problem Relation Age of Onset   COPD Mother    Heart attack Father    Stroke Father    Diabetes Sister    Cancer Maternal Aunt        breast & uterine   Breast cancer Maternal Aunt 60   Heart Problems Maternal Grandmother     Review of Systems: A 12-system review of systems was performed and was negative except as noted in the HPI.  --------------------------------------------------------------------------------------------------  Physical Exam: BP 128/78 (BP Location: Left Arm, Patient Position: Sitting, Cuff Size: Normal)   Pulse (!) 52   Ht 5' (1.524 m)   Wt 165 lb 6 oz (75 kg)   SpO2 93%   BMI 32.30 kg/m   General:  NAD. Neck: No JVD or HJR. Lungs: Clear to auscultation bilaterally without wheezes or  crackles. Heart: Regular rate and rhythm without murmurs, rubs, or gallops. Abdomen: Soft, nontender, nondistended. Extremities: No lower extremity edema.  EKG: Normal sinus rhythm with nonspecific T wave changes.  No significant change since 06/20/2022.  Lab Results  Component Value Date   WBC 6.1 10/20/2014   HGB 14.3 10/20/2014   HCT 42.5 10/20/2014   MCV 91.9 10/20/2014   PLT 221.0 10/20/2014    Lab Results  Component Value Date   NA 138 06/20/2022   K 3.5 06/20/2022   CL 103 06/20/2022   CO2 28 06/20/2022   BUN 14 06/20/2022   CREATININE 0.74 06/20/2022   GLUCOSE 104 (H) 06/20/2022   ALT 20 10/20/2014    Lab Results  Component Value Date   CHOL 236 (H) 10/20/2014   HDL 36.80 (L) 10/20/2014   LDLCALC 106 (H) 04/29/2012   LDLDIRECT 161.0 10/20/2014   TRIG 209.0 (H) 10/20/2014   CHOLHDL 6 10/20/2014    --------------------------------------------------------------------------------------------------  ASSESSMENT AND PLAN: Coronary artery disease with angina: Ms. Arnold reports some vague episodes of chest discomfort.  In the setting of her significant three-vessel CAD identified on recent coronary CTA, I worry that these are related to her ischemic heart disease despite the somewhat atypical nature.  I have recommended that we move forward with cardiac catheterization and possible PCI as soon as possible.  We will arrange for this to be done next week at [facility].  In the meantime, she should continue antianginal therapy with amlodipine and carvedilol.  I have also provided her with a prescription for sublingual nitroglycerin.  We will continue aspirin and rosuvastatin for secondary prevention.  I have reached out to Dr. Pattricia Boss at [facility] to discuss whether dual antiplatelet therapy would preclude moving forward with her aneurysm repair [facility].  Of note, based on her CTA results I worry that we may ultimately need to consider CABG, which would further complicate management of  her aneurysm.  Thoracicoabdominal aortic aneurysm: Ms. Senter continues to undergo evaluation at [facility] with planned staged endovascular repair of her thoracoabdominal aortic aneurysm at [facility].  She is scheduled for preop evaluation by anesthesia next week.  I have advised her that in the setting of her abnormal coronary CTA and chest pain, that she would be high risk for any elective surgery and that further evaluation with cardiac catheterization is indicated  Hypertension: Blood pressure reasonable today.  No medication changes.  Hyperlipidemia: Continue rosuvastatin 20 mg daily neck for now, though would favor escalation to 40 mg daily given most recent LDL of 78 in the setting of CAD and D.  We will readdress this after catheterization next week.  Shared Decision Making/Informed Consent The risks [stroke (1 in 1000), death (1 in 1000), kidney failure [usually temporary] (1 in 500), bleeding (1 in 200), allergic reaction [possibly serious] (1 in 200)], benefits (diagnostic support and management of coronary artery disease) and alternatives of a cardiac catheterization were discussed in detail with Ms. Zuckerman and she is willing to proceed.  Follow-up: Return to clinic in 3 weeks.  Yvonne Kendall, MD 07/19/2022 10:29 AM

## 2022-07-19 NOTE — Progress Notes (Addendum)
Follow-up Outpatient Visit Date: 07/19/2022  Primary Care Provider: Jerl Mina, MD [address] [facility] [place] [address]  Chief Complaint: Chest pain and abnormal coronary CTA  HPI:  Ms. Poppell is a 67 y.o. female with history of thoracoabdominal aortic aneurysm followed by Drs. Randie Heinz ([facility]) and Pattricia Boss [facility]), hypertension, hyperlipidemia, hypothyroidism, who presents for follow-up of chest pain and shortness of breath with abnormal coronary CTA.  I met Ms. Shea a month ago for evaluation of chest pain and shortness of breath.  Symptoms began last fall but improved considerably with addition of carvedilol by Dr. Burnett Sheng.  Has a known large thoracicoabdominal aortic aneurysm and is currently undergoing workup for FEVAR are by Dr. Pattricia Boss at [facility].  We agreed to perform a coronary CTA, which showed significant three-vessel CAD.  Interval echocardiogram at [facility] showed normal LVEF without significant valvular abnormalities.  Today, Mess reports that she feels similar to prior visits.  However, the pain that previously seem to be localized more in her left flank now seems to have moved up to her chest.  She has had a few episodes of sharp pain under the left breast.  It sometimes comes on after she has been standing or walking, though she is also noted it after eating.  It takes her breath away for a few seconds.  She notes an episode of vague chest discomfort and dyspnea accompanied by feeling that she needed to "spit up" while at her pharmacy.  She rested for about 15 minutes and then was able to walk to her car.  Symptoms then resolved.  She reports that she is scheduled for preop visit at [facility] next week in anticipation of her FEVAR.  She reports that this would be done in a staged procedure with the initial repair being done later this month.  --------------------------------------------------------------------------------------------------  Cardiovascular History &  Procedures: Cardiovascular Problems: Dyspnea on exertion Thoracoabdominal aortic aneurysm Coronary artery disease   Risk Factors: Aortic aneurysm, hypertension, hyperlipidemia, diabetes mellitus, obesity tobacco use, and age > 68   Cath/PCI: None   CV Surgery: None   EP Procedures and Devices: Coronary CTA (07/12/2022): Right dominant coronary arteries with 50-69% proximal LAD stenosis and 25-49% mid LCx stenoses.  CT FFR significant in both vessels (0.71 in mid LAD and 0.77 in mid LCx).  Proximal to mid RCA appears chronically occluded.  Coronary calcium score 1361 (99th percentile). TTE (07/10/2022, [facility]): Normal LV size wall thickness.  LVEF greater than 55%.  Normal RV size and function.  Mild aortic regurgitation.   Non-Invasive Evaluation(s): None  Recent CV Pertinent Labs: Lab Results  Component Value Date   CHOL 236 (H) 10/20/2014   HDL 36.80 (L) 10/20/2014   LDLCALC 106 (H) 04/29/2012   LDLDIRECT 161.0 10/20/2014   TRIG 209.0 (H) 10/20/2014   CHOLHDL 6 10/20/2014   K 3.5 06/20/2022   BUN 14 06/20/2022   CREATININE 0.74 06/20/2022    Past medical and surgical history were reviewed and updated in EPIC.  Current Meds  Medication Sig   albuterol (VENTOLIN HFA) 108 (90 BASE) MCG/ACT inhaler Inhale 1-2 puffs into the lungs every 6 (six) hours as needed for wheezing or shortness of breath.   amLODipine (NORVASC) 10 MG tablet Take 1 tablet (10 mg total) by mouth daily.   aspirin EC 81 MG tablet Take 81 mg by mouth daily. Swallow whole.   carvedilol (COREG) 6.25 MG tablet Take 6.25 mg by mouth 2 (two) times daily with a meal.  clobetasol (OLUX) 0.05 % topical foam Apply topically 2 (two) times daily. For up to 2 weeks as needed. Avoid applying to face, groin, and axilla. Use as directed. Long-term use can cause thinning of the skin.   fluticasone (FLONASE) 50 MCG/ACT nasal spray Place 2 sprays into both nostrils daily as needed for allergies.   ibuprofen (ADVIL,MOTRIN) 200  MG tablet Take 200 mg by mouth every 8 (eight) hours as needed for moderate pain. OTC as directed   levothyroxine (SYNTHROID, LEVOTHROID) 75 MCG tablet Take 1 tablet (75 mcg total) by mouth daily.   rosuvastatin (CRESTOR) 20 MG tablet Take 1 tablet (20 mg total) by mouth daily.   tacrolimus (PROTOPIC) 0.1 % ointment Apply topically 2 (two) times daily.   triamcinolone ointment (KENALOG) 0.1 % apply twice daily as needed to affected areas for two weeks then stop. Avoid applying to face, groin, and axilla. Use as directed. Long-term use can cause thinning of the skin.   Varenicline Tartrate,Continue, 1 MG TABS 0.5 mg daily.   venlafaxine XR (EFFEXOR-XR) 150 MG 24 hr capsule Take 1 capsule (150 mg total) by mouth daily.    Allergies: Patient has no known allergies.  Social History   Tobacco Use   Smoking status: Every Day    Packs/day: 0.25    Years: 50.00    Additional pack years: 0.00    Total pack years: 12.50    Types: Cigarettes    Passive exposure: Current   Smokeless tobacco: Never   Tobacco comments:    Slightly under 1 PPD  Vaping Use   Vaping Use: Never used  Substance Use Topics   Alcohol use: Not Currently   Drug use: Never    Family History  Problem Relation Age of Onset   COPD Mother    Heart attack Father    Stroke Father    Diabetes Sister    Cancer Maternal Aunt        breast & uterine   Breast cancer Maternal Aunt 60   Heart Problems Maternal Grandmother     Review of Systems: A 12-system review of systems was performed and was negative except as noted in the HPI.  --------------------------------------------------------------------------------------------------  Physical Exam: BP 128/78 (BP Location: Left Arm, Patient Position: Sitting, Cuff Size: Normal)   Pulse (!) 52   Ht 5' (1.524 m)   Wt 165 lb 6 oz (75 kg)   SpO2 93%   BMI 32.30 kg/m   General:  NAD. Neck: No JVD or HJR. Lungs: Clear to auscultation bilaterally without wheezes or  crackles. Heart: Regular rate and rhythm without murmurs, rubs, or gallops. Abdomen: Soft, nontender, nondistended. Extremities: No lower extremity edema.  EKG: Normal sinus rhythm with nonspecific T wave changes.  No significant change since 06/20/2022.  Lab Results  Component Value Date   WBC 6.1 10/20/2014   HGB 14.3 10/20/2014   HCT 42.5 10/20/2014   MCV 91.9 10/20/2014   PLT 221.0 10/20/2014    Lab Results  Component Value Date   NA 138 06/20/2022   K 3.5 06/20/2022   CL 103 06/20/2022   CO2 28 06/20/2022   BUN 14 06/20/2022   CREATININE 0.74 06/20/2022   GLUCOSE 104 (H) 06/20/2022   ALT 20 10/20/2014    Lab Results  Component Value Date   CHOL 236 (H) 10/20/2014   HDL 36.80 (L) 10/20/2014   LDLCALC 106 (H) 04/29/2012   LDLDIRECT 161.0 10/20/2014   TRIG 209.0 (H) 10/20/2014   CHOLHDL 6 10/20/2014    --------------------------------------------------------------------------------------------------  ASSESSMENT AND PLAN: Coronary artery disease with angina: Ms. East reports some vague episodes of chest discomfort.  In the setting of her significant three-vessel CAD identified on recent coronary CTA, I worry that these are related to her ischemic heart disease despite the somewhat atypical nature.  I have recommended that we move forward with cardiac catheterization and possible PCI as soon as possible.  We will arrange for this to be done next week at [facility].  In the meantime, she should continue antianginal therapy with amlodipine and carvedilol.  I have also provided her with a prescription for sublingual nitroglycerin.  We will continue aspirin and rosuvastatin for secondary prevention.  I have reached out to Dr. Pattricia Boss at [facility] to discuss whether dual antiplatelet therapy would preclude moving forward with her aneurysm repair [facility].  Of note, based on her CTA results I worry that we may ultimately need to consider CABG, which would further complicate management of  her aneurysm.  Thoracicoabdominal aortic aneurysm: Ms. Speakes continues to undergo evaluation at [facility] with planned staged endovascular repair of her thoracoabdominal aortic aneurysm at [facility].  She is scheduled for preop evaluation by anesthesia next week.  I have advised her that in the setting of her abnormal coronary CTA and chest pain, that she would be high risk for any elective surgery and that further evaluation with cardiac catheterization is indicated  Hypertension: Blood pressure reasonable today.  No medication changes.  Hyperlipidemia: Continue rosuvastatin 20 mg daily neck for now, though would favor escalation to 40 mg daily given most recent LDL of 78 in the setting of CAD and D.  We will readdress this after catheterization next week.  Shared Decision Making/Informed Consent The risks [stroke (1 in 1000), death (1 in 1000), kidney failure [usually temporary] (1 in 500), bleeding (1 in 200), allergic reaction [possibly serious] (1 in 200)], benefits (diagnostic support and management of coronary artery disease) and alternatives of a cardiac catheterization were discussed in detail with Ms. Dinh and she is willing to proceed.  Follow-up: Return to clinic in 3 weeks.  Yvonne Kendall, MD 07/19/2022 10:29 AM

## 2022-07-19 NOTE — Patient Instructions (Signed)
Medication Instructions:  For as needed Nitroglycerin, if you develop chest pain: Sit and rest 5 minutes. If chest pain does not resolve place 1 nitroglycerin under your tongue and wait 5 minutes. If chest pain does not resolve, place a 2nd nitroglycerin under your tongue and wait 5 more minutes. If chest pain does not resolve, place a 3rd nitroglycerin under your tongue and seek emergency services.   *If you need a refill on your cardiac medications before your next appointment, please call your pharmacy*   Lab Work: Your provider would like for you to have following labs drawn: (CBC, BMP).   Please go to the [facility] entrance and check in at the front desk.  You do not need an appointment.  They are open from 7am-6 pm.   If you have labs (blood work) drawn today and your tests are completely normal, you will receive your results only by: MyChart Message (if you have MyChart) OR A paper copy in the mail If you have any lab test that is abnormal or we need to change your treatment, we will call you to review the results.   Testing/Procedures: Your physician has requested that you have a cardiac catheterization. Cardiac catheterization is used to diagnose and/or treat various heart conditions. Doctors may recommend this procedure for a number of different reasons. The most common reason is to evaluate chest pain. Chest pain can be a symptom of coronary artery disease (CAD), and cardiac catheterization can show whether plaque is narrowing or blocking your heart's arteries. This procedure is also used to evaluate the valves, as well as measure the blood flow and oxygen levels in different parts of your heart. For further information please visit [email or web address] Please follow instruction sheet, as given.  Please see instructions below  Follow-Up: At [facility], you and your health needs are our priority.  As part of our continuing mission to provide you with exceptional  heart care, we have created designated Provider Care Teams.  These Care Teams include your primary Cardiologist (physician) and Advanced Practice Providers (APPs -  Physician Assistants and Nurse Practitioners) who all work together to provide you with the care you need, when you need it.  We recommend signing up for the patient portal called "MyChart".  Sign up information is provided on this After Visit Summary.  MyChart is used to connect with patients for Virtual Visits (Telemedicine).  Patients are able to view lab/test results, encounter notes, upcoming appointments, etc.  Non-urgent messages can be sent to your provider as well.   To learn more about what you can do with MyChart, go to [email or web address].    Your next appointment:   2 -3 week(s)  Provider:   You may see Yvonne Kendall, MD or one of the following Advanced Practice Providers on your designated Care Team:   Nicolasa Ducking, NP Eula Listen, PA-C Cadence Fransico Michael, PA-C Charlsie Quest, NP      [facility] A DEPT OF [place]. [facility] AT [facility] [address] [place] [address] Dept: [phone] Loc: [phone]  REGLA MINK  07/19/2022  You are scheduled for a Cardiac Catheterization on Monday, April 15 with Dr. Cristal [place].  1. Please arrive at the [facility] (Main Entrance A) at [facility]: [address] [place], [place] [address] at 8:30 AM (This time is two hours before your procedure to ensure your preparation). Free valet parking service is available.  Special note: Every effort is made to have your procedure done on time. Please understand that emergencies sometimes delay scheduled procedures.  2. Diet: Do not eat solid foods after midnight.  The patient may have clear liquids until 5am upon the day of the procedure.  3. Labs: You will need to have blood drawn today (CBC, BMP  4. Medication instructions in  preparation for your procedure:   Contrast Allergy: No   On the morning of your procedure, take your Aspirin 81 mg and any morning medicines NOT listed above.  You may use sips of water.  5. Plan to go home the same day, you will only stay overnight if medically necessary. 6. Bring a current list of your medications and current insurance cards. 7. You MUST have a responsible person to drive you home. 8. Someone MUST be with you the first 24 hours after you arrive home or your discharge will be delayed. 9. Please wear clothes that are easy to get on and off and wear slip-on shoes.  Thank you for allowing [place] to care for you!   -- [place] [facility]

## 2022-07-20 ENCOUNTER — Telehealth: Payer: Self-pay | Admitting: *Deleted

## 2022-07-20 NOTE — Telephone Encounter (Signed)
Cardiac Catheterization scheduled at [facility] for: Monday July 23, 2022 10:30 AM Arrival time [facility] Main Entrance A at: 8:30 AM  Nothing to eat after midnight prior to procedure, clear liquids until 5 AM day of procedure.  Medication instructions: -Usual morning medications can be taken with sips of water including aspirin 81 mg.  Confirmed patient has responsible adult to drive home post procedure and be with patient first 24 hours after arriving home.  Plan to go home the same day, you will only stay overnight if medically necessary.  Reviewed procedure instructions with patient.

## 2022-07-23 ENCOUNTER — Inpatient Hospital Stay (HOSPITAL_COMMUNITY)
Admission: AD | Admit: 2022-07-23 | Discharge: 2022-08-02 | DRG: 234 | Disposition: A | Discharge status: Home / Self Care | Payer: Medicare HMO | Source: Ambulatory Visit | Attending: Surgery | Admitting: Surgery

## 2022-07-23 ENCOUNTER — Inpatient Hospital Stay (HOSPITAL_COMMUNITY): Payer: Medicare HMO

## 2022-07-23 ENCOUNTER — Encounter (HOSPITAL_COMMUNITY)
Admission: AD | Disposition: A | Discharge status: Home / Self Care | Payer: Self-pay | Source: Ambulatory Visit | Attending: Surgery

## 2022-07-23 ENCOUNTER — Encounter (HOSPITAL_COMMUNITY): Payer: Self-pay | Admitting: Internal Medicine

## 2022-07-23 ENCOUNTER — Other Ambulatory Visit: Payer: Self-pay

## 2022-07-23 DIAGNOSIS — Z6832 Body mass index (BMI) 32.0-32.9, adult: Secondary | ICD-10-CM | POA: Diagnosis not present

## 2022-07-23 DIAGNOSIS — I10 Essential (primary) hypertension: Secondary | ICD-10-CM | POA: Diagnosis present

## 2022-07-23 DIAGNOSIS — I25119 Atherosclerotic heart disease of native coronary artery with unspecified angina pectoris: Secondary | ICD-10-CM | POA: Diagnosis not present

## 2022-07-23 DIAGNOSIS — E785 Hyperlipidemia, unspecified: Secondary | ICD-10-CM | POA: Diagnosis present

## 2022-07-23 DIAGNOSIS — F419 Anxiety disorder, unspecified: Secondary | ICD-10-CM | POA: Diagnosis present

## 2022-07-23 DIAGNOSIS — Z79899 Other long term (current) drug therapy: Secondary | ICD-10-CM | POA: Diagnosis not present

## 2022-07-23 DIAGNOSIS — Z1152 Encounter for screening for COVID-19: Secondary | ICD-10-CM | POA: Diagnosis not present

## 2022-07-23 DIAGNOSIS — R0902 Hypoxemia: Secondary | ICD-10-CM | POA: Diagnosis not present

## 2022-07-23 DIAGNOSIS — J449 Chronic obstructive pulmonary disease, unspecified: Secondary | ICD-10-CM | POA: Diagnosis present

## 2022-07-23 DIAGNOSIS — F17210 Nicotine dependence, cigarettes, uncomplicated: Secondary | ICD-10-CM | POA: Diagnosis present

## 2022-07-23 DIAGNOSIS — Z720 Tobacco use: Secondary | ICD-10-CM

## 2022-07-23 DIAGNOSIS — I2582 Chronic total occlusion of coronary artery: Secondary | ICD-10-CM | POA: Diagnosis present

## 2022-07-23 DIAGNOSIS — I25118 Atherosclerotic heart disease of native coronary artery with other forms of angina pectoris: Principal | ICD-10-CM | POA: Diagnosis present

## 2022-07-23 DIAGNOSIS — E039 Hypothyroidism, unspecified: Secondary | ICD-10-CM | POA: Diagnosis present

## 2022-07-23 DIAGNOSIS — Z8249 Family history of ischemic heart disease and other diseases of the circulatory system: Secondary | ICD-10-CM

## 2022-07-23 DIAGNOSIS — K5900 Constipation, unspecified: Secondary | ICD-10-CM | POA: Diagnosis present

## 2022-07-23 DIAGNOSIS — E8770 Fluid overload, unspecified: Secondary | ICD-10-CM | POA: Diagnosis not present

## 2022-07-23 DIAGNOSIS — Z7982 Long term (current) use of aspirin: Secondary | ICD-10-CM | POA: Diagnosis not present

## 2022-07-23 DIAGNOSIS — I716 Thoracoabdominal aortic aneurysm, without rupture, unspecified: Secondary | ICD-10-CM | POA: Diagnosis present

## 2022-07-23 DIAGNOSIS — Z79890 Hormone replacement therapy (postmenopausal): Secondary | ICD-10-CM | POA: Diagnosis not present

## 2022-07-23 DIAGNOSIS — I2510 Atherosclerotic heart disease of native coronary artery without angina pectoris: Secondary | ICD-10-CM

## 2022-07-23 DIAGNOSIS — Z951 Presence of aortocoronary bypass graft: Secondary | ICD-10-CM

## 2022-07-23 DIAGNOSIS — Z833 Family history of diabetes mellitus: Secondary | ICD-10-CM

## 2022-07-23 DIAGNOSIS — Z803 Family history of malignant neoplasm of breast: Secondary | ICD-10-CM

## 2022-07-23 DIAGNOSIS — D62 Acute posthemorrhagic anemia: Secondary | ICD-10-CM | POA: Diagnosis not present

## 2022-07-23 DIAGNOSIS — Z823 Family history of stroke: Secondary | ICD-10-CM

## 2022-07-23 DIAGNOSIS — E669 Obesity, unspecified: Secondary | ICD-10-CM | POA: Diagnosis present

## 2022-07-23 DIAGNOSIS — Z825 Family history of asthma and other chronic lower respiratory diseases: Secondary | ICD-10-CM

## 2022-07-23 HISTORY — PX: LEFT HEART CATH AND CORONARY ANGIOGRAPHY: CATH118249

## 2022-07-23 LAB — CBC
HCT: 41.2 % (ref 36.0–46.0)
Hemoglobin: 13 g/dL (ref 12.0–15.0)
MCH: 29.1 pg (ref 26.0–34.0)
MCHC: 31.6 g/dL (ref 30.0–36.0)
MCV: 92.2 fL (ref 80.0–100.0)
Platelets: 212 10*3/uL (ref 150–400)
RBC: 4.47 MIL/uL (ref 3.87–5.11)
RDW: 13.7 % (ref 11.5–15.5)
WBC: 6.6 10*3/uL (ref 4.0–10.5)
nRBC: 0 % (ref 0.0–0.2)

## 2022-07-23 LAB — ECHOCARDIOGRAM COMPLETE
Area-P 1/2: 4.68 cm2
Calc EF: 57 %
Height: 60 in
S' Lateral: 2.8 cm
Single Plane A2C EF: 54.5 %
Single Plane A4C EF: 59.2 %
Weight: 2640 oz

## 2022-07-23 LAB — CREATININE, SERUM
Creatinine, Ser: 0.62 mg/dL (ref 0.44–1.00)
GFR, Estimated: >60 mL/min (ref >60)

## 2022-07-23 SURGERY — LEFT HEART CATH AND CORONARY ANGIOGRAPHY
Anesthesia: LOCAL

## 2022-07-23 MED ORDER — AMLODIPINE BESYLATE 10 MG PO TABS
10.0000 mg | ORAL_TABLET | Freq: Every day | ORAL | Status: DC
  Administered 2022-07-24 – 2022-07-26 (×3): 10 mg via ORAL
  Filled 2022-07-23 (×3): qty 1

## 2022-07-23 MED ORDER — LABETALOL HCL 5 MG/ML IV SOLN: 10.0000 mg | INTRAVENOUS | Status: AC | PRN

## 2022-07-23 MED ORDER — SODIUM CHLORIDE 0.9% FLUSH: 3.0000 mL | INTRAVENOUS | Status: DC | PRN

## 2022-07-23 MED ORDER — ONDANSETRON HCL 4 MG/2ML IJ SOLN: 4.0000 mg | Freq: Four times a day (QID) | INTRAMUSCULAR | Status: DC | PRN

## 2022-07-23 MED ORDER — VERAPAMIL HCL 2.5 MG/ML IV SOLN
INTRAVENOUS | Status: DC | PRN
  Administered 2022-07-23 (×2): 10 mL via INTRA_ARTERIAL

## 2022-07-23 MED ORDER — VARENICLINE TARTRATE 0.5 MG PO TABS
0.5000 mg | ORAL_TABLET | Freq: Every day | ORAL | Status: DC
  Filled 2022-07-23 (×4): qty 1

## 2022-07-23 MED ORDER — IOHEXOL 350 MG/ML SOLN
INTRAVENOUS | Status: DC | PRN
  Administered 2022-07-23: 45 mL

## 2022-07-23 MED ORDER — SODIUM CHLORIDE 0.9 % IV SOLN: 250.0000 mL | INTRAVENOUS | Status: DC | PRN

## 2022-07-23 MED ORDER — HEPARIN SODIUM (PORCINE) 1000 UNIT/ML IJ SOLN
INTRAMUSCULAR | Status: DC | PRN
  Administered 2022-07-23: 3500 [IU] via INTRAVENOUS

## 2022-07-23 MED ORDER — ALBUTEROL SULFATE (2.5 MG/3ML) 0.083% IN NEBU: 2.5000 mg | INHALATION_SOLUTION | Freq: Four times a day (QID) | RESPIRATORY_TRACT | Status: DC | PRN

## 2022-07-23 MED ORDER — ISOSORBIDE MONONITRATE ER 30 MG PO TB24
15.0000 mg | ORAL_TABLET | Freq: Every day | ORAL | Status: DC
  Administered 2022-07-24 – 2022-07-25 (×2): 15 mg via ORAL
  Filled 2022-07-23 (×2): qty 1

## 2022-07-23 MED ORDER — VENLAFAXINE HCL ER 150 MG PO CP24
150.0000 mg | ORAL_CAPSULE | Freq: Every day | ORAL | Status: DC
  Administered 2022-07-24 – 2022-07-26 (×3): 150 mg via ORAL
  Filled 2022-07-23 (×4): qty 1

## 2022-07-23 MED ORDER — SODIUM CHLORIDE 0.9 % WEIGHT BASED INFUSION: 1.0000 mL/kg/h | INTRAVENOUS | Status: DC

## 2022-07-23 MED ORDER — ASPIRIN 81 MG PO CHEW: 81.0000 mg | CHEWABLE_TABLET | ORAL | Status: DC

## 2022-07-23 MED ORDER — ALBUTEROL SULFATE HFA 108 (90 BASE) MCG/ACT IN AERS: 1.0000 | INHALATION_SPRAY | Freq: Four times a day (QID) | RESPIRATORY_TRACT | Status: DC | PRN

## 2022-07-23 MED ORDER — MIDAZOLAM HCL 2 MG/2ML IJ SOLN
INTRAMUSCULAR | Status: DC | PRN
  Administered 2022-07-23: 1 mg via INTRAVENOUS

## 2022-07-23 MED ORDER — FENTANYL CITRATE (PF) 100 MCG/2ML IJ SOLN
INTRAMUSCULAR | Status: AC
  Filled 2022-07-23: qty 2

## 2022-07-23 MED ORDER — ROSUVASTATIN CALCIUM 20 MG PO TABS
40.0000 mg | ORAL_TABLET | Freq: Every day | ORAL | Status: DC
  Administered 2022-07-24 – 2022-07-26 (×3): 40 mg via ORAL
  Filled 2022-07-23 (×3): qty 2

## 2022-07-23 MED ORDER — LIDOCAINE HCL (PF) 1 % IJ SOLN
INTRAMUSCULAR | Status: AC
  Filled 2022-07-23: qty 30

## 2022-07-23 MED ORDER — HEPARIN (PORCINE) IN NACL 1000-0.9 UT/500ML-% IV SOLN
INTRAVENOUS | Status: DC | PRN
  Administered 2022-07-23 (×2): 500 mL

## 2022-07-23 MED ORDER — FENTANYL CITRATE (PF) 100 MCG/2ML IJ SOLN
INTRAMUSCULAR | Status: DC | PRN
  Administered 2022-07-23: 50 ug via INTRAVENOUS

## 2022-07-23 MED ORDER — SODIUM CHLORIDE 0.9 % IV SOLN: INTRAVENOUS | Status: AC

## 2022-07-23 MED ORDER — HEPARIN SODIUM (PORCINE) 1000 UNIT/ML IJ SOLN
INTRAMUSCULAR | Status: AC
  Filled 2022-07-23: qty 10

## 2022-07-23 MED ORDER — NICOTINE 21 MG/24HR TD PT24
21.0000 mg | MEDICATED_PATCH | Freq: Every day | TRANSDERMAL | Status: DC
  Filled 2022-07-23 (×3): qty 1

## 2022-07-23 MED ORDER — ASPIRIN 81 MG PO TBEC
81.0000 mg | DELAYED_RELEASE_TABLET | Freq: Every day | ORAL | Status: DC
  Administered 2022-07-24 – 2022-07-26 (×3): 81 mg via ORAL
  Filled 2022-07-23 (×3): qty 1

## 2022-07-23 MED ORDER — FLUTICASONE PROPIONATE 50 MCG/ACT NA SUSP: 2.0000 | Freq: Every day | NASAL | Status: DC | PRN

## 2022-07-23 MED ORDER — MIDAZOLAM HCL 2 MG/2ML IJ SOLN
INTRAMUSCULAR | Status: AC
  Filled 2022-07-23: qty 2

## 2022-07-23 MED ORDER — NITROGLYCERIN 0.4 MG SL SUBL: 0.4000 mg | SUBLINGUAL_TABLET | SUBLINGUAL | Status: DC | PRN

## 2022-07-23 MED ORDER — SODIUM CHLORIDE 0.9 % WEIGHT BASED INFUSION
3.0000 mL/kg/h | INTRAVENOUS | Status: DC
  Administered 2022-07-23: 3 mL/kg/h via INTRAVENOUS

## 2022-07-23 MED ORDER — VERAPAMIL HCL 2.5 MG/ML IV SOLN
INTRAVENOUS | Status: AC
  Filled 2022-07-23: qty 2

## 2022-07-23 MED ORDER — LEVOTHYROXINE SODIUM 75 MCG PO TABS
75.0000 ug | ORAL_TABLET | Freq: Every day | ORAL | Status: DC
  Administered 2022-07-24 – 2022-07-27 (×4): 75 ug via ORAL
  Filled 2022-07-23 (×4): qty 1

## 2022-07-23 MED ORDER — SODIUM CHLORIDE 0.9% FLUSH
3.0000 mL | INTRAVENOUS | Status: DC | PRN
  Administered 2022-07-24 (×2): 3 mL via INTRAVENOUS

## 2022-07-23 MED ORDER — CARVEDILOL 6.25 MG PO TABS
6.2500 mg | ORAL_TABLET | Freq: Two times a day (BID) | ORAL | Status: DC
  Administered 2022-07-24 – 2022-07-26 (×6): 6.25 mg via ORAL
  Filled 2022-07-23 (×6): qty 1

## 2022-07-23 MED ORDER — SODIUM CHLORIDE 0.9% FLUSH
3.0000 mL | Freq: Two times a day (BID) | INTRAVENOUS | Status: DC
  Administered 2022-07-23 – 2022-07-26 (×7): 3 mL via INTRAVENOUS

## 2022-07-23 MED ORDER — SODIUM CHLORIDE 0.9% FLUSH: 3.0000 mL | Freq: Two times a day (BID) | INTRAVENOUS | Status: DC

## 2022-07-23 MED ORDER — HYDRALAZINE HCL 20 MG/ML IJ SOLN: 10.0000 mg | INTRAMUSCULAR | Status: AC | PRN

## 2022-07-23 MED ORDER — ENOXAPARIN SODIUM 40 MG/0.4ML IJ SOSY
40.0000 mg | PREFILLED_SYRINGE | INTRAMUSCULAR | Status: DC
  Administered 2022-07-24 – 2022-07-26 (×3): 40 mg via SUBCUTANEOUS
  Filled 2022-07-23 (×3): qty 0.4

## 2022-07-23 MED ORDER — LIDOCAINE HCL (PF) 1 % IJ SOLN
INTRAMUSCULAR | Status: DC | PRN
  Administered 2022-07-23: 2 mL

## 2022-07-23 MED ORDER — ACETAMINOPHEN 325 MG PO TABS
650.0000 mg | ORAL_TABLET | ORAL | Status: DC | PRN
  Administered 2022-07-25 (×2): 650 mg via ORAL
  Filled 2022-07-23 (×2): qty 2

## 2022-07-23 SURGICAL SUPPLY — 10 items
CATH 5FR JL3.5 JR4 ANG PIG MP (CATHETERS) IMPLANT
DEVICE RAD TR BAND REGULAR (VASCULAR PRODUCTS) IMPLANT
GLIDESHEATH SLEND SS 6F .021 (SHEATH) IMPLANT
GUIDEWIRE INQWIRE 1.5J.035X260 (WIRE) IMPLANT
GUIDEWIRE TIGER .035X300 (WIRE) IMPLANT
INQWIRE 1.5J .035X260CM (WIRE) ×1
KIT HEART LEFT (KITS) ×2 IMPLANT
PACK CARDIAC CATHETERIZATION (CUSTOM PROCEDURE TRAY) ×2 IMPLANT
TRANSDUCER W/STOPCOCK (MISCELLANEOUS) ×2 IMPLANT
TUBING CIL FLEX 10 FLL-RA (TUBING) ×2 IMPLANT

## 2022-07-23 NOTE — Progress Notes (Signed)
TR band removed: Level 0, insertion site covered with gauze and transparent dressing, no swelling, redness, no pain upon palpation.

## 2022-07-23 NOTE — Progress Notes (Signed)
Echocardiogram 2D Echocardiogram has been performed.  Toni Amend 07/23/2022, 2:40 PM

## 2022-07-23 NOTE — Interval H&P Note (Signed)
History and Physical Interval Note:  07/23/2022 10:14 AM  Kaylee Pope  has presented today for surgery, with the diagnosis of chest pain and abnormal coronary CTA.  The various methods of treatment have been discussed with the patient and family. After consideration of risks, benefits and other options for treatment, the patient has consented to  Procedure(s): LEFT HEART CATH AND CORONARY ANGIOGRAPHY (N/A) as a surgical intervention.  The patient's history has been reviewed, patient examined, no change in status, stable for surgery.  I have reviewed the patient's chart and labs.  Questions were answered to the patient's satisfaction.    Cath Lab Visit (complete for each Cath Lab visit)  Clinical Evaluation Leading to the Procedure:   ACS: No.  Non-ACS:    Anginal Classification: CCS IV  Anti-ischemic medical therapy: Maximal Therapy (2 or more classes of medications)  Non-Invasive Test Results: Multivessel CAD on coronary CTA - high risk  Prior CABG: No previous CABG  Kaylee Pope

## 2022-07-23 NOTE — Plan of Care (Signed)

## 2022-07-23 NOTE — Progress Notes (Signed)
Ns at 75 cc/hr infusing on pump

## 2022-07-23 NOTE — Consult Note (Cosign Needed)
[address]       [place] [address]             [phone]        Kaylee Pope [facility] Medical Record #[id number] Date of Birth: 1955-09-24  Referring: No ref. provider found Primary Care: Jerl Mina, MD Primary Cardiologist:Christopher End, MD  Chief Complaint: Chest pain and abnormal coronary CTA  History of Present Illness:    We are asked to see this 67 year old female in CT surgical consultation for consideration of coronary artery surgical revascularization.  The patient has a known thoracoabdominal aortic aneurysm followed by Dr. Randie Heinz with [facility] and Dr. Pattricia Boss at [facility].  She has additional cardiac risk factors including hypertension and hyperlipidemia.  Recently she has describes some somewhat vague episodes of chest discomfort.  She describes pain typically felt in her left flank in the past which has moved to her chest.Sometimes it is sharp and sometimes dull. Also she has had episodes of tachycardia but no palpitations. Occasionally she would have diaphoresis with the events.   She notes symptoms with activity but also occasionally after eating.  There is some mild associated dyspnea.  A chest CTA identified three-vessel CAD and she was felt to require further evaluation to include cardiac catheterization.  This was performed on today's date by Dr. Okey Dupre and she was found to have severe three-vessel disease.  Echocardiogram reportedly done at [facility] showed normal LV function.  It is noted that she was tentatively scheduled to have her endovascular repair of the thoracoabdominal aortic aneurysm at [facility] next week.  She is also noted to be an everyday smoker approximately 1/4 pack/day x 50 years.  She has recently begun Chantix for smoking cessation.    Current Activity/ Functional Status: Patient is independent with mobility/ambulation, transfers, ADL's, IADL's.   Zubrod Score: At the time of surgery this patient's most appropriate activity status/level  should be described as:     0    Normal activity, no symptoms     1    Restricted in physical strenuous activity but ambulatory, able to do out light work     2    Ambulatory and capable of self care, unable to do work activities, up and about                 more than 50%  Of the time                                3    Only limited self care, in bed greater than 50% of waking hours     4    Completely disabled, no self care, confined to bed or chair     5    Moribund  Past Medical History:  Diagnosis Date   Allergy    Hyperlipidemia    Hypertension    Thoracoabdominal aortic aneurysm    Thyroid disease     Past Surgical History:  Procedure Laterality Date   TONSILLECTOMY     age 69    Social History   Tobacco Use  Smoking Status Every Day   Packs/day: 0.25   Years: 50.00   Additional pack years: 0.00   Total pack years: 12.50   Types: Cigarettes   Passive exposure: Current  Smokeless Tobacco Never  Tobacco Comments   Slightly under 1 PPD    Social History  Substance and Sexual Activity  Alcohol Use Not Currently     No Known Allergies  Current Facility-Administered Medications  Medication Dose Route Frequency Provider Last Rate Last Admin   0.9 %  sodium chloride infusion  250 mL Intravenous PRN End, Cristal Deer, MD       0.9% sodium chloride infusion  1 mL/kg/hr Intravenous Continuous End, Christopher, MD 75 mL/hr at 07/23/22 0856 1 mL/kg/hr at 07/23/22 0856   aspirin chewable tablet 81 mg  81 mg Oral Pre-Cath End, Cristal Deer, MD       fentaNYL (SUBLIMAZE) injection    PRN End, Cristal Deer, MD   50 mcg at 07/23/22 1015   Heparin (Porcine) in NaCl 1000-0.9 UT/500ML-% SOLN    PRN End, Cristal Deer, MD   500 mL at 07/23/22 1001   heparin sodium (porcine) injection    PRN End, Cristal Deer, MD   3,500 Units at 07/23/22 1026   iohexol (OMNIPAQUE) 350 MG/ML injection    PRN End, Cristal Deer, MD   45 mL at 07/23/22 1038   lidocaine (PF) (XYLOCAINE) 1 %  injection    PRN End, Cristal Deer, MD   2 mL at 07/23/22 1018   midazolam (VERSED) injection    PRN End, Cristal Deer, MD   1 mg at 07/23/22 1015   Radial Cocktail/Verapamil only    PRN End, Cristal Deer, MD   10 mL at 07/23/22 1036   sodium chloride flush (NS) 0.9 % injection 3 mL  3 mL Intravenous Q12H End, Cristal Deer, MD       sodium chloride flush (NS) 0.9 % injection 3 mL  3 mL Intravenous PRN End, Cristal Deer, MD        Medications Prior to Admission  Medication Sig Dispense Refill Last Dose   amLODipine (NORVASC) 10 MG tablet Take 1 tablet (10 mg total) by mouth daily. 90 tablet 0 07/23/2022   aspirin EC 81 MG tablet Take 81 mg by mouth daily. Swallow whole.   07/23/2022 at 0600   carvedilol (COREG) 6.25 MG tablet Take 6.25 mg by mouth 2 (two) times daily with a meal.   07/23/2022   ibuprofen (ADVIL,MOTRIN) 200 MG tablet Take 200 mg by mouth every 8 (eight) hours as needed for moderate pain. OTC as directed   07/22/2022   levothyroxine (SYNTHROID, LEVOTHROID) 75 MCG tablet Take 1 tablet (75 mcg total) by mouth daily. 90 tablet 0 07/23/2022   rosuvastatin (CRESTOR) 20 MG tablet Take 1 tablet (20 mg total) by mouth daily. 90 tablet 3 07/22/2022   varenicline (CHANTIX) 1 MG tablet Take 0.5 mg by mouth daily.   07/22/2022   venlafaxine XR (EFFEXOR-XR) 150 MG 24 hr capsule Take 1 capsule (150 mg total) by mouth daily. 90 capsule 0 07/23/2022   albuterol (VENTOLIN HFA) 108 (90 BASE) MCG/ACT inhaler Inhale 1-2 puffs into the lungs every 6 (six) hours as needed for wheezing or shortness of breath. 1 Inhaler 0 More than a month   clobetasol (OLUX) 0.05 % topical foam Apply topically 2 (two) times daily. For up to 2 weeks as needed. Avoid applying to face, groin, and axilla. Use as directed. Long-term use can cause thinning of the skin. (Patient taking differently: Apply topically as needed.  as needed. Avoid applying to face, groin, and axilla. Use as directed. Long-term use can cause thinning of the skin.)  50 g 2 More than a month   fluticasone (FLONASE) 50 MCG/ACT nasal spray Place 2 sprays into both nostrils daily as needed for allergies.   More than a  month   nitroGLYCERIN (NITROSTAT) 0.4 MG SL tablet Place 1 tablet (0.4 mg total) under the tongue every 5 (five) minutes as needed for chest pain. No more than 3 doses in a day 25 tablet 3 Unknown   tacrolimus (PROTOPIC) 0.1 % ointment Apply topically 2 (two) times daily. (Patient not taking: Reported on 07/19/2022) 100 g 1 Unknown   triamcinolone ointment (KENALOG) 0.1 % apply twice daily as needed to affected areas for two weeks then stop. Avoid applying to face, groin, and axilla. Use as directed. Long-term use can cause thinning of the skin. 60 g 0 More than a month    Family History  Problem Relation Age of Onset   COPD Mother    Heart attack Father    Stroke Father    Diabetes Sister    Cancer Maternal Aunt        breast & uterine   Breast cancer Maternal Aunt 60   Heart Problems Maternal Grandmother      Review of Systems:   Review of Systems  Eyes:  Positive for blurred vision and discharge.       Bilat cateracts  Respiratory:  Positive for cough and shortness of breath.   Cardiovascular:  Positive for chest pain and leg swelling.  Gastrointestinal:  Positive for constipation and heartburn.  Genitourinary:  Positive for flank pain.  Skin:  Positive for rash.       Recent rash tx'd by derm , currently resolved. Was on legs.   Neurological:  Positive for headaches.        H/o temporal headaches- resolved with effexor dose increase  Endo/Heme/Allergies:  Bruises/bleeds easily.  Psychiatric/Behavioral:  Positive for depression. The patient does not have insomnia.        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 Resp: Fair air exchange throughout, unable to use sit up but she is at bedrest after catheter Back: Nontender to palpation Cardio: regular rate and rhythm, S1, S2 normal, no murmur, click, rub or gallop GI: soft, non-tender; bowel sounds normal; no masses,  no organomegaly Extremities: extremities normal, atraumatic, no cyanosis or edema and palpable DP bilaterally, absent PT pulses some varicosity findings on right lower extremity Neurologic: Grossly normal  Diagnostic Studies & Laboratory data:     Recent Radiology Findings:   CARDIAC CATHETERIZATION  Result Date: 07/23/2022 Conclusions: Severe three-vessel coronary artery disease including sequential 70-90% proximal and mid LAD stenoses with aneurysmal segment at the takeoff of large first diagonal branch, 70-80% proximal/mid LCx stenosis, and chronic total occlusion of mid RCA with left-to-right collaterals. Mildly elevated left ventricular filling pressure (LVEDP 22 mmHg).  LVEF known to be normal based on recent echocardiogram at [facility]. Recommendations: Admit for antianginal therapy, as progressive atypical left-sided chest pain is most likely the patient's anginal equivalent, as well as cardiac surgery consultation for CABG. Aggressive secondary prevention of coronary artery disease. Yvonne Kendall, MD  [facility]    I have independently reviewed the above radiologic studies and discussed with the patient   Recent Lab Findings: Lab Results  Component Value Date   WBC 6.4 07/19/2022   HGB 13.3 07/19/2022   HCT 42.5 07/19/2022   PLT 185 07/19/2022   GLUCOSE 96 07/19/2022   CHOL 236 (H)  10/20/2014   TRIG 209.0 (H) 10/20/2014   HDL 36.80 (L) 10/20/2014   LDLDIRECT 161.0 10/20/2014   LDLCALC 106 (H) 04/29/2012   ALT 20 10/20/2014   AST 16 10/20/2014   NA 139 07/19/2022   K 3.7 07/19/2022   CL 103 07/19/2022   CREATININE 0.68 07/19/2022   BUN 12 07/19/2022   CO2 28 07/19/2022   TSH 2.72 10/20/2014      Assessment / Plan: Severe three-vessel coronary artery disease History of tobacco abuse, probable COPD Hypertension History of thoracicoabdominal aneurysm with plans to repair at [facility] Upper lipidemia Hypothyroidism on Synthroid replacement  Plan: The patient and all relevant studies were reviewed by this surgeon to determine timing and further plan surgical intervention  I  spent 40 minutes counseling the patient face to face.   Rowe Clack, PA-C  07/23/2022 11:52 AM

## 2022-07-23 NOTE — H&P (View-Only) (Signed)
[address]       [place] [address]             [phone]        ITALY WARRINER [facility] Medical Record #[id number] Date of Birth: 1955-09-24  Referring: No ref. provider found Primary Care: Jerl Mina, MD Primary Cardiologist:Christopher End, MD  Chief Complaint: Chest pain and abnormal coronary CTA  History of Present Illness:    We are asked to see this 67 year old female in CT surgical consultation for consideration of coronary artery surgical revascularization.  The patient has a known thoracoabdominal aortic aneurysm followed by Dr. Randie Heinz with [facility] and Dr. Pattricia Boss at [facility].  She has additional cardiac risk factors including hypertension and hyperlipidemia.  Recently she has describes some somewhat vague episodes of chest discomfort.  She describes pain typically felt in her left flank in the past which has moved to her chest.Sometimes it is sharp and sometimes dull. Also she has had episodes of tachycardia but no palpitations. Occasionally she would have diaphoresis with the events.   She notes symptoms with activity but also occasionally after eating.  There is some mild associated dyspnea.  A chest CTA identified three-vessel CAD and she was felt to require further evaluation to include cardiac catheterization.  This was performed on today's date by Dr. Okey Dupre and she was found to have severe three-vessel disease.  Echocardiogram reportedly done at [facility] showed normal LV function.  It is noted that she was tentatively scheduled to have her endovascular repair of the thoracoabdominal aortic aneurysm at [facility] next week.  She is also noted to be an everyday smoker approximately 1/4 pack/day x 50 years.  She has recently begun Chantix for smoking cessation.    Current Activity/ Functional Status: Patient is independent with mobility/ambulation, transfers, ADL's, IADL's.   Zubrod Score: At the time of surgery this patient's most appropriate activity status/level  should be described as:     0    Normal activity, no symptoms     1    Restricted in physical strenuous activity but ambulatory, able to do out light work     2    Ambulatory and capable of self care, unable to do work activities, up and about                 more than 50%  Of the time                                3    Only limited self care, in bed greater than 50% of waking hours     4    Completely disabled, no self care, confined to bed or chair     5    Moribund  Past Medical History:  Diagnosis Date   Allergy    Hyperlipidemia    Hypertension    Thoracoabdominal aortic aneurysm    Thyroid disease     Past Surgical History:  Procedure Laterality Date   TONSILLECTOMY     age 69    Social History   Tobacco Use  Smoking Status Every Day   Packs/day: 0.25   Years: 50.00   Additional pack years: 0.00   Total pack years: 12.50   Types: Cigarettes   Passive exposure: Current  Smokeless Tobacco Never  Tobacco Comments   Slightly under 1 PPD    Social History  Substance and Sexual Activity  Alcohol Use Not Currently     No Known Allergies  Current Facility-Administered Medications  Medication Dose Route Frequency Provider Last Rate Last Admin   0.9 %  sodium chloride infusion  250 mL Intravenous PRN End, Cristal Deer, MD       0.9% sodium chloride infusion  1 mL/kg/hr Intravenous Continuous End, Christopher, MD 75 mL/hr at 07/23/22 0856 1 mL/kg/hr at 07/23/22 0856   aspirin chewable tablet 81 mg  81 mg Oral Pre-Cath End, Cristal Deer, MD       fentaNYL (SUBLIMAZE) injection    PRN End, Cristal Deer, MD   50 mcg at 07/23/22 1015   Heparin (Porcine) in NaCl 1000-0.9 UT/500ML-% SOLN    PRN End, Cristal Deer, MD   500 mL at 07/23/22 1001   heparin sodium (porcine) injection    PRN End, Cristal Deer, MD   3,500 Units at 07/23/22 1026   iohexol (OMNIPAQUE) 350 MG/ML injection    PRN End, Cristal Deer, MD   45 mL at 07/23/22 1038   lidocaine (PF) (XYLOCAINE) 1 %  injection    PRN End, Cristal Deer, MD   2 mL at 07/23/22 1018   midazolam (VERSED) injection    PRN End, Cristal Deer, MD   1 mg at 07/23/22 1015   Radial Cocktail/Verapamil only    PRN End, Cristal Deer, MD   10 mL at 07/23/22 1036   sodium chloride flush (NS) 0.9 % injection 3 mL  3 mL Intravenous Q12H End, Cristal Deer, MD       sodium chloride flush (NS) 0.9 % injection 3 mL  3 mL Intravenous PRN End, Cristal Deer, MD        Medications Prior to Admission  Medication Sig Dispense Refill Last Dose   amLODipine (NORVASC) 10 MG tablet Take 1 tablet (10 mg total) by mouth daily. 90 tablet 0 07/23/2022   aspirin EC 81 MG tablet Take 81 mg by mouth daily. Swallow whole.   07/23/2022 at 0600   carvedilol (COREG) 6.25 MG tablet Take 6.25 mg by mouth 2 (two) times daily with a meal.   07/23/2022   ibuprofen (ADVIL,MOTRIN) 200 MG tablet Take 200 mg by mouth every 8 (eight) hours as needed for moderate pain. OTC as directed   07/22/2022   levothyroxine (SYNTHROID, LEVOTHROID) 75 MCG tablet Take 1 tablet (75 mcg total) by mouth daily. 90 tablet 0 07/23/2022   rosuvastatin (CRESTOR) 20 MG tablet Take 1 tablet (20 mg total) by mouth daily. 90 tablet 3 07/22/2022   varenicline (CHANTIX) 1 MG tablet Take 0.5 mg by mouth daily.   07/22/2022   venlafaxine XR (EFFEXOR-XR) 150 MG 24 hr capsule Take 1 capsule (150 mg total) by mouth daily. 90 capsule 0 07/23/2022   albuterol (VENTOLIN HFA) 108 (90 BASE) MCG/ACT inhaler Inhale 1-2 puffs into the lungs every 6 (six) hours as needed for wheezing or shortness of breath. 1 Inhaler 0 More than a month   clobetasol (OLUX) 0.05 % topical foam Apply topically 2 (two) times daily. For up to 2 weeks as needed. Avoid applying to face, groin, and axilla. Use as directed. Long-term use can cause thinning of the skin. (Patient taking differently: Apply topically as needed.  as needed. Avoid applying to face, groin, and axilla. Use as directed. Long-term use can cause thinning of the skin.)  50 g 2 More than a month   fluticasone (FLONASE) 50 MCG/ACT nasal spray Place 2 sprays into both nostrils daily as needed for allergies.   More than a  month   nitroGLYCERIN (NITROSTAT) 0.4 MG SL tablet Place 1 tablet (0.4 mg total) under the tongue every 5 (five) minutes as needed for chest pain. No more than 3 doses in a day 25 tablet 3 Unknown   tacrolimus (PROTOPIC) 0.1 % ointment Apply topically 2 (two) times daily. (Patient not taking: Reported on 07/19/2022) 100 g 1 Unknown   triamcinolone ointment (KENALOG) 0.1 % apply twice daily as needed to affected areas for two weeks then stop. Avoid applying to face, groin, and axilla. Use as directed. Long-term use can cause thinning of the skin. 60 g 0 More than a month    Family History  Problem Relation Age of Onset   COPD Mother    Heart attack Father    Stroke Father    Diabetes Sister    Cancer Maternal Aunt        breast & uterine   Breast cancer Maternal Aunt 60   Heart Problems Maternal Grandmother      Review of Systems:   Review of Systems  Eyes:  Positive for blurred vision and discharge.       Bilat cateracts  Respiratory:  Positive for cough and shortness of breath.   Cardiovascular:  Positive for chest pain and leg swelling.  Gastrointestinal:  Positive for constipation and heartburn.  Genitourinary:  Positive for flank pain.  Skin:  Positive for rash.       Recent rash tx'd by derm , currently resolved. Was on legs.   Neurological:  Positive for headaches.        H/o temporal headaches- resolved with effexor dose increase  Endo/Heme/Allergies:  Bruises/bleeds easily.  Psychiatric/Behavioral:  Positive for depression. The patient does not have insomnia.        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 Resp: Fair air exchange throughout, unable to use sit up but she is at bedrest after catheter Back: Nontender to palpation Cardio: regular rate and rhythm, S1, S2 normal, no murmur, click, rub or gallop GI: soft, non-tender; bowel sounds normal; no masses,  no organomegaly Extremities: extremities normal, atraumatic, no cyanosis or edema and palpable DP bilaterally, absent PT pulses some varicosity findings on right lower extremity Neurologic: Grossly normal  Diagnostic Studies & Laboratory data:     Recent Radiology Findings:   CARDIAC CATHETERIZATION  Result Date: 07/23/2022 Conclusions: Severe three-vessel coronary artery disease including sequential 70-90% proximal and mid LAD stenoses with aneurysmal segment at the takeoff of large first diagonal branch, 70-80% proximal/mid LCx stenosis, and chronic total occlusion of mid RCA with left-to-right collaterals. Mildly elevated left ventricular filling pressure (LVEDP 22 mmHg).  LVEF known to be normal based on recent echocardiogram at [facility]. Recommendations: Admit for antianginal therapy, as progressive atypical left-sided chest pain is most likely the patient's anginal equivalent, as well as cardiac surgery consultation for CABG. Aggressive secondary prevention of coronary artery disease. Yvonne Kendall, MD  [facility]    I have independently reviewed the above radiologic studies and discussed with the patient   Recent Lab Findings: Lab Results  Component Value Date   WBC 6.4 07/19/2022   HGB 13.3 07/19/2022   HCT 42.5 07/19/2022   PLT 185 07/19/2022   GLUCOSE 96 07/19/2022   CHOL 236 (H)  10/20/2014   TRIG 209.0 (H) 10/20/2014   HDL 36.80 (L) 10/20/2014   LDLDIRECT 161.0 10/20/2014   LDLCALC 106 (H) 04/29/2012   ALT 20 10/20/2014   AST 16 10/20/2014   NA 139 07/19/2022   K 3.7 07/19/2022   CL 103 07/19/2022   CREATININE 0.68 07/19/2022   BUN 12 07/19/2022   CO2 28 07/19/2022   TSH 2.72 10/20/2014      Assessment / Plan: Severe three-vessel coronary artery disease History of tobacco abuse, probable COPD Hypertension History of thoracicoabdominal aneurysm with plans to repair at [facility] Upper lipidemia Hypothyroidism on Synthroid replacement  Plan: The patient and all relevant studies were reviewed by this surgeon to determine timing and further plan surgical intervention  I  spent 40 minutes counseling the patient face to face.   Rowe Clack, PA-C  07/23/2022 11:52 AM   Chart, cath and echo images reviewed, patient examined, agree with above. This 67 year old woman has an enlarging descending thoracoabdominal aortic aneurysm measuring 5.7 cm and was scheduled for endovascular repair at [facility] in a couple weeks. She underwent preop cardiac workup due to exertional substernal and left sided chest pain resulting in cath yesterday showing severe 3 vessel CAD that is most amenable to CABG. Echo shows normal LVEF with no significant valvular abnormality. I agree with need for CABG for relief of her symptoms and to decrease risk of ischemia and infarction.  This should be done prior to her aneurysm repair. I discussed the operative procedure with the patient and her daughter including alternatives, benefits and risks; including but not limited to bleeding, blood transfusion, infection, stroke, myocardial infarction, graft failure, heart block requiring a permanent pacemaker, organ dysfunction, and death.  [name] understands and agrees to proceed.  We will schedule surgery Friday am.

## 2022-07-24 ENCOUNTER — Ambulatory Visit: Payer: Medicare HMO | Admitting: Podiatry

## 2022-07-24 DIAGNOSIS — I25119 Atherosclerotic heart disease of native coronary artery with unspecified angina pectoris: Secondary | ICD-10-CM | POA: Diagnosis not present

## 2022-07-24 NOTE — Progress Notes (Signed)
  Transition of Care [facility]) Screening Note   Patient Details  Name: Kaylee Pope Date of Birth: 1955/07/29   Transition of Care [facility]) CM/SW Contact:    Harriet Masson, RN Phone Number: 07/24/2022, 9:06 AM    Transition of Care Department [facility]) has reviewed patient and no TOC needs have been identified at this time. We will continue to monitor patient advancement through interdisciplinary progression rounds. If new patient transition needs arise, please place a TOC consult.

## 2022-07-24 NOTE — Plan of Care (Signed)
  Problem: Education: Goal: Understanding of CV disease, CV risk reduction, and recovery process will improve Outcome: Progressing Goal: Individualized Educational Video(s) Outcome: Progressing   Problem: Activity: Goal: Ability to return to baseline activity level will improve Outcome: Progressing   Problem: Cardiovascular: Goal: Ability to achieve and maintain adequate cardiovascular perfusion will improve Outcome: Progressing Goal: Vascular access site(s) Level 0-1 will be maintained Outcome: Progressing   Problem: Education: Goal: Knowledge of General Education information will improve Description: Including pain rating scale, medication(s)/side effects and non-pharmacologic comfort measures Outcome: Progressing   Problem: Health Behavior/Discharge Planning: Goal: Ability to manage health-related needs will improve Outcome: Progressing   Problem: Clinical Measurements: Goal: Ability to maintain clinical measurements within normal limits will improve Outcome: Progressing Goal: Will remain free from infection Outcome: Progressing Goal: Diagnostic test results will improve Outcome: Progressing Goal: Respiratory complications will improve Outcome: Progressing Goal: Cardiovascular complication will be avoided Outcome: Progressing   Problem: Activity: Goal: Risk for activity intolerance will decrease Outcome: Progressing   Problem: Nutrition: Goal: Adequate nutrition will be maintained Outcome: Progressing   Problem: Coping: Goal: Level of anxiety will decrease Outcome: Progressing   Problem: Elimination: Goal: Will not experience complications related to bowel motility Outcome: Progressing Goal: Will not experience complications related to urinary retention Outcome: Progressing   Problem: Pain Managment: Goal: General experience of comfort will improve Outcome: Progressing   Problem: Safety: Goal: Ability to remain free from injury will improve Outcome:  Progressing   Problem: Skin Integrity: Goal: Risk for impaired skin integrity will decrease Outcome: Progressing

## 2022-07-24 NOTE — Progress Notes (Signed)
Pt encouraged to move throughout the week and practice functional exercises in preparation for surgery barring angina and unusual symptoms.   Pt received OHS book, Move in the Tube sheet, OHS careguide. Pt was educated on approx length of surgery and stay, importance of ambulation and using IS, restrictions, home needs, and CRPII.   Pt will be referred to [facility] after surgery.

## 2022-07-24 NOTE — Progress Notes (Addendum)
Rounding Note    Patient Name: Kaylee Pope Date of Encounter: 07/24/2022  [facility] Cardiologist: Yvonne Kendall, MD   Subjective   Feeling well this morning. No chest pain. CR at the bedside.   Inpatient Medications    Scheduled Meds:  amLODipine  10 mg Oral Daily   aspirin EC  81 mg Oral Daily   carvedilol  6.25 mg Oral BID WC   enoxaparin (LOVENOX) injection  40 mg Subcutaneous Q24H   isosorbide mononitrate  15 mg Oral Daily   levothyroxine  75 mcg Oral Q0600   nicotine  21 mg Transdermal Q0600   rosuvastatin  40 mg Oral Daily   sodium chloride flush  3 mL Intravenous Q12H   varenicline  0.5 mg Oral Daily   venlafaxine XR  150 mg Oral Daily   Continuous Infusions:  sodium chloride     PRN Meds: sodium chloride, acetaminophen, albuterol, fluticasone, nitroGLYCERIN, ondansetron (ZOFRAN) IV, sodium chloride flush   Vital Signs    Vitals:   07/23/22 2344 07/24/22 0316 07/24/22 0735 07/24/22 0948  BP: (!) 148/84 (!) 150/85 120/84 (!) 143/91  Pulse: 80 83 78   Resp: (!) 24 20 20    Temp: 98 F (36.7 C) 98.8 F (37.1 C) 98.4 F (36.9 C)   TempSrc: Oral Oral Oral   SpO2: 90% 90% 92%   Weight:      Height:        Intake/Output Summary (Last 24 hours) at 07/24/2022 1053 Last data filed at 07/24/2022 0737 Gross per 24 hour  Intake 360 ml  Output --  Net 360 ml      07/23/2022    9:08 AM 07/19/2022   10:21 AM 06/26/2022    9:09 AM  Last 3 Weights  Weight (lbs) 165 lb 165 lb 6 oz 166 lb  Weight (kg) 74.844 kg 75.014 kg 75.297 kg      Telemetry    Sinus Rhythm - Personally Reviewed  ECG    No new tracing  Physical Exam   GEN: No acute distress.   Neck: No JVD Cardiac: RRR, no murmurs, rubs, or gallops.  Respiratory: Clear to auscultation bilaterally. GI: Soft, nontender, non-distended  MS: No edema; No deformity. Right radial cath site stable.  Neuro:  Nonfocal  Psych: Normal affect   Labs    High Sensitivity Troponin:  No  results for input(s): "TROPONINIHS" in the last 720 hours.   Chemistry Recent Labs  Lab 07/19/22 1152 07/23/22 1904  NA 139  --   K 3.7  --   CL 103  --   CO2 28  --   GLUCOSE 96  --   BUN 12  --   CREATININE 0.68 0.62  CALCIUM 9.2  --   GFRNONAA >60 >60  ANIONGAP 8  --     Lipids No results for input(s): "CHOL", "TRIG", "HDL", "LABVLDL", "LDLCALC", "CHOLHDL" in the last 168 hours.  Hematology Recent Labs  Lab 07/19/22 1152 07/23/22 1904  WBC 6.4 6.6  RBC 4.65 4.47  HGB 13.3 13.0  HCT 42.5 41.2  MCV 91.4 92.2  MCH 28.6 29.1  MCHC 31.3 31.6  RDW 13.8 13.7  PLT 185 212   Thyroid No results for input(s): "TSH", "FREET4" in the last 168 hours.  BNPNo results for input(s): "BNP", "PROBNP" in the last 168 hours.  DDimer No results for input(s): "DDIMER" in the last 168 hours.   Radiology    ECHOCARDIOGRAM COMPLETE  Result Date: 07/23/2022  ECHOCARDIOGRAM REPORT   Patient Name:   Kaylee Pope Date of Exam: 07/23/2022 Medical Rec #:  [id number]       Height:       60.0 in Accession #:    [id number]      Weight:       165.0 lb Date of Birth:  February 26, 1956      BSA:          1.720 m Patient Age:    66 years        BP:           112/90 mmHg Patient Gender: F               HR:           70 bpm. Exam Location:  Inpatient Procedure: 2D Echo, Cardiac Doppler and Color Doppler Indications:    CAD native vessel  History:        Patient has no prior history of Echocardiogram examinations.                 Risk Factors:Hypertension and Dyslipidemia.  Sonographer:    Mike Gip Referring Phys: [id number] BRYAN K BARTLE IMPRESSIONS  1. Left ventricular ejection fraction, by estimation, is 55 to 60%. The left ventricle has normal function. The left ventricle has no regional wall motion abnormalities. There is mild concentric left ventricular hypertrophy. Left ventricular diastolic parameters are indeterminate.  2. Right ventricular systolic function is normal. The right ventricular size is normal.   3. The mitral valve is normal in structure. Trivial mitral valve regurgitation. No evidence of mitral stenosis.  4. The aortic valve is normal in structure. Aortic valve regurgitation is not visualized. No aortic stenosis is present.  5. Abodminal aorta is dilated ( 3.3cm).  6. The inferior vena cava is normal in size with greater than 50% respiratory variability, suggesting right atrial pressure of 3 mmHg.  7. Evidence of atrial level shunting detected by color flow Doppler. Small secundum ASD noted 1.20 cm. Comparison(s): No prior Echocardiogram. FINDINGS  Left Ventricle: Left ventricular ejection fraction, by estimation, is 55 to 60%. The left ventricle has normal function. The left ventricle has no regional wall motion abnormalities. The left ventricular internal cavity size was normal in size. There is  mild concentric left ventricular hypertrophy. Left ventricular diastolic parameters are indeterminate. Right Ventricle: The right ventricular size is normal. No increase in right ventricular wall thickness. Right ventricular systolic function is normal. Left Atrium: Left atrial size was normal in size. Right Atrium: Right atrial size was normal in size. Pericardium: There is no evidence of pericardial effusion. Presence of epicardial fat layer. Mitral Valve: The mitral valve is normal in structure. Trivial mitral valve regurgitation. No evidence of mitral valve stenosis. Tricuspid Valve: The tricuspid valve is normal in structure. Tricuspid valve regurgitation is not demonstrated. No evidence of tricuspid stenosis. Aortic Valve: The aortic valve is normal in structure. Aortic valve regurgitation is not visualized. No aortic stenosis is present. Pulmonic Valve: The pulmonic valve was not well visualized. Pulmonic valve regurgitation is not visualized. No evidence of pulmonic stenosis. Aorta: Abodminal aorta is dilated ( 3.3cm). The aortic root is normal in size and structure. Venous: The inferior vena cava is  normal in size with greater than 50% respiratory variability, suggesting right atrial pressure of 3 mmHg. IAS/Shunts: Evidence of atrial level shunting detected by color flow Doppler. Small secundum ASD noted 1.20 cm.  LEFT VENTRICLE PLAX 2D LVIDd:  5.00 cm      Diastology LVIDs:         2.80 cm      LV e' medial:    6.09 cm/s LV PW:         1.00 cm      LV E/e' medial:  17.6 LV IVS:        1.10 cm      LV e' lateral:   6.74 cm/s LVOT diam:     1.90 cm      LV E/e' lateral: 15.9 LV SV:         64 LV SV Index:   37 LVOT Area:     2.84 cm  LV Volumes (MOD) LV vol d, MOD A2C: 119.0 ml LV vol d, MOD A4C: 111.0 ml LV vol s, MOD A2C: 54.1 ml LV vol s, MOD A4C: 45.3 ml LV SV MOD A2C:     64.9 ml LV SV MOD A4C:     111.0 ml LV SV MOD BP:      65.9 ml RIGHT VENTRICLE             IVC RV Basal diam:  3.10 cm     IVC diam: 1.50 cm RV S prime:     14.30 cm/s TAPSE (M-mode): 2.1 cm LEFT ATRIUM             Index        RIGHT ATRIUM           Index LA diam:        2.40 cm 1.40 cm/m   RA Area:     13.00 cm LA Vol (A2C):   42.2 ml 24.53 ml/m  RA Volume:   25.90 ml  15.06 ml/m LA Vol (A4C):   23.2 ml 13.49 ml/m LA Biplane Vol: 33.3 ml 19.36 ml/m  AORTIC VALVE LVOT Vmax:   103.00 cm/s LVOT Vmean:  61.000 cm/s LVOT VTI:    0.227 m  AORTA Ao Root diam: 2.70 cm Ao Asc diam:  3.30 cm MITRAL VALVE MV Area (PHT): 4.68 cm     SHUNTS MV Decel Time: 162 msec     Systemic VTI:  0.23 m MV E velocity: 107.00 cm/s  Systemic Diam: 1.90 cm MV A velocity: 114.00 cm/s MV E/A ratio:  0.94 Kardie Tobb DO Electronically signed by Thomasene Ripple DO Signature Date/Time: 07/23/2022/3:31:21 PM    Final    CARDIAC CATHETERIZATION  Result Date: 07/23/2022 Conclusions: Severe three-vessel coronary artery disease including sequential 70-90% proximal and mid LAD stenoses with aneurysmal segment at the takeoff of large first diagonal branch, 70-80% proximal/mid LCx stenosis, and chronic total occlusion of mid RCA with left-to-right collaterals.  Mildly elevated left ventricular filling pressure (LVEDP 22 mmHg).  LVEF known to be normal based on recent echocardiogram at [facility]. Recommendations: Admit for antianginal therapy, as progressive atypical left-sided chest pain is most likely the patient's anginal equivalent, as well as cardiac surgery consultation for CABG. Aggressive secondary prevention of coronary artery disease. Yvonne Kendall, MD [facility]   Cardiac Studies   Cath: 07/23/2022  Conclusions: Severe three-vessel coronary artery disease including sequential 70-90% proximal and mid LAD stenoses with aneurysmal segment at the takeoff of large first diagonal branch, 70-80% proximal/mid LCx stenosis, and chronic total occlusion of mid RCA with left-to-right collaterals. Mildly elevated left ventricular filling pressure (LVEDP 22 mmHg).  LVEF known to be normal based on recent echocardiogram at [facility].   Recommendations: Admit for antianginal therapy, as progressive  atypical left-sided chest pain is most likely the patient's anginal equivalent, as well as cardiac surgery consultation for CABG. Aggressive secondary prevention of coronary artery disease.   Yvonne Kendall, MD [facility]  Diagnostic Dominance: Right   Echo: 07/23/2022  IMPRESSIONS     1. Left ventricular ejection fraction, by estimation, is 55 to 60%. The  left ventricle has normal function. The left ventricle has no regional  wall motion abnormalities. There is mild concentric left ventricular  hypertrophy. Left ventricular diastolic  parameters are indeterminate.   2. Right ventricular systolic function is normal. The right ventricular  size is normal.   3. The mitral valve is normal in structure. Trivial mitral valve  regurgitation. No evidence of mitral stenosis.   4. The aortic valve is normal in structure. Aortic valve regurgitation is  not visualized. No aortic stenosis is present.   5. Abodminal aorta is dilated ( 3.3cm).   6. The inferior  vena cava is normal in size with greater than 50%  respiratory variability, suggesting right atrial pressure of 3 mmHg.   7. Evidence of atrial level shunting detected by color flow Doppler.  Small secundum ASD noted 1.20 cm.   Comparison(s): No prior Echocardiogram.   FINDINGS   Left Ventricle: Left ventricular ejection fraction, by estimation, is 55  to 60%. The left ventricle has normal function. The left ventricle has no  regional wall motion abnormalities. The left ventricular internal cavity  size was normal in size. There is   mild concentric left ventricular hypertrophy. Left ventricular diastolic  parameters are indeterminate.   Right Ventricle: The right ventricular size is normal. No increase in  right ventricular wall thickness. Right ventricular systolic function is  normal.   Left Atrium: Left atrial size was normal in size.   Right Atrium: Right atrial size was normal in size.   Pericardium: There is no evidence of pericardial effusion. Presence of  epicardial fat layer.   Mitral Valve: The mitral valve is normal in structure. Trivial mitral  valve regurgitation. No evidence of mitral valve stenosis.   Tricuspid Valve: The tricuspid valve is normal in structure. Tricuspid  valve regurgitation is not demonstrated. No evidence of tricuspid  stenosis.   Aortic Valve: The aortic valve is normal in structure. Aortic valve  regurgitation is not visualized. No aortic stenosis is present.   Pulmonic Valve: The pulmonic valve was not well visualized. Pulmonic valve  regurgitation is not visualized. No evidence of pulmonic stenosis.   Aorta: Abodminal aorta is dilated ( 3.3cm). The aortic root is normal in  size and structure.   Venous: The inferior vena cava is normal in size with greater than 50%  respiratory variability, suggesting right atrial pressure of 3 mmHg.   IAS/Shunts: Evidence of atrial level shunting detected by color flow  Doppler. Small secundum ASD  noted 1.20 cm.       Patient Profile     67 y.o. female with past medical history of thoracic abdominal aortic aneurysm, hypertension, hyperlipidemia, hypothyroidism who presented as an outpatient for preoperative evaluation to undergo FEVAR with Dr. Pattricia Boss at [facility].  Had an outpatient coronary CT which showed significant three-vessel disease.  She was set up for outpatient cardiac catheterization.  Assessment & Plan    CAD -- Underwent cardiac catheterization on 4/15 with significant three-vessel CAD including sequential 70 to 90% proximal/mid LAD stenosis with aneurysmal segment at first takeoff of large OM, 70 to 80% proximal/mid circumflex stenosis and CTO of mid RCA with  left-to-right collaterals.  She was evaluated by [facility], Dr. Laneta Simmers with plans for CABG Friday morning. -- Continue aspirin, Coreg 6.25 mg twice daily, Crestor 40 mg daily, Imdur 15 mg daily, amlodipine 10 mg daily  Hypertension -- Blood pressures elevated yesterday, improved this morning -- Continue Coreg 6.25 mg twice daily, Imdur 15 mg daily, amlodipine 10 mg daily  Hyperlipidemia -- Crestor was increased to 40 mg daily on admission  Hypothyroidism -- Synthroid 75 mcg daily  For questions or updates, please contact [facility] Please consult [email or web address] for contact info under        Signed, Laverda Page, NP  07/24/2022, 10:53 AM     ATTENDING ATTESTATION:  After conducting a review of all available clinical information with the care team, interviewing the patient, and performing a physical exam, I agree with the findings and plan described in this note.   GEN: No acute distress.   HEENT:  MMM, no JVD, no scleral icterus Cardiac: RRR, no murmurs, rubs, or gallops.  Respiratory: Clear to auscultation bilaterally. GI: Soft, nontender, non-distended  MS: No edema; No deformity. Neuro:  Nonfocal  Vasc:  +2 radial pulses  Patient doing well without angina or dyspnea.  Biggest issues is left  flank discomfort (which may be related to thoracoabdominal aneurysm).  Continue current therapy.  To OR for CABG Friday 4/19.  Alverda Skeans, MD Pager [phone]

## 2022-07-24 NOTE — Progress Notes (Signed)
1923 - Pt HR dropped to 33, with missed beat.  Pt denies palpitation or weakness.  VSS.  Dr. Okey Dupre notified.  No orders given.

## 2022-07-25 ENCOUNTER — Encounter (HOSPITAL_COMMUNITY): Payer: Medicare HMO

## 2022-07-25 DIAGNOSIS — I25119 Atherosclerotic heart disease of native coronary artery with unspecified angina pectoris: Secondary | ICD-10-CM | POA: Diagnosis not present

## 2022-07-25 LAB — LIPOPROTEIN A (LPA): Lipoprotein (a): 307.5 nmol/L — ABNORMAL HIGH (ref <75.0)

## 2022-07-25 NOTE — Progress Notes (Signed)
CARDIAC REHAB PHASE I   PRE:  Rate/Rhythm: 76 SR  BP:  Sitting: 137/80      SaO2: 94 RA  MODE:  Ambulation: 150 ft   POST:  Rate/Rhythm: 68 SR  BP:  Sitting: 133/79      SaO2: 98 RA  Pt ambulated independently in hall, tolerated well with no CP, SOB or dizziness. Returned to bed with call bell in reach. Pt practicing IS and reviewing pre-op education materials. Will continue to follow.   1100-1200    Woodroe Chen, RN BSN 07/25/2022 12:01 PM

## 2022-07-25 NOTE — Plan of Care (Signed)

## 2022-07-25 NOTE — Progress Notes (Addendum)
Rounding Note    Patient Name: AVYNN KLASSEN Date of Encounter: 07/25/2022  [facility] Cardiologist: Yvonne Kendall, MD   Subjective   Feeling well this morning. No chest pain.   Inpatient Medications    Scheduled Meds:  amLODipine  10 mg Oral Daily   aspirin EC  81 mg Oral Daily   carvedilol  6.25 mg Oral BID WC   enoxaparin (LOVENOX) injection  40 mg Subcutaneous Q24H   isosorbide mononitrate  15 mg Oral Daily   levothyroxine  75 mcg Oral Q0600   nicotine  21 mg Transdermal Q0600   rosuvastatin  40 mg Oral Daily   sodium chloride flush  3 mL Intravenous Q12H   varenicline  0.5 mg Oral Daily   venlafaxine XR  150 mg Oral Daily   Continuous Infusions:  sodium chloride     PRN Meds: sodium chloride, acetaminophen, albuterol, fluticasone, nitroGLYCERIN, ondansetron (ZOFRAN) IV, sodium chloride flush   Vital Signs    Vitals:   07/24/22 2300 07/25/22 0504 07/25/22 0748 07/25/22 0854  BP: (!) 149/89 (!) 152/91 136/72 137/80  Pulse: 74 76 66   Resp: Temp: 98.3 F (36.8 C) 98.2 F (36.8 C) 98.3 F (36.8 C)   TempSrc: Oral Oral Oral   SpO2: 91% 93% 92%   Weight:      Height:        Intake/Output Summary (Last 24 hours) at 07/25/2022 1027 Last data filed at 07/25/2022 0700 Gross per 24 hour  Intake 360 ml  Output --  Net 360 ml      07/23/2022    9:08 AM 07/19/2022   10:21 AM 06/26/2022    9:09 AM  Last 3 Weights  Weight (lbs) 165 lb 165 lb 6 oz 166 lb  Weight (kg) 74.844 kg 75.014 kg 75.297 kg      Telemetry    Sinus Rhythm - Personally Reviewed  ECG    No new tracing  Physical Exam   GEN: No acute distress.   Neck: No JVD Cardiac: RRR, no murmurs, rubs, or gallops.  Respiratory: Clear to auscultation bilaterally. GI: Soft, nontender, non-distended  MS: No edema; No deformity. Right radial cath site stable. Neuro:  Nonfocal  Psych: Normal affect   Labs    High Sensitivity Troponin:  No results for input(s):  "TROPONINIHS" in the last 720 hours.   Chemistry Recent Labs  Lab 07/19/22 1152 07/23/22 1904  NA 139  --   K 3.7  --   CL 103  --   CO2 28  --   GLUCOSE 96  --   BUN 12  --   CREATININE 0.68 0.62  CALCIUM 9.2  --   GFRNONAA >60 >60  ANIONGAP 8  --     Lipids No results for input(s): "CHOL", "TRIG", "HDL", "LABVLDL", "LDLCALC", "CHOLHDL" in the last 168 hours.  Hematology Recent Labs  Lab 07/19/22 1152 07/23/22 1904  WBC 6.4 6.6  RBC 4.65 4.47  HGB 13.3 13.0  HCT 42.5 41.2  MCV 91.4 92.2  MCH 28.6 29.1  MCHC 31.3 31.6  RDW 13.8 13.7  PLT 185 212   Thyroid No results for input(s): "TSH", "FREET4" in the last 168 hours.  BNPNo results for input(s): "BNP", "PROBNP" in the last 168 hours.  DDimer No results for input(s): "DDIMER" in the last 168 hours.   Radiology    ECHOCARDIOGRAM COMPLETE  Result Date: 07/23/2022    ECHOCARDIOGRAM REPORT  Patient Name:   BRENDALIZ KUK Date of Exam: 07/23/2022 Medical Rec #:  [id number]       Height:       60.0 in Accession #:    [id number]      Weight:       165.0 lb Date of Birth:  08-08-1955      BSA:          1.720 m Patient Age:    66 years        BP:           112/90 mmHg Patient Gender: F               HR:           70 bpm. Exam Location:  Inpatient Procedure: 2D Echo, Cardiac Doppler and Color Doppler Indications:    CAD native vessel  History:        Patient has no prior history of Echocardiogram examinations.                 Risk Factors:Hypertension and Dyslipidemia.  Sonographer:    Mike Gip Referring Phys: [id number] BRYAN K BARTLE IMPRESSIONS  1. Left ventricular ejection fraction, by estimation, is 55 to 60%. The left ventricle has normal function. The left ventricle has no regional wall motion abnormalities. There is mild concentric left ventricular hypertrophy. Left ventricular diastolic parameters are indeterminate.  2. Right ventricular systolic function is normal. The right ventricular size is normal.  3. The mitral valve is  normal in structure. Trivial mitral valve regurgitation. No evidence of mitral stenosis.  4. The aortic valve is normal in structure. Aortic valve regurgitation is not visualized. No aortic stenosis is present.  5. Abodminal aorta is dilated ( 3.3cm).  6. The inferior vena cava is normal in size with greater than 50% respiratory variability, suggesting right atrial pressure of 3 mmHg.  7. Evidence of atrial level shunting detected by color flow Doppler. Small secundum ASD noted 1.20 cm. Comparison(s): No prior Echocardiogram. FINDINGS  Left Ventricle: Left ventricular ejection fraction, by estimation, is 55 to 60%. The left ventricle has normal function. The left ventricle has no regional wall motion abnormalities. The left ventricular internal cavity size was normal in size. There is  mild concentric left ventricular hypertrophy. Left ventricular diastolic parameters are indeterminate. Right Ventricle: The right ventricular size is normal. No increase in right ventricular wall thickness. Right ventricular systolic function is normal. Left Atrium: Left atrial size was normal in size. Right Atrium: Right atrial size was normal in size. Pericardium: There is no evidence of pericardial effusion. Presence of epicardial fat layer. Mitral Valve: The mitral valve is normal in structure. Trivial mitral valve regurgitation. No evidence of mitral valve stenosis. Tricuspid Valve: The tricuspid valve is normal in structure. Tricuspid valve regurgitation is not demonstrated. No evidence of tricuspid stenosis. Aortic Valve: The aortic valve is normal in structure. Aortic valve regurgitation is not visualized. No aortic stenosis is present. Pulmonic Valve: The pulmonic valve was not well visualized. Pulmonic valve regurgitation is not visualized. No evidence of pulmonic stenosis. Aorta: Abodminal aorta is dilated ( 3.3cm). The aortic root is normal in size and structure. Venous: The inferior vena cava is normal in size with  greater than 50% respiratory variability, suggesting right atrial pressure of 3 mmHg. IAS/Shunts: Evidence of atrial level shunting detected by color flow Doppler. Small secundum ASD noted 1.20 cm.  LEFT VENTRICLE PLAX 2D LVIDd:  5.00 cm      Diastology LVIDs:         2.80 cm      LV e' medial:    6.09 cm/s LV PW:         1.00 cm      LV E/e' medial:  17.6 LV IVS:        1.10 cm      LV e' lateral:   6.74 cm/s LVOT diam:     1.90 cm      LV E/e' lateral: 15.9 LV SV:         64 LV SV Index:   37 LVOT Area:     2.84 cm  LV Volumes (MOD) LV vol d, MOD A2C: 119.0 ml LV vol d, MOD A4C: 111.0 ml LV vol s, MOD A2C: 54.1 ml LV vol s, MOD A4C: 45.3 ml LV SV MOD A2C:     64.9 ml LV SV MOD A4C:     111.0 ml LV SV MOD BP:      65.9 ml RIGHT VENTRICLE             IVC RV Basal diam:  3.10 cm     IVC diam: 1.50 cm RV S prime:     14.30 cm/s TAPSE (M-mode): 2.1 cm LEFT ATRIUM             Index        RIGHT ATRIUM           Index LA diam:        2.40 cm 1.40 cm/m   RA Area:     13.00 cm LA Vol (A2C):   42.2 ml 24.53 ml/m  RA Volume:   25.90 ml  15.06 ml/m LA Vol (A4C):   23.2 ml 13.49 ml/m LA Biplane Vol: 33.3 ml 19.36 ml/m  AORTIC VALVE LVOT Vmax:   103.00 cm/s LVOT Vmean:  61.000 cm/s LVOT VTI:    0.227 m  AORTA Ao Root diam: 2.70 cm Ao Asc diam:  3.30 cm MITRAL VALVE MV Area (PHT): 4.68 cm     SHUNTS MV Decel Time: 162 msec     Systemic VTI:  0.23 m MV E velocity: 107.00 cm/s  Systemic Diam: 1.90 cm MV A velocity: 114.00 cm/s MV E/A ratio:  0.94 Kardie Tobb DO Electronically signed by Thomasene Ripple DO Signature Date/Time: 07/23/2022/3:31:21 PM    Final     Cardiac Studies   Cath: 07/23/2022   Conclusions: Severe three-vessel coronary artery disease including sequential 70-90% proximal and mid LAD stenoses with aneurysmal segment at the takeoff of large first diagonal branch, 70-80% proximal/mid LCx stenosis, and chronic total occlusion of mid RCA with left-to-right collaterals. Mildly elevated left ventricular  filling pressure (LVEDP 22 mmHg).  LVEF known to be normal based on recent echocardiogram at [facility].   Recommendations: Admit for antianginal therapy, as progressive atypical left-sided chest pain is most likely the patient's anginal equivalent, as well as cardiac surgery consultation for CABG. Aggressive secondary prevention of coronary artery disease.   Yvonne Kendall, MD [facility]   Diagnostic Dominance: Right    Echo: 07/23/2022   IMPRESSIONS     1. Left ventricular ejection fraction, by estimation, is 55 to 60%. The  left ventricle has normal function. The left ventricle has no regional  wall motion abnormalities. There is mild concentric left ventricular  hypertrophy. Left ventricular diastolic  parameters are indeterminate.   2. Right ventricular systolic function is normal. The right  ventricular  size is normal.   3. The mitral valve is normal in structure. Trivial mitral valve  regurgitation. No evidence of mitral stenosis.   4. The aortic valve is normal in structure. Aortic valve regurgitation is  not visualized. No aortic stenosis is present.   5. Abodminal aorta is dilated ( 3.3cm).   6. The inferior vena cava is normal in size with greater than 50%  respiratory variability, suggesting right atrial pressure of 3 mmHg.   7. Evidence of atrial level shunting detected by color flow Doppler.  Small secundum ASD noted 1.20 cm.   Comparison(s): No prior Echocardiogram.   FINDINGS   Left Ventricle: Left ventricular ejection fraction, by estimation, is 55  to 60%. The left ventricle has normal function. The left ventricle has no  regional wall motion abnormalities. The left ventricular internal cavity  size was normal in size. There is   mild concentric left ventricular hypertrophy. Left ventricular diastolic  parameters are indeterminate.   Right Ventricle: The right ventricular size is normal. No increase in  right ventricular wall thickness. Right ventricular  systolic function is  normal.   Left Atrium: Left atrial size was normal in size.   Right Atrium: Right atrial size was normal in size.   Pericardium: There is no evidence of pericardial effusion. Presence of  epicardial fat layer.   Mitral Valve: The mitral valve is normal in structure. Trivial mitral  valve regurgitation. No evidence of mitral valve stenosis.   Tricuspid Valve: The tricuspid valve is normal in structure. Tricuspid  valve regurgitation is not demonstrated. No evidence of tricuspid  stenosis.   Aortic Valve: The aortic valve is normal in structure. Aortic valve  regurgitation is not visualized. No aortic stenosis is present.   Pulmonic Valve: The pulmonic valve was not well visualized. Pulmonic valve  regurgitation is not visualized. No evidence of pulmonic stenosis.   Aorta: Abodminal aorta is dilated ( 3.3cm). The aortic root is normal in  size and structure.   Venous: The inferior vena cava is normal in size with greater than 50%  respiratory variability, suggesting right atrial pressure of 3 mmHg.   IAS/Shunts: Evidence of atrial level shunting detected by color flow  Doppler. Small secundum ASD noted 1.20 cm.   Patient Profile     67 y.o. female with past medical history of thoracic abdominal aortic aneurysm, hypertension, hyperlipidemia, hypothyroidism who presented as an outpatient for preoperative evaluation to undergo FEVAR with Dr. Pattricia Boss at [facility].  Had an outpatient coronary CT which showed significant three-vessel disease.  She was set up for outpatient cardiac catheterization.   Assessment & Plan    CAD -- Underwent cardiac catheterization on 4/15 with significant three-vessel CAD including sequential 70 to 90% proximal/mid LAD stenosis with aneurysmal segment at first takeoff of large OM, 70 to 80% proximal/mid circumflex stenosis and CTO of mid RCA with left-to-right collaterals.  She was evaluated by [facility], Dr. Laneta Simmers with plans for CABG Friday  morning. -- Continue aspirin, Coreg 6.25 mg twice daily, Crestor 40 mg daily, Imdur 15 mg daily, amlodipine 10 mg daily   Hypertension -- stable -- Continue Coreg 6.25 mg twice daily, Imdur 15 mg daily, amlodipine 10 mg daily   Hyperlipidemia -- Crestor was increased to 40 mg daily on admission   Hypothyroidism -- Synthroid 75 mcg daily    For questions or updates, please contact [facility] Please consult [email or web address] for contact info under        Signed,  Laverda Page, NP  07/25/2022, 10:27 AM     ATTENDING ATTESTATION:  After conducting a review of all available clinical information with the care team, interviewing the patient, and performing a physical exam, I agree with the findings and plan described in this note.   GEN: No acute distress.   HEENT:  MMM, no JVD, no scleral icterus Cardiac: RRR, no murmurs, rubs, or gallops.  Respiratory: Clear to auscultation bilaterally. GI: Soft, nontender, non-distended  MS: No edema; No deformity. Neuro:  Nonfocal  Vasc:  +2 radial pulses  Patient doing well.  Left flank pain seems to be improved.  She developed a headache with Imdur so we will discontinue this for now.  She is relatively chest pain-free with ambulation around her room.  Blood pressure and heart rate are relatively well-controlled.  Alverda Skeans, MD Pager [phone]

## 2022-07-26 DIAGNOSIS — I25119 Atherosclerotic heart disease of native coronary artery with unspecified angina pectoris: Secondary | ICD-10-CM | POA: Diagnosis not present

## 2022-07-26 LAB — BLOOD GAS, ARTERIAL
Acid-Base Excess: 6.4 mmol/L — ABNORMAL HIGH (ref 0.0–2.0)
Bicarbonate: 31.9 mmol/L — ABNORMAL HIGH (ref 20.0–28.0)
O2 Saturation: 93.2 %
Patient temperature: 36.7
pCO2 arterial: 47 mmHg (ref 32–48)
pH, Arterial: 7.43 (ref 7.35–7.45)
pO2, Arterial: 62 mmHg — ABNORMAL LOW (ref 83–108)

## 2022-07-26 LAB — ABO/RH: ABO/RH(D): A POS

## 2022-07-26 LAB — BASIC METABOLIC PANEL
Anion gap: 7 (ref 5–15)
BUN: 9 mg/dL (ref 8–23)
CO2: 28 mmol/L (ref 22–32)
Calcium: 8.6 mg/dL — ABNORMAL LOW (ref 8.9–10.3)
Chloride: 103 mmol/L (ref 98–111)
Creatinine, Ser: 0.66 mg/dL (ref 0.44–1.00)
GFR, Estimated: >60 mL/min (ref >60)
Glucose, Bld: 92 mg/dL (ref 70–99)
Potassium: 3.4 mmol/L — ABNORMAL LOW (ref 3.5–5.1)
Sodium: 138 mmol/L (ref 135–145)

## 2022-07-26 LAB — SARS CORONAVIRUS 2 BY RT PCR: SARS Coronavirus 2 by RT PCR: NEGATIVE

## 2022-07-26 LAB — TYPE AND SCREEN

## 2022-07-26 MED ORDER — TEMAZEPAM 15 MG PO CAPS
15.0000 mg | ORAL_CAPSULE | Freq: Once | ORAL | Status: AC | PRN
  Administered 2022-07-27: 15 mg via ORAL
  Filled 2022-07-26: qty 1

## 2022-07-26 MED ORDER — TRANEXAMIC ACID (OHS) BOLUS VIA INFUSION
15.0000 mg/kg | INTRAVENOUS | Status: DC
  Filled 2022-07-26: qty 1122

## 2022-07-26 MED ORDER — INSULIN REGULAR(HUMAN) IN NACL 100-0.9 UT/100ML-% IV SOLN
INTRAVENOUS | Status: DC
  Filled 2022-07-26: qty 100

## 2022-07-26 MED ORDER — TRANEXAMIC ACID (OHS) PUMP PRIME SOLUTION
2.0000 mg/kg | INTRAVENOUS | Status: DC
  Filled 2022-07-26: qty 1.5

## 2022-07-26 MED ORDER — POTASSIUM CHLORIDE 2 MEQ/ML IV SOLN
80.0000 meq | INTRAVENOUS | Status: DC
  Filled 2022-07-26: qty 40

## 2022-07-26 MED ORDER — VANCOMYCIN HCL 1250 MG/250ML IV SOLN
1250.0000 mg | INTRAVENOUS | Status: DC
  Filled 2022-07-26: qty 250

## 2022-07-26 MED ORDER — CEFAZOLIN SODIUM-DEXTROSE 2-4 GM/100ML-% IV SOLN
2.0000 g | INTRAVENOUS | Status: DC
  Filled 2022-07-26: qty 100

## 2022-07-26 MED ORDER — DIAZEPAM 2 MG PO TABS
5.0000 mg | ORAL_TABLET | Freq: Once | ORAL | Status: AC
  Administered 2022-07-27: 5 mg via ORAL
  Filled 2022-07-26: qty 3

## 2022-07-26 MED ORDER — PLASMA-LYTE A IV SOLN
INTRAVENOUS | Status: DC
  Filled 2022-07-26: qty 2.5

## 2022-07-26 MED ORDER — TRANEXAMIC ACID 1000 MG/10ML IV SOLN
1.5000 mg/kg/h | INTRAVENOUS | Status: DC
  Filled 2022-07-26: qty 25

## 2022-07-26 MED ORDER — CHLORHEXIDINE GLUCONATE 0.12 % MT SOLN
15.0000 mL | Freq: Once | OROMUCOSAL | Status: AC
  Administered 2022-07-27: 15 mL via OROMUCOSAL
  Filled 2022-07-26: qty 15

## 2022-07-26 MED ORDER — CHLORHEXIDINE GLUCONATE CLOTH 2 % EX PADS
6.0000 | MEDICATED_PAD | Freq: Once | CUTANEOUS | Status: AC
  Administered 2022-07-26: 6 via TOPICAL

## 2022-07-26 MED ORDER — NITROGLYCERIN IN D5W 200-5 MCG/ML-% IV SOLN
2.0000 ug/min | INTRAVENOUS | Status: DC
  Filled 2022-07-26: qty 250

## 2022-07-26 MED ORDER — MILRINONE LACTATE IN DEXTROSE 20-5 MG/100ML-% IV SOLN
0.3000 ug/kg/min | INTRAVENOUS | Status: DC
  Filled 2022-07-26: qty 100

## 2022-07-26 MED ORDER — HEPARIN 30,000 UNITS/1000 ML (OHS) CELLSAVER SOLUTION
Status: DC
  Filled 2022-07-26: qty 1000

## 2022-07-26 MED ORDER — PHENYLEPHRINE HCL-NACL 20-0.9 MG/250ML-% IV SOLN
30.0000 ug/min | INTRAVENOUS | Status: DC
  Filled 2022-07-26: qty 250

## 2022-07-26 MED ORDER — EPINEPHRINE HCL 5 MG/250ML IV SOLN IN NS
0.0000 ug/min | INTRAVENOUS | Status: DC
  Filled 2022-07-26: qty 250

## 2022-07-26 MED ORDER — CHLORHEXIDINE GLUCONATE CLOTH 2 % EX PADS: 6.0000 | MEDICATED_PAD | Freq: Once | CUTANEOUS | Status: AC

## 2022-07-26 MED ORDER — DEXMEDETOMIDINE HCL IN NACL 400 MCG/100ML IV SOLN
0.1000 ug/kg/h | INTRAVENOUS | Status: DC
  Filled 2022-07-26: qty 100

## 2022-07-26 MED ORDER — NOREPINEPHRINE 4 MG/250ML-% IV SOLN
0.0000 ug/min | INTRAVENOUS | Status: DC
  Filled 2022-07-26: qty 250

## 2022-07-26 MED ORDER — BISACODYL 5 MG PO TBEC: 5.0000 mg | DELAYED_RELEASE_TABLET | Freq: Once | ORAL | Status: DC

## 2022-07-26 MED ORDER — MANNITOL 20 % IV SOLN
INTRAVENOUS | Status: DC
  Filled 2022-07-26: qty 13

## 2022-07-26 MED ORDER — METOPROLOL TARTRATE 12.5 MG HALF TABLET
12.5000 mg | ORAL_TABLET | Freq: Once | ORAL | Status: AC
  Administered 2022-07-27: 12.5 mg via ORAL
  Filled 2022-07-26: qty 1

## 2022-07-26 NOTE — Progress Notes (Signed)
Rounding Note    Patient Name: Kaylee Pope Date of Encounter: 07/26/2022  [facility] Cardiologist: Yvonne Kendall, MD   Subjective   No acute events overnight.  Imdur d/c'd yesterday due to HA; denies chest pain.  Inpatient Medications    Scheduled Meds:  amLODipine  10 mg Oral Daily   aspirin EC  81 mg Oral Daily   carvedilol  6.25 mg Oral BID WC   enoxaparin (LOVENOX) injection  40 mg Subcutaneous Q24H   levothyroxine  75 mcg Oral Q0600   nicotine  21 mg Transdermal Q0600   rosuvastatin  40 mg Oral Daily   sodium chloride flush  3 mL Intravenous Q12H   varenicline  0.5 mg Oral Daily   venlafaxine XR  150 mg Oral Daily   Continuous Infusions:  sodium chloride     PRN Meds: sodium chloride, acetaminophen, albuterol, fluticasone, nitroGLYCERIN, ondansetron (ZOFRAN) IV, sodium chloride flush   Vital Signs    Vitals:   07/25/22 2332 07/26/22 0602 07/26/22 0603 07/26/22 0716  BP: 131/88  (!) 160/89 (!) 141/84  Pulse: 60  68 66  Resp: 19  13 (!) 22  Temp: 97.7 F (36.5 C)  97.9 F (36.6 C) 97.9 F (36.6 C)  TempSrc: Oral Oral Oral Oral  SpO2: 96%  (!) 88% 91%  Weight:      Height:        Intake/Output Summary (Last 24 hours) at 07/26/2022 0821 Last data filed at 07/25/2022 1700 Gross per 24 hour  Intake 480 ml  Output --  Net 480 ml      07/23/2022    9:08 AM 07/19/2022   10:21 AM 06/26/2022    9:09 AM  Last 3 Weights  Weight (lbs) 165 lb 165 lb 6 oz 166 lb  Weight (kg) 74.844 kg 75.014 kg 75.297 kg      Telemetry    Sinus Rhythm - Personally Reviewed  ECG    No new tracing  Physical Exam   GEN: No acute distress.   Neck: No JVD Cardiac: RRR, no murmurs, rubs, or gallops.  Respiratory: Clear to auscultation bilaterally. GI: Soft, nontender, non-distended  MS: No edema; No deformity. Right radial cath site stable. Neuro:  Nonfocal  Psych: Normal affect   Labs    High Sensitivity Troponin:  No results for input(s):  "TROPONINIHS" in the last 720 hours.   Chemistry Recent Labs  Lab 07/19/22 1152 07/23/22 1904  NA 139  --   K 3.7  --   CL 103  --   CO2 28  --   GLUCOSE 96  --   BUN 12  --   CREATININE 0.68 0.62  CALCIUM 9.2  --   GFRNONAA >60 >60  ANIONGAP 8  --     Lipids No results for input(s): "CHOL", "TRIG", "HDL", "LABVLDL", "LDLCALC", "CHOLHDL" in the last 168 hours.  Hematology Recent Labs  Lab 07/19/22 1152 07/23/22 1904  WBC 6.4 6.6  RBC 4.65 4.47  HGB 13.3 13.0  HCT 42.5 41.2  MCV 91.4 92.2  MCH 28.6 29.1  MCHC 31.3 31.6  RDW 13.8 13.7  PLT 185 212   Thyroid No results for input(s): "TSH", "FREET4" in the last 168 hours.  BNPNo results for input(s): "BNP", "PROBNP" in the last 168 hours.  DDimer No results for input(s): "DDIMER" in the last 168 hours.   Radiology    No results found.  Cardiac Studies   Cath: 07/23/2022   Conclusions: Severe  three-vessel coronary artery disease including sequential 70-90% proximal and mid LAD stenoses with aneurysmal segment at the takeoff of large first diagonal branch, 70-80% proximal/mid LCx stenosis, and chronic total occlusion of mid RCA with left-to-right collaterals. Mildly elevated left ventricular filling pressure (LVEDP 22 mmHg).  LVEF known to be normal based on recent echocardiogram at [facility].   Recommendations: Admit for antianginal therapy, as progressive atypical left-sided chest pain is most likely the patient's anginal equivalent, as well as cardiac surgery consultation for CABG. Aggressive secondary prevention of coronary artery disease.   Yvonne Kendall, MD [facility]   Diagnostic Dominance: Right    Echo: 07/23/2022   IMPRESSIONS     1. Left ventricular ejection fraction, by estimation, is 55 to 60%. The  left ventricle has normal function. The left ventricle has no regional  wall motion abnormalities. There is mild concentric left ventricular  hypertrophy. Left ventricular diastolic  parameters are  indeterminate.   2. Right ventricular systolic function is normal. The right ventricular  size is normal.   3. The mitral valve is normal in structure. Trivial mitral valve  regurgitation. No evidence of mitral stenosis.   4. The aortic valve is normal in structure. Aortic valve regurgitation is  not visualized. No aortic stenosis is present.   5. Abodminal aorta is dilated ( 3.3cm).   6. The inferior vena cava is normal in size with greater than 50%  respiratory variability, suggesting right atrial pressure of 3 mmHg.   7. Evidence of atrial level shunting detected by color flow Doppler.  Small secundum ASD noted 1.20 cm.   Comparison(s): No prior Echocardiogram.   FINDINGS   Left Ventricle: Left ventricular ejection fraction, by estimation, is 55  to 60%. The left ventricle has normal function. The left ventricle has no  regional wall motion abnormalities. The left ventricular internal cavity  size was normal in size. There is   mild concentric left ventricular hypertrophy. Left ventricular diastolic  parameters are indeterminate.   Right Ventricle: The right ventricular size is normal. No increase in  right ventricular wall thickness. Right ventricular systolic function is  normal.   Left Atrium: Left atrial size was normal in size.   Right Atrium: Right atrial size was normal in size.   Pericardium: There is no evidence of pericardial effusion. Presence of  epicardial fat layer.   Mitral Valve: The mitral valve is normal in structure. Trivial mitral  valve regurgitation. No evidence of mitral valve stenosis.   Tricuspid Valve: The tricuspid valve is normal in structure. Tricuspid  valve regurgitation is not demonstrated. No evidence of tricuspid  stenosis.   Aortic Valve: The aortic valve is normal in structure. Aortic valve  regurgitation is not visualized. No aortic stenosis is present.   Pulmonic Valve: The pulmonic valve was not well visualized. Pulmonic valve   regurgitation is not visualized. No evidence of pulmonic stenosis.   Aorta: Abodminal aorta is dilated ( 3.3cm). The aortic root is normal in  size and structure.   Venous: The inferior vena cava is normal in size with greater than 50%  respiratory variability, suggesting right atrial pressure of 3 mmHg.   IAS/Shunts: Evidence of atrial level shunting detected by color flow  Doppler. Small secundum ASD noted 1.20 cm.   Patient Profile     67 y.o. female with past medical history of thoracic abdominal aortic aneurysm, hypertension, hyperlipidemia, hypothyroidism who presented as an outpatient for preoperative evaluation to undergo FEVAR with Dr. Pattricia Boss at [facility].  Had an outpatient coronary CT which showed significant three-vessel disease.  She was set up for outpatient cardiac catheterization.   Assessment & Plan    CAD -- MVD; to OR tomorrow.  Check BMP/CBC today for OR in AM -- Continue aspirin, Coreg 6.25 mg twice daily, Crestor 40 mg daily, amlodipine 10 mg daily   Hypertension -- BP mildly elevated, no changes now prior to OR; continue Coreg 6.25 mg twice daily, Imdur 15 mg daily, amlodipine 10 mg daily   Hyperlipidemia -- Cont Crestor 40   Hypothyroidism -- Synthroid 75 mcg daily    For questions or updates, please contact [facility] Please consult [email or web address] for contact info under

## 2022-07-26 NOTE — Progress Notes (Signed)
CARDIAC REHAB PHASE I   PRE:  Rate/Rhythm: 68 SR  BP:  Sitting: 125/73      SaO2: 96 RA  MODE:  Ambulation: 150 ft   POST:  Rate/Rhythm: 73 SR   BP:  Sitting: 147/81      SaO2: 97 RA   Pt ambulated in hall, tolerating well with no CP, SOB or dizziness. Returned to bed with call bell and bedside table in reach. Pre-op education reviewed. Able to answer several questions. Will continue to follow.   1100-1200  Woodroe Chen, RN BSN 07/26/2022 12:17 PM

## 2022-07-26 NOTE — Care Management Important Message (Signed)
Important Message  Patient Details  Name: Kaylee Pope MRN: [id number] Date of Birth: [birth year]   Medicare Important Message Given:  Yes     Maleea Camilo Stefan Church 07/26/2022, 2:04 PM

## 2022-07-27 ENCOUNTER — Other Ambulatory Visit: Payer: Self-pay

## 2022-07-27 ENCOUNTER — Inpatient Hospital Stay (HOSPITAL_COMMUNITY): Payer: Medicare HMO

## 2022-07-27 ENCOUNTER — Inpatient Hospital Stay (HOSPITAL_COMMUNITY)
Admission: AD | Disposition: A | Discharge status: Home / Self Care | Payer: Self-pay | Source: Ambulatory Visit | Attending: Surgery

## 2022-07-27 ENCOUNTER — Inpatient Hospital Stay (HOSPITAL_COMMUNITY): Payer: Medicare HMO | Admitting: Anesthesiology

## 2022-07-27 DIAGNOSIS — I10 Essential (primary) hypertension: Secondary | ICD-10-CM | POA: Diagnosis not present

## 2022-07-27 DIAGNOSIS — E039 Hypothyroidism, unspecified: Secondary | ICD-10-CM

## 2022-07-27 DIAGNOSIS — I25119 Atherosclerotic heart disease of native coronary artery with unspecified angina pectoris: Secondary | ICD-10-CM

## 2022-07-27 DIAGNOSIS — F17210 Nicotine dependence, cigarettes, uncomplicated: Secondary | ICD-10-CM | POA: Diagnosis not present

## 2022-07-27 DIAGNOSIS — I2510 Atherosclerotic heart disease of native coronary artery without angina pectoris: Secondary | ICD-10-CM

## 2022-07-27 DIAGNOSIS — Z951 Presence of aortocoronary bypass graft: Secondary | ICD-10-CM

## 2022-07-27 HISTORY — PX: CORONARY ARTERY BYPASS GRAFT: SHX141

## 2022-07-27 HISTORY — PX: TEE WITHOUT CARDIOVERSION: SHX5443

## 2022-07-27 LAB — POCT I-STAT 7, (LYTES, BLD GAS, ICA,H+H)
Acid-Base Excess: 0 mmol/L (ref 0.0–2.0)
Acid-Base Excess: 1 mmol/L (ref 0.0–2.0)
Acid-Base Excess: 2 mmol/L (ref 0.0–2.0)
Acid-base deficit: 1 mmol/L (ref 0.0–2.0)
Acid-base deficit: 2 mmol/L (ref 0.0–2.0)
Bicarbonate: 24 mmol/L (ref 20.0–28.0)
Bicarbonate: 25 mmol/L (ref 20.0–28.0)
Bicarbonate: 25.2 mmol/L (ref 20.0–28.0)
Bicarbonate: 26.7 mmol/L (ref 20.0–28.0)
Bicarbonate: 28 mmol/L (ref 20.0–28.0)
Calcium, Ion: 0.97 mmol/L — ABNORMAL LOW (ref 1.15–1.40)
Calcium, Ion: 1.21 mmol/L (ref 1.15–1.40)
Calcium, Ion: 1.22 mmol/L (ref 1.15–1.40)
Calcium, Ion: 1.25 mmol/L (ref 1.15–1.40)
Calcium, Ion: 1.31 mmol/L (ref 1.15–1.40)
HCT: 26 % — ABNORMAL LOW (ref 36.0–46.0)
HCT: 26 % — ABNORMAL LOW (ref 36.0–46.0)
HCT: 29 % — ABNORMAL LOW (ref 36.0–46.0)
HCT: 30 % — ABNORMAL LOW (ref 36.0–46.0)
HCT: 37 % (ref 36.0–46.0)
Hemoglobin: 10.2 g/dL — ABNORMAL LOW (ref 12.0–15.0)
Hemoglobin: 12.6 g/dL (ref 12.0–15.0)
Hemoglobin: 8.8 g/dL — ABNORMAL LOW (ref 12.0–15.0)
Hemoglobin: 8.8 g/dL — ABNORMAL LOW (ref 12.0–15.0)
Hemoglobin: 9.9 g/dL — ABNORMAL LOW (ref 12.0–15.0)
O2 Saturation: 100 %
O2 Saturation: 100 %
O2 Saturation: 100 %
O2 Saturation: 97 %
O2 Saturation: 98 %
Patient temperature: 35.9
Patient temperature: 36.6
Potassium: 3.4 mmol/L — ABNORMAL LOW (ref 3.5–5.1)
Potassium: 3.5 mmol/L (ref 3.5–5.1)
Potassium: 3.7 mmol/L (ref 3.5–5.1)
Potassium: 4.1 mmol/L (ref 3.5–5.1)
Potassium: 4.3 mmol/L (ref 3.5–5.1)
Sodium: 137 mmol/L (ref 135–145)
Sodium: 140 mmol/L (ref 135–145)
Sodium: 141 mmol/L (ref 135–145)
Sodium: 141 mmol/L (ref 135–145)
Sodium: 142 mmol/L (ref 135–145)
TCO2: 25 mmol/L (ref 22–32)
TCO2: 26 mmol/L (ref 22–32)
TCO2: 27 mmol/L (ref 22–32)
TCO2: 28 mmol/L (ref 22–32)
TCO2: 29 mmol/L (ref 22–32)
pCO2 arterial: 38.5 mmHg (ref 32–48)
pCO2 arterial: 43.1 mmHg (ref 32–48)
pCO2 arterial: 48.3 mmHg — ABNORMAL HIGH (ref 32–48)
pCO2 arterial: 53.2 mmHg — ABNORMAL HIGH (ref 32–48)
pCO2 arterial: 54.6 mmHg — ABNORMAL HIGH (ref 32–48)
pH, Arterial: 7.278 — ABNORMAL LOW (ref 7.35–7.45)
pH, Arterial: 7.295 — ABNORMAL LOW (ref 7.35–7.45)
pH, Arterial: 7.355 (ref 7.35–7.45)
pH, Arterial: 7.371 (ref 7.35–7.45)
pH, Arterial: 7.421 (ref 7.35–7.45)
pO2, Arterial: 126 mmHg — ABNORMAL HIGH (ref 83–108)
pO2, Arterial: 331 mmHg — ABNORMAL HIGH (ref 83–108)
pO2, Arterial: 386 mmHg — ABNORMAL HIGH (ref 83–108)
pO2, Arterial: 500 mmHg — ABNORMAL HIGH (ref 83–108)
pO2, Arterial: 98 mmHg (ref 83–108)

## 2022-07-27 LAB — POCT I-STAT, CHEM 8
BUN: 10 mg/dL (ref 8–23)
BUN: 10 mg/dL (ref 8–23)
BUN: 9 mg/dL (ref 8–23)
BUN: 9 mg/dL (ref 8–23)
BUN: 9 mg/dL (ref 8–23)
Calcium, Ion: 1.21 mmol/L (ref 1.15–1.40)
Calcium, Ion: 1.19 mmol/L (ref 1.15–1.40)
Calcium, Ion: 0.97 mmol/L — ABNORMAL LOW (ref 1.15–1.40)
Calcium, Ion: 1.01 mmol/L — ABNORMAL LOW (ref 1.15–1.40)
Calcium, Ion: 1.3 mmol/L (ref 1.15–1.40)
Chloride: 102 mmol/L (ref 98–111)
Chloride: 102 mmol/L (ref 98–111)
Chloride: 101 mmol/L (ref 98–111)
Chloride: 101 mmol/L (ref 98–111)
Chloride: 100 mmol/L (ref 98–111)
Creatinine, Ser: 0.4 mg/dL — ABNORMAL LOW (ref 0.44–1.00)
Creatinine, Ser: 0.6 mg/dL (ref 0.44–1.00)
Creatinine, Ser: 0.4 mg/dL — ABNORMAL LOW (ref 0.44–1.00)
Creatinine, Ser: 0.4 mg/dL — ABNORMAL LOW (ref 0.44–1.00)
Creatinine, Ser: 0.4 mg/dL — ABNORMAL LOW (ref 0.44–1.00)
Glucose, Bld: 101 mg/dL — ABNORMAL HIGH (ref 70–99)
Glucose, Bld: 104 mg/dL — ABNORMAL HIGH (ref 70–99)
Glucose, Bld: 95 mg/dL (ref 70–99)
Glucose, Bld: 145 mg/dL — ABNORMAL HIGH (ref 70–99)
Glucose, Bld: 189 mg/dL — ABNORMAL HIGH (ref 70–99)
HCT: 37 % (ref 36.0–46.0)
HCT: 33 % — ABNORMAL LOW (ref 36.0–46.0)
HCT: 28 % — ABNORMAL LOW (ref 36.0–46.0)
HCT: 27 % — ABNORMAL LOW (ref 36.0–46.0)
HCT: 26 % — ABNORMAL LOW (ref 36.0–46.0)
Hemoglobin: 12.6 g/dL (ref 12.0–15.0)
Hemoglobin: 11.2 g/dL — ABNORMAL LOW (ref 12.0–15.0)
Hemoglobin: 9.5 g/dL — ABNORMAL LOW (ref 12.0–15.0)
Hemoglobin: 9.2 g/dL — ABNORMAL LOW (ref 12.0–15.0)
Hemoglobin: 8.8 g/dL — ABNORMAL LOW (ref 12.0–15.0)
Potassium: 3.4 mmol/L — ABNORMAL LOW (ref 3.5–5.1)
Potassium: 3.6 mmol/L (ref 3.5–5.1)
Potassium: 4.1 mmol/L (ref 3.5–5.1)
Potassium: 4.6 mmol/L (ref 3.5–5.1)
Potassium: 3.7 mmol/L (ref 3.5–5.1)
Sodium: 142 mmol/L (ref 135–145)
Sodium: 141 mmol/L (ref 135–145)
Sodium: 140 mmol/L (ref 135–145)
Sodium: 137 mmol/L (ref 135–145)
Sodium: 136 mmol/L (ref 135–145)
TCO2: 31 mmol/L (ref 22–32)
TCO2: 31 mmol/L (ref 22–32)
TCO2: 28 mmol/L (ref 22–32)
TCO2: 27 mmol/L (ref 22–32)
TCO2: 28 mmol/L (ref 22–32)

## 2022-07-27 LAB — CBC
HCT: 30.5 % — ABNORMAL LOW (ref 36.0–46.0)
HCT: 31.8 % — ABNORMAL LOW (ref 36.0–46.0)
HCT: 39.1 % (ref 36.0–46.0)
Hemoglobin: 12.8 g/dL (ref 12.0–15.0)
Hemoglobin: 9.7 g/dL — ABNORMAL LOW (ref 12.0–15.0)
Hemoglobin: 9.9 g/dL — ABNORMAL LOW (ref 12.0–15.0)
MCH: 28.8 pg (ref 26.0–34.0)
MCH: 29.2 pg (ref 26.0–34.0)
MCH: 29.6 pg (ref 26.0–34.0)
MCHC: 30.5 g/dL (ref 30.0–36.0)
MCHC: 32.5 g/dL (ref 30.0–36.0)
MCHC: 32.7 g/dL (ref 30.0–36.0)
MCV: 89.1 fL (ref 80.0–100.0)
MCV: 91 fL (ref 80.0–100.0)
MCV: 94.4 fL (ref 80.0–100.0)
Platelets: 153 10*3/uL (ref 150–400)
Platelets: 189 10*3/uL (ref 150–400)
Platelets: 195 10*3/uL (ref 150–400)
RBC: 3.35 MIL/uL — ABNORMAL LOW (ref 3.87–5.11)
RBC: 3.37 MIL/uL — ABNORMAL LOW (ref 3.87–5.11)
RBC: 4.39 MIL/uL (ref 3.87–5.11)
RDW: 13.5 % (ref 11.5–15.5)
RDW: 13.7 % (ref 11.5–15.5)
RDW: 13.9 % (ref 11.5–15.5)
WBC: 13.3 10*3/uL — ABNORMAL HIGH (ref 4.0–10.5)
WBC: 14 10*3/uL — ABNORMAL HIGH (ref 4.0–10.5)
WBC: 6.4 10*3/uL (ref 4.0–10.5)
nRBC: 0 % (ref 0.0–0.2)
nRBC: 0 % (ref 0.0–0.2)
nRBC: 0 % (ref 0.0–0.2)

## 2022-07-27 LAB — MAGNESIUM: Magnesium: 3.4 mg/dL — ABNORMAL HIGH (ref 1.7–2.4)

## 2022-07-27 LAB — BASIC METABOLIC PANEL
Anion gap: 6 (ref 5–15)
Anion gap: 7 (ref 5–15)
BUN: 13 mg/dL (ref 8–23)
BUN: 8 mg/dL (ref 8–23)
CO2: 26 mmol/L (ref 22–32)
CO2: 27 mmol/L (ref 22–32)
Calcium: 8 mg/dL — ABNORMAL LOW (ref 8.9–10.3)
Calcium: 8.8 mg/dL — ABNORMAL LOW (ref 8.9–10.3)
Chloride: 105 mmol/L (ref 98–111)
Chloride: 109 mmol/L (ref 98–111)
Creatinine, Ser: 0.68 mg/dL (ref 0.44–1.00)
Creatinine, Ser: 0.73 mg/dL (ref 0.44–1.00)
GFR, Estimated: >60 mL/min (ref >60)
GFR, Estimated: >60 mL/min (ref >60)
Glucose, Bld: 126 mg/dL — ABNORMAL HIGH (ref 70–99)
Glucose, Bld: 98 mg/dL (ref 70–99)
Potassium: 3 mmol/L — ABNORMAL LOW (ref 3.5–5.1)
Potassium: 4.6 mmol/L (ref 3.5–5.1)
Sodium: 139 mmol/L (ref 135–145)
Sodium: 141 mmol/L (ref 135–145)

## 2022-07-27 LAB — TYPE AND SCREEN: Unit division: 0

## 2022-07-27 LAB — GLUCOSE, CAPILLARY
Glucose-Capillary: 126 mg/dL — ABNORMAL HIGH (ref 70–99)
Glucose-Capillary: 114 mg/dL — ABNORMAL HIGH (ref 70–99)
Glucose-Capillary: 130 mg/dL — ABNORMAL HIGH (ref 70–99)
Glucose-Capillary: 127 mg/dL — ABNORMAL HIGH (ref 70–99)
Glucose-Capillary: 130 mg/dL — ABNORMAL HIGH (ref 70–99)
Glucose-Capillary: 165 mg/dL — ABNORMAL HIGH (ref 70–99)
Glucose-Capillary: 123 mg/dL — ABNORMAL HIGH (ref 70–99)
Glucose-Capillary: 125 mg/dL — ABNORMAL HIGH (ref 70–99)
Glucose-Capillary: 120 mg/dL — ABNORMAL HIGH (ref 70–99)

## 2022-07-27 LAB — HEMOGLOBIN A1C
Hgb A1c MFr Bld: 5.9 % — ABNORMAL HIGH (ref 4.8–5.6)
Mean Plasma Glucose: 122.63 mg/dL

## 2022-07-27 LAB — BPAM RBC
Blood Product Expiration Date: 202405092359
Blood Product Expiration Date: 202405092359
ISSUE DATE / TIME: 202404160732
Unit Type and Rh: 6200

## 2022-07-27 LAB — HEMOGLOBIN AND HEMATOCRIT, BLOOD
HCT: 28.1 % — ABNORMAL LOW (ref 36.0–46.0)
Hemoglobin: 9.1 g/dL — ABNORMAL LOW (ref 12.0–15.0)

## 2022-07-27 LAB — PROTIME-INR
INR: 1.4 — ABNORMAL HIGH (ref 0.8–1.2)
Prothrombin Time: 17.4 seconds — ABNORMAL HIGH (ref 11.4–15.2)

## 2022-07-27 LAB — URINALYSIS, ROUTINE W REFLEX MICROSCOPIC
Bacteria, UA: NONE SEEN
Bilirubin Urine: NEGATIVE
Glucose, UA: NEGATIVE mg/dL
Ketones, ur: NEGATIVE mg/dL
Leukocytes,Ua: NEGATIVE
Nitrite: NEGATIVE
Protein, ur: NEGATIVE mg/dL
Specific Gravity, Urine: 1.01 (ref 1.005–1.030)
pH: 7 (ref 5.0–8.0)

## 2022-07-27 LAB — PREPARE RBC (CROSSMATCH)

## 2022-07-27 LAB — APTT
aPTT: 28 seconds (ref 24–36)
aPTT: 29 seconds (ref 24–36)

## 2022-07-27 LAB — SURGICAL PCR SCREEN
MRSA, PCR: NEGATIVE
Staphylococcus aureus: NEGATIVE

## 2022-07-27 LAB — PLATELET COUNT: Platelets: 150 10*3/uL (ref 150–400)

## 2022-07-27 LAB — ECHO INTRAOPERATIVE TEE

## 2022-07-27 SURGERY — CORONARY ARTERY BYPASS GRAFTING (CABG)
Anesthesia: General | Site: Chest

## 2022-07-27 MED ORDER — METOPROLOL TARTRATE 12.5 MG HALF TABLET
12.5000 mg | ORAL_TABLET | Freq: Two times a day (BID) | ORAL | Status: DC
  Administered 2022-07-28 – 2022-07-30 (×5): 12.5 mg via ORAL
  Filled 2022-07-27 (×5): qty 1

## 2022-07-27 MED ORDER — LACTATED RINGERS IV SOLN: INTRAVENOUS | Status: DC

## 2022-07-27 MED ORDER — HEMOSTATIC AGENTS (NO CHARGE) OPTIME
TOPICAL | Status: DC | PRN
  Administered 2022-07-27 (×2): 1 via TOPICAL

## 2022-07-27 MED ORDER — BISACODYL 10 MG RE SUPP: 10.0000 mg | Freq: Every day | RECTAL | Status: DC

## 2022-07-27 MED ORDER — PANTOPRAZOLE SODIUM 40 MG PO TBEC
40.0000 mg | DELAYED_RELEASE_TABLET | Freq: Every day | ORAL | Status: DC
  Administered 2022-07-29 – 2022-08-02 (×5): 40 mg via ORAL
  Filled 2022-07-27 (×5): qty 1

## 2022-07-27 MED ORDER — INSULIN REGULAR(HUMAN) IN NACL 100-0.9 UT/100ML-% IV SOLN
INTRAVENOUS | Status: AC
  Administered 2022-07-27: 1.6 [IU]/h via INTRAVENOUS

## 2022-07-27 MED ORDER — INSULIN REGULAR(HUMAN) IN NACL 100-0.9 UT/100ML-% IV SOLN: INTRAVENOUS | Status: DC

## 2022-07-27 MED ORDER — PHENYLEPHRINE HCL-NACL 20-0.9 MG/250ML-% IV SOLN
INTRAVENOUS | Status: DC | PRN
  Administered 2022-07-27: 10 ug/min via INTRAVENOUS

## 2022-07-27 MED ORDER — TRANEXAMIC ACID (OHS) BOLUS VIA INFUSION
15.0000 mg/kg | INTRAVENOUS | Status: AC
  Administered 2022-07-27: 1122 mg via INTRAVENOUS

## 2022-07-27 MED ORDER — FAMOTIDINE IN NACL 20-0.9 MG/50ML-% IV SOLN
20.0000 mg | Freq: Two times a day (BID) | INTRAVENOUS | Status: AC
  Administered 2022-07-27 (×2): 20 mg via INTRAVENOUS
  Filled 2022-07-27 (×2): qty 50

## 2022-07-27 MED ORDER — VANCOMYCIN HCL IN DEXTROSE 1-5 GM/200ML-% IV SOLN
1000.0000 mg | Freq: Once | INTRAVENOUS | Status: AC
  Administered 2022-07-27: 1000 mg via INTRAVENOUS
  Filled 2022-07-27: qty 200

## 2022-07-27 MED ORDER — ASPIRIN 81 MG PO CHEW: 324.0000 mg | CHEWABLE_TABLET | Freq: Every day | ORAL | Status: DC

## 2022-07-27 MED ORDER — PROTAMINE SULFATE 10 MG/ML IV SOLN
INTRAVENOUS | Status: DC | PRN
  Administered 2022-07-27: 240 mg via INTRAVENOUS
  Administered 2022-07-27: 20 mg via INTRAVENOUS

## 2022-07-27 MED ORDER — LACTATED RINGERS IV SOLN: INTRAVENOUS | Status: DC | PRN

## 2022-07-27 MED ORDER — LACTATED RINGERS IV SOLN: 500.0000 mL | Freq: Once | INTRAVENOUS | Status: DC | PRN

## 2022-07-27 MED ORDER — SODIUM CHLORIDE 0.9% FLUSH: 3.0000 mL | INTRAVENOUS | Status: DC | PRN

## 2022-07-27 MED ORDER — METOPROLOL TARTRATE 25 MG/10 ML ORAL SUSPENSION
12.5000 mg | Freq: Two times a day (BID) | ORAL | Status: DC
  Filled 2022-07-27: qty 5

## 2022-07-27 MED ORDER — NITROGLYCERIN IN D5W 200-5 MCG/ML-% IV SOLN
2.0000 ug/min | INTRAVENOUS | Status: DC
  Filled 2022-07-27: qty 250

## 2022-07-27 MED ORDER — FENTANYL CITRATE (PF) 250 MCG/5ML IJ SOLN
INTRAMUSCULAR | Status: AC
  Filled 2022-07-27: qty 5

## 2022-07-27 MED ORDER — MORPHINE SULFATE (PF) 2 MG/ML IV SOLN
1.0000 mg | INTRAVENOUS | Status: DC | PRN
  Administered 2022-07-27 – 2022-07-28 (×2): 1 mg via INTRAVENOUS
  Filled 2022-07-27 (×3): qty 1

## 2022-07-27 MED ORDER — ACETAMINOPHEN 160 MG/5ML PO SOLN: 650.0000 mg | Freq: Once | ORAL | Status: AC

## 2022-07-27 MED ORDER — CEFAZOLIN SODIUM-DEXTROSE 2-4 GM/100ML-% IV SOLN
2.0000 g | INTRAVENOUS | Status: AC
  Administered 2022-07-27: 2 g via INTRAVENOUS

## 2022-07-27 MED ORDER — ACETAMINOPHEN 650 MG RE SUPP
650.0000 mg | Freq: Once | RECTAL | Status: AC
  Administered 2022-07-27: 650 mg via RECTAL

## 2022-07-27 MED ORDER — BISACODYL 5 MG PO TBEC
10.0000 mg | DELAYED_RELEASE_TABLET | Freq: Every day | ORAL | Status: DC
  Administered 2022-07-28 – 2022-07-30 (×3): 10 mg via ORAL
  Filled 2022-07-27 (×3): qty 2

## 2022-07-27 MED ORDER — POTASSIUM CHLORIDE 10 MEQ/50ML IV SOLN
10.0000 meq | INTRAVENOUS | Status: AC
  Administered 2022-07-27 (×3): 10 meq via INTRAVENOUS

## 2022-07-27 MED ORDER — ROCURONIUM BROMIDE 10 MG/ML (PF) SYRINGE
PREFILLED_SYRINGE | INTRAVENOUS | Status: DC | PRN
  Administered 2022-07-27: 50 mg via INTRAVENOUS
  Administered 2022-07-27: 80 mg via INTRAVENOUS
  Administered 2022-07-27 (×3): 50 mg via INTRAVENOUS
  Administered 2022-07-27: 20 mg via INTRAVENOUS
  Administered 2022-07-27: 50 mg via INTRAVENOUS

## 2022-07-27 MED ORDER — TRANEXAMIC ACID 1000 MG/10ML IV SOLN
INTRAVENOUS | Status: DC | PRN
  Administered 2022-07-27: 1.5 mg/kg/h via INTRAVENOUS

## 2022-07-27 MED ORDER — PLASMA-LYTE A IV SOLN: INTRAVENOUS | Status: DC | PRN

## 2022-07-27 MED ORDER — NOREPINEPHRINE 4 MG/250ML-% IV SOLN: 0.0000 ug/min | INTRAVENOUS | Status: DC

## 2022-07-27 MED ORDER — SODIUM CHLORIDE 0.45 % IV SOLN: INTRAVENOUS | Status: DC | PRN

## 2022-07-27 MED ORDER — HEPARIN SODIUM (PORCINE) 1000 UNIT/ML IJ SOLN
INTRAMUSCULAR | Status: AC
  Filled 2022-07-27: qty 1

## 2022-07-27 MED ORDER — PHENYLEPHRINE HCL-NACL 20-0.9 MG/250ML-% IV SOLN: 30.0000 ug/min | INTRAVENOUS | Status: DC

## 2022-07-27 MED ORDER — SODIUM CHLORIDE 0.9% FLUSH
3.0000 mL | Freq: Two times a day (BID) | INTRAVENOUS | Status: DC
  Administered 2022-07-28 – 2022-07-30 (×5): 3 mL via INTRAVENOUS

## 2022-07-27 MED ORDER — MIDAZOLAM HCL (PF) 10 MG/2ML IJ SOLN
INTRAMUSCULAR | Status: AC
  Filled 2022-07-27: qty 2

## 2022-07-27 MED ORDER — PROTAMINE SULFATE 10 MG/ML IV SOLN
INTRAVENOUS | Status: AC
  Filled 2022-07-27: qty 25

## 2022-07-27 MED ORDER — SODIUM CHLORIDE 0.9 % IV SOLN: INTRAVENOUS | Status: DC | PRN

## 2022-07-27 MED ORDER — TRAMADOL HCL 50 MG PO TABS
50.0000 mg | ORAL_TABLET | ORAL | Status: DC | PRN
  Administered 2022-07-28: 50 mg via ORAL
  Filled 2022-07-27: qty 1

## 2022-07-27 MED ORDER — SODIUM CHLORIDE 0.9 % IV SOLN: 250.0000 mL | INTRAVENOUS | Status: DC

## 2022-07-27 MED ORDER — DEXMEDETOMIDINE HCL IN NACL 400 MCG/100ML IV SOLN: 0.0000 ug/kg/h | INTRAVENOUS | Status: DC

## 2022-07-27 MED ORDER — MIDAZOLAM HCL (PF) 5 MG/ML IJ SOLN
INTRAMUSCULAR | Status: DC | PRN
  Administered 2022-07-27 (×2): 2 mg via INTRAVENOUS

## 2022-07-27 MED ORDER — PLASMA-LYTE A IV SOLN: INTRAVENOUS | Status: DC

## 2022-07-27 MED ORDER — PHENYLEPHRINE 80 MCG/ML (10ML) SYRINGE FOR IV PUSH (FOR BLOOD PRESSURE SUPPORT)
PREFILLED_SYRINGE | INTRAVENOUS | Status: DC | PRN
  Administered 2022-07-27: 80 ug via INTRAVENOUS
  Administered 2022-07-27 (×5): 40 ug via INTRAVENOUS

## 2022-07-27 MED ORDER — PHENYLEPHRINE HCL-NACL 20-0.9 MG/250ML-% IV SOLN
0.0000 ug/min | INTRAVENOUS | Status: DC
  Administered 2022-07-27: 30 ug/min via INTRAVENOUS
  Filled 2022-07-27: qty 250

## 2022-07-27 MED ORDER — MILRINONE LACTATE IN DEXTROSE 20-5 MG/100ML-% IV SOLN: 0.3000 ug/kg/min | INTRAVENOUS | Status: DC

## 2022-07-27 MED ORDER — ALBUMIN HUMAN 5 % IV SOLN
250.0000 mL | INTRAVENOUS | Status: AC | PRN
  Administered 2022-07-27 – 2022-07-28 (×3): 12.5 g via INTRAVENOUS
  Filled 2022-07-27 (×2): qty 250

## 2022-07-27 MED ORDER — ALBUMIN HUMAN 5 % IV SOLN: INTRAVENOUS | Status: DC | PRN

## 2022-07-27 MED ORDER — CHLORHEXIDINE GLUCONATE 0.12 % MT SOLN
15.0000 mL | OROMUCOSAL | Status: AC
  Administered 2022-07-27: 15 mL via OROMUCOSAL
  Filled 2022-07-27: qty 15

## 2022-07-27 MED ORDER — ORAL CARE MOUTH RINSE: 15.0000 mL | OROMUCOSAL | Status: DC | PRN

## 2022-07-27 MED ORDER — TRANEXAMIC ACID (OHS) PUMP PRIME SOLUTION: 2.0000 mg/kg | INTRAVENOUS | Status: DC

## 2022-07-27 MED ORDER — CEFAZOLIN SODIUM-DEXTROSE 2-4 GM/100ML-% IV SOLN
2.0000 g | Freq: Three times a day (TID) | INTRAVENOUS | Status: AC
  Administered 2022-07-27 – 2022-07-29 (×6): 2 g via INTRAVENOUS
  Filled 2022-07-27 (×6): qty 100

## 2022-07-27 MED ORDER — ACETAMINOPHEN 500 MG PO TABS
1000.0000 mg | ORAL_TABLET | Freq: Four times a day (QID) | ORAL | Status: AC
  Administered 2022-07-28 – 2022-08-01 (×13): 1000 mg via ORAL
  Filled 2022-07-27 (×15): qty 2

## 2022-07-27 MED ORDER — CHLORHEXIDINE GLUCONATE CLOTH 2 % EX PADS
6.0000 | MEDICATED_PAD | Freq: Every day | CUTANEOUS | Status: DC
  Administered 2022-07-27 – 2022-07-30 (×5): 6 via TOPICAL

## 2022-07-27 MED ORDER — VANCOMYCIN HCL 1250 MG/250ML IV SOLN
1250.0000 mg | INTRAVENOUS | Status: AC
  Administered 2022-07-27: 1250 mg via INTRAVENOUS

## 2022-07-27 MED ORDER — 0.9 % SODIUM CHLORIDE (POUR BTL) OPTIME
TOPICAL | Status: DC | PRN
  Administered 2022-07-27: 5000 mL

## 2022-07-27 MED ORDER — ARTIFICIAL TEARS OPHTHALMIC OINT
TOPICAL_OINTMENT | OPHTHALMIC | Status: DC | PRN
  Administered 2022-07-27: 1 via OPHTHALMIC

## 2022-07-27 MED ORDER — ONDANSETRON HCL 4 MG/2ML IJ SOLN
4.0000 mg | Freq: Four times a day (QID) | INTRAMUSCULAR | Status: DC | PRN
  Administered 2022-07-27 – 2022-07-30 (×3): 4 mg via INTRAVENOUS
  Filled 2022-07-27 (×3): qty 2

## 2022-07-27 MED ORDER — MANNITOL 20 % IV SOLN: INTRAVENOUS | Status: DC

## 2022-07-27 MED ORDER — ORAL CARE MOUTH RINSE
15.0000 mL | OROMUCOSAL | Status: DC
  Administered 2022-07-27 – 2022-07-28 (×4): 15 mL via OROMUCOSAL

## 2022-07-27 MED ORDER — TRANEXAMIC ACID 1000 MG/10ML IV SOLN: 1.5000 mg/kg/h | INTRAVENOUS | Status: DC

## 2022-07-27 MED ORDER — METOPROLOL TARTRATE 5 MG/5ML IV SOLN: 2.5000 mg | INTRAVENOUS | Status: DC | PRN

## 2022-07-27 MED ORDER — OXYCODONE HCL 5 MG PO TABS
5.0000 mg | ORAL_TABLET | ORAL | Status: DC | PRN
  Administered 2022-07-27: 5 mg via ORAL
  Filled 2022-07-27: qty 1

## 2022-07-27 MED ORDER — DOCUSATE SODIUM 100 MG PO CAPS
200.0000 mg | ORAL_CAPSULE | Freq: Every day | ORAL | Status: DC
  Administered 2022-07-28 – 2022-07-30 (×3): 200 mg via ORAL
  Filled 2022-07-27 (×3): qty 2

## 2022-07-27 MED ORDER — POTASSIUM CHLORIDE 2 MEQ/ML IV SOLN: 80.0000 meq | INTRAVENOUS | Status: DC

## 2022-07-27 MED ORDER — MAGNESIUM SULFATE 4 GM/100ML IV SOLN
4.0000 g | Freq: Once | INTRAVENOUS | Status: AC
  Administered 2022-07-27: 4 g via INTRAVENOUS
  Filled 2022-07-27: qty 100

## 2022-07-27 MED ORDER — PROPOFOL 10 MG/ML IV BOLUS
INTRAVENOUS | Status: DC | PRN
  Administered 2022-07-27: 60 mg via INTRAVENOUS
  Administered 2022-07-27 (×2): 20 mg via INTRAVENOUS

## 2022-07-27 MED ORDER — LEVOTHYROXINE SODIUM 75 MCG PO TABS
75.0000 ug | ORAL_TABLET | Freq: Every day | ORAL | Status: DC
  Administered 2022-07-28 – 2022-08-02 (×6): 75 ug via ORAL
  Filled 2022-07-27 (×6): qty 1

## 2022-07-27 MED ORDER — THROMBIN 20000 UNITS EX SOLR
OROMUCOSAL | Status: DC | PRN
  Administered 2022-07-27 (×4): 4 mL via TOPICAL

## 2022-07-27 MED ORDER — DEXTROSE 50 % IV SOLN: 0.0000 mL | INTRAVENOUS | Status: DC | PRN

## 2022-07-27 MED ORDER — DEXMEDETOMIDINE HCL IN NACL 400 MCG/100ML IV SOLN
0.1000 ug/kg/h | INTRAVENOUS | Status: AC
  Administered 2022-07-27: .4 ug/kg/h via INTRAVENOUS

## 2022-07-27 MED ORDER — MIDAZOLAM HCL 2 MG/2ML IJ SOLN
2.0000 mg | INTRAMUSCULAR | Status: DC | PRN
  Filled 2022-07-27: qty 2

## 2022-07-27 MED ORDER — ACETAMINOPHEN 160 MG/5ML PO SOLN
1000.0000 mg | Freq: Four times a day (QID) | ORAL | Status: AC
  Administered 2022-07-27: 1000 mg
  Filled 2022-07-27: qty 40.6

## 2022-07-27 MED ORDER — HEPARIN 30,000 UNITS/1000 ML (OHS) CELLSAVER SOLUTION: Status: DC

## 2022-07-27 MED ORDER — PROPOFOL 10 MG/ML IV BOLUS
INTRAVENOUS | Status: AC
  Filled 2022-07-27: qty 20

## 2022-07-27 MED ORDER — THROMBIN (RECOMBINANT) 20000 UNITS EX SOLR
CUTANEOUS | Status: AC
  Filled 2022-07-27: qty 20000

## 2022-07-27 MED ORDER — HEPARIN SODIUM (PORCINE) 1000 UNIT/ML IJ SOLN
INTRAMUSCULAR | Status: DC | PRN
  Administered 2022-07-27: 26000 [IU] via INTRAVENOUS

## 2022-07-27 MED ORDER — SODIUM CHLORIDE 0.9 % IV SOLN: INTRAVENOUS | Status: DC

## 2022-07-27 MED ORDER — EPINEPHRINE HCL 5 MG/250ML IV SOLN IN NS: 0.0000 ug/min | INTRAVENOUS | Status: DC

## 2022-07-27 MED ORDER — NITROGLYCERIN IN D5W 200-5 MCG/ML-% IV SOLN: 0.0000 ug/min | INTRAVENOUS | Status: DC

## 2022-07-27 MED ORDER — ASPIRIN 325 MG PO TBEC
325.0000 mg | DELAYED_RELEASE_TABLET | Freq: Every day | ORAL | Status: DC
  Administered 2022-07-28 – 2022-07-30 (×3): 325 mg via ORAL
  Filled 2022-07-27 (×3): qty 1

## 2022-07-27 MED ORDER — ROSUVASTATIN CALCIUM 20 MG PO TABS
40.0000 mg | ORAL_TABLET | Freq: Every day | ORAL | Status: DC
  Administered 2022-07-28 – 2022-08-02 (×6): 40 mg via ORAL
  Filled 2022-07-27 (×6): qty 2

## 2022-07-27 MED ORDER — FENTANYL CITRATE (PF) 250 MCG/5ML IJ SOLN
INTRAMUSCULAR | Status: DC | PRN
  Administered 2022-07-27: 100 ug via INTRAVENOUS
  Administered 2022-07-27 (×2): 50 ug via INTRAVENOUS
  Administered 2022-07-27: 200 ug via INTRAVENOUS
  Administered 2022-07-27 (×3): 250 ug via INTRAVENOUS
  Administered 2022-07-27: 100 ug via INTRAVENOUS

## 2022-07-27 SURGICAL SUPPLY — 113 items
ADH SKN CLS APL DERMABOND .7 (GAUZE/BANDAGES/DRESSINGS) ×4
BAG DECANTER FOR FLEXI CONT (MISCELLANEOUS) ×3 IMPLANT
BLADE CLIPPER SURG (BLADE) ×3 IMPLANT
BLADE NDL 3 SS STRL (BLADE) IMPLANT
BLADE NEEDLE 3 SS STRL (BLADE) ×2 IMPLANT
BLADE STERNUM SYSTEM 6 (BLADE) ×3 IMPLANT
BNDG CMPR 5X4 KNIT ELC UNQ LF (GAUZE/BANDAGES/DRESSINGS) ×4
BNDG CMPR 5X62 HK CLSR LF (GAUZE/BANDAGES/DRESSINGS) ×4
BNDG ELASTIC 4INX 5YD STR LF (GAUZE/BANDAGES/DRESSINGS) IMPLANT
BNDG ELASTIC 4X5.8 VLCR STR LF (GAUZE/BANDAGES/DRESSINGS) ×3 IMPLANT
BNDG ELASTIC 6INX 5YD STR LF (GAUZE/BANDAGES/DRESSINGS) IMPLANT
BNDG ELASTIC 6X5.8 VLCR STR LF (GAUZE/BANDAGES/DRESSINGS) ×3 IMPLANT
BNDG GAUZE DERMACEA FLUFF 4 (GAUZE/BANDAGES/DRESSINGS) ×3 IMPLANT
BNDG GZE DERMACEA 4 6PLY (GAUZE/BANDAGES/DRESSINGS) ×4
CANISTER SUCT 3000ML PPV (MISCELLANEOUS) ×3 IMPLANT
CANNULA ARTERIAL NVNT 3/8 20FR (MISCELLANEOUS) IMPLANT
CANNULA MC2 2 STG 36/46 NON-V (CANNULA) IMPLANT
CANNULA VESSEL 3MM 2 BLNT TIP (CANNULA) IMPLANT
CANNULA VESSEL 3MM BLUNT TIP (CANNULA) IMPLANT
CATH ROBINSON RED A/P 18FR (CATHETERS) ×6 IMPLANT
CATH THORACIC 28FR (CATHETERS) ×3 IMPLANT
CATH THORACIC 36FR (CATHETERS) ×3 IMPLANT
CATH THORACIC 36FR RT ANG (CATHETERS) ×3 IMPLANT
CLIP FOGARTY SPRING 6M (CLIP) IMPLANT
CLIP TI MEDIUM 24 (CLIP) IMPLANT
CLIP TI WIDE RED SMALL 24 (CLIP) IMPLANT
CONTAINER PROTECT SURGISLUSH (MISCELLANEOUS) ×6 IMPLANT
DERMABOND ADVANCED .7 DNX12 (GAUZE/BANDAGES/DRESSINGS) IMPLANT
DRAPE CARDIOVASCULAR INCISE (DRAPES) ×2
DRAPE SRG 135X102X78XABS (DRAPES) ×3 IMPLANT
DRAPE WARM FLUID 44X44 (DRAPES) ×3 IMPLANT
DRSG COVADERM 4X14 (GAUZE/BANDAGES/DRESSINGS) ×3 IMPLANT
ELECT CAUTERY BLADE 6.4 (BLADE) ×3 IMPLANT
ELECT REM PT RETURN 9FT ADLT (ELECTROSURGICAL) ×4
ELECTRODE REM PT RTRN 9FT ADLT (ELECTROSURGICAL) ×6 IMPLANT
FELT TEFLON 1X6 (MISCELLANEOUS) ×3 IMPLANT
GAUZE 4X4 16PLY ~~LOC~~+RFID DBL (SPONGE) IMPLANT
GAUZE SPONGE 4X4 12PLY STRL (GAUZE/BANDAGES/DRESSINGS) ×6 IMPLANT
GLOVE BIO SURGEON STRL SZ 6 (GLOVE) IMPLANT
GLOVE BIO SURGEON STRL SZ 6.5 (GLOVE) IMPLANT
GLOVE BIO SURGEON STRL SZ7 (GLOVE) IMPLANT
GLOVE BIO SURGEON STRL SZ7.5 (GLOVE) IMPLANT
GLOVE BIOGEL PI IND STRL 6 (GLOVE) IMPLANT
GLOVE BIOGEL PI IND STRL 6.5 (GLOVE) IMPLANT
GLOVE BIOGEL PI IND STRL 7.0 (GLOVE) IMPLANT
GLOVE ECLIPSE 7.0 STRL STRAW (GLOVE) ×6 IMPLANT
GLOVE ORTHO TXT STRL SZ7.5 (GLOVE) IMPLANT
GLOVE SURG SS PI 7.5 STRL IVOR (GLOVE) IMPLANT
GOWN STRL REUS W/ TWL LRG LVL3 (GOWN DISPOSABLE) ×12 IMPLANT
GOWN STRL REUS W/ TWL XL LVL3 (GOWN DISPOSABLE) ×3 IMPLANT
GOWN STRL REUS W/TWL LRG LVL3 (GOWN DISPOSABLE) ×14
GOWN STRL REUS W/TWL XL LVL3 (GOWN DISPOSABLE) ×2
HEMOSTAT POWDER SURGIFOAM 1G (HEMOSTASIS) ×9 IMPLANT
HEMOSTAT SURGICEL 2X14 (HEMOSTASIS) ×3 IMPLANT
INSERT FOGARTY 61MM (MISCELLANEOUS) IMPLANT
INSERT FOGARTY XLG (MISCELLANEOUS) IMPLANT
KIT BASIN OR (CUSTOM PROCEDURE TRAY) ×3 IMPLANT
KIT SUCTION CATH 14FR (SUCTIONS) ×3 IMPLANT
KIT TURNOVER KIT B (KITS) ×3 IMPLANT
KIT VASOVIEW HEMOPRO 2 VH 4000 (KITS) ×3 IMPLANT
KNIFE MICRO-UNI 3.5 30 DEG (BLADE) ×3 IMPLANT
NS IRRIG 1000ML POUR BTL (IV SOLUTION) ×15 IMPLANT
PACK E OPEN HEART (SUTURE) ×3 IMPLANT
PACK OPEN HEART (CUSTOM PROCEDURE TRAY) ×3 IMPLANT
PAD ARMBOARD 7.5X6 YLW CONV (MISCELLANEOUS) ×6 IMPLANT
PAD ELECT DEFIB RADIOL ZOLL (MISCELLANEOUS) ×3 IMPLANT
PENCIL BUTTON HOLSTER BLD 10FT (ELECTRODE) ×3 IMPLANT
POSITIONER HEAD DONUT 9IN (MISCELLANEOUS) ×3 IMPLANT
PUNCH AORTIC ROTATE 4.0MM (MISCELLANEOUS) IMPLANT
PUNCH AORTIC ROTATE 4.5MM 8IN (MISCELLANEOUS) ×3 IMPLANT
PUNCH AORTIC ROTATE 5MM 8IN (MISCELLANEOUS) IMPLANT
SET MPS 3-ND DEL (MISCELLANEOUS) IMPLANT
SPONGE INTESTINAL PEANUT (DISPOSABLE) IMPLANT
SPONGE T-LAP 18X18 ~~LOC~~+RFID (SPONGE) IMPLANT
SPONGE T-LAP 4X18 ~~LOC~~+RFID (SPONGE) IMPLANT
SUPPORT HEART JANKE-BARRON (MISCELLANEOUS) ×3 IMPLANT
SUT BONE WAX W31G (SUTURE) ×3 IMPLANT
SUT MNCRL AB 4-0 PS2 18 (SUTURE) IMPLANT
SUT PROLENE 3 0 SH DA (SUTURE) IMPLANT
SUT PROLENE 3 0 SH1 36 (SUTURE) ×3 IMPLANT
SUT PROLENE 4 0 RB 1 (SUTURE)
SUT PROLENE 4 0 SH DA (SUTURE) IMPLANT
SUT PROLENE 4-0 RB1 .5 CRCL 36 (SUTURE) IMPLANT
SUT PROLENE 5 0 C 1 36 (SUTURE) IMPLANT
SUT PROLENE 6 0 C 1 30 (SUTURE) IMPLANT
SUT PROLENE 7 0 BV 1 (SUTURE) IMPLANT
SUT PROLENE 7 0 BV1 MDA (SUTURE) ×3 IMPLANT
SUT PROLENE 8 0 BV175 6 (SUTURE) IMPLANT
SUT SILK  1 MH (SUTURE)
SUT SILK 1 MH (SUTURE) IMPLANT
SUT SILK 2 0 SH (SUTURE) IMPLANT
SUT STEEL 6MS V (SUTURE) IMPLANT
SUT STEEL STERNAL CCS#1 18IN (SUTURE) IMPLANT
SUT STEEL SZ 6 DBL 3X14 BALL (SUTURE) IMPLANT
SUT VIC AB 1 CTX 27 (SUTURE) IMPLANT
SUT VIC AB 1 CTX 36 (SUTURE) ×4
SUT VIC AB 1 CTX36XBRD ANBCTR (SUTURE) ×6 IMPLANT
SUT VIC AB 2-0 CT1 27 (SUTURE) ×2
SUT VIC AB 2-0 CT1 TAPERPNT 27 (SUTURE) IMPLANT
SUT VIC AB 2-0 CTX 27 (SUTURE) IMPLANT
SUT VIC AB 3-0 SH 27 (SUTURE)
SUT VIC AB 3-0 SH 27X BRD (SUTURE) IMPLANT
SUT VIC AB 3-0 X1 27 (SUTURE) IMPLANT
SUT VICRYL 4-0 PS2 18IN ABS (SUTURE) IMPLANT
SYSTEM SAHARA CHEST DRAIN ATS (WOUND CARE) ×3 IMPLANT
TAPE CLOTH SURG 4X10 WHT LF (GAUZE/BANDAGES/DRESSINGS) IMPLANT
TAPE PAPER 2X10 WHT MICROPORE (GAUZE/BANDAGES/DRESSINGS) IMPLANT
TOWEL GREEN STERILE (TOWEL DISPOSABLE) ×3 IMPLANT
TOWEL GREEN STERILE FF (TOWEL DISPOSABLE) ×3 IMPLANT
TRAY FOLEY SLVR 16FR TEMP STAT (SET/KITS/TRAYS/PACK) ×3 IMPLANT
TUBING LAP HI FLOW INSUFFLATIO (TUBING) ×3 IMPLANT
UNDERPAD 30X36 HEAVY ABSORB (UNDERPADS AND DIAPERS) ×3 IMPLANT
WATER STERILE IRR 1000ML POUR (IV SOLUTION) ×6 IMPLANT

## 2022-07-27 NOTE — Brief Op Note (Signed)
07/23/2022 - 07/27/2022  12:00 PM  PATIENT:  [name]  67 y.o. female  PRE-OPERATIVE DIAGNOSIS:  CAD  POST-OPERATIVE DIAGNOSIS:  CAD  PROCEDURE:  Procedure(s): CORONARY ARTERY BYPASS GRAFTING (CABG) X 4 USING LEFT INTERNAL MAMMARY ARTERY AND BILATERAL ENDOSCOPIC HARVESTED GREATER SAPEHNOUS VEIN   LIMA-LAD SVG-D1 SVG-OM SVG-PDA  Vein harvest time: Vein prep time:  TRANSESOPHAGEAL ECHOCARDIOGRAM    SURGEON: Alleen Borne, MD - Primary  ASSISTANTS: Susa Simmonds, MD  PHYSICIAN ASSISTANT: Letia Guidry  ANESTHESIA:   general  EBL:  BLOOD ADMINISTERED:none  DRAINS:  mediastinal and left pleural drains    LOCAL MEDICATIONS USED:  NONE  SPECIMEN:  No Specimen  DISPOSITION OF SPECIMEN:  N/A  COUNTS:  Correct  DICTATION: .Dragon Dictation  PLAN OF CARE: Admit to inpatient   PATIENT DISPOSITION:  PACU - hemodynamically stable.   Delay start of Pharmacological VTE agent (>24hrs) due to surgical blood loss or risk of bleeding: yes

## 2022-07-27 NOTE — Op Note (Signed)
CARDIOVASCULAR SURGERY OPERATIVE NOTE  07/27/2022  Surgeon:  Alleen Borne, MD  First Assistant: Jillyn Hidden PA-C and Shann Medal, MD:   An experienced assistant was required given the complexity of this surgery and the standard of surgical care. The assistant was needed for endoscopic vein harvest, exposure, dissection, suctioning, retraction of delicate tissues and sutures, instrument exchange and for overall help during this procedure.   Preoperative Diagnosis:  Severe multi-vessel coronary artery disease   Postoperative Diagnosis:  Same   Procedure:  Median Sternotomy Extracorporeal circulation 3.   Coronary artery bypass grafting x 4  Left internal mammary artery graft to the LAD SVG to diagonal SVG to OM SVG to AM 4.   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 She underwent preop cardiac workup due to exertional substernal and left sided chest pain resulting in cath yesterday showing severe 3 vessel CAD that is most amenable to CABG. Echo shows normal LVEF with no significant valvular abnormality. I agree with need for CABG for relief of her symptoms and to decrease risk of ischemia and infarction.  This should be done prior to her aneurysm repair. I discussed the operative procedure with the patient and her daughter including alternatives, benefits and risks; including but not limited to bleeding, blood transfusion, infection, stroke, myocardial infarction, graft failure, heart block requiring a permanent pacemaker, organ dysfunction, and death.  Kaylee Pope understands and agrees to proceed.    Preparation:  The patient was seen in the preoperative holding area and the correct patient, correct operation were confirmed with the patient after reviewing the medical record and catheterization. The consent was signed by me. Preoperative antibiotics were given. A pulmonary arterial line and radial arterial line were placed by the anesthesia team. The patient was taken back to the operating room and positioned supine on the operating room table. After being placed under general endotracheal anesthesia by the anesthesia team a foley catheter was placed. The neck, chest, abdomen, and both legs were prepped with betadine soap and solution and draped in the usual sterile manner. A surgical time-out was taken and the correct patient and operative procedure were confirmed with the nursing and anesthesia staff.   Cardiopulmonary Bypass:  A median sternotomy was performed. The pericardium was opened in the midline. Right ventricular function appeared normal. The ascending aorta was of normal size and had no palpable plaque. There were no contraindications to aortic cannulation or cross-clamping. The patient was fully systemically heparinized and the ACT was maintained > 400 sec. The proximal aortic arch was cannulated with a 20 F aortic cannula for arterial inflow. Venous cannulation was performed via the right atrial appendage using a two-staged venous cannula. An antegrade cardioplegia/vent cannula was inserted into the mid-ascending aorta. Aortic occlusion was performed with a single cross-clamp. Systemic cooling to 32 degrees Centigrade and topical cooling of the heart with iced saline were used. Hyperkalemic antegrade cold blood cardioplegia was used to induce diastolic arrest and was then given at about 20 minute intervals throughout the period of arrest to maintain myocardial temperature at or below 10 degrees centigrade. A temperature probe was inserted into the interventricular septum and an  insulating pad was placed in the pericardium.   Left internal mammary artery harvest:  The left side of the sternum was retracted using the Rultract retractor. The left internal mammary artery was harvested as a pedicle graft. All side branches were clipped. It was a medium-sized vessel of good quality with excellent blood flow. It was ligated distally and divided. It was sprayed with topical papaverine solution to prevent vasospasm.   Endoscopic vein harvest:  The right greater saphenous vein was harvested endoscopically through a 2 cm incision medial to the right knee. It was harvested from the upper thigh to below the knee. It was a large-sized vein of fair quality but had multiple varicosities.  The side branches were all ligated with 4-0 silk ties. Therefore a second segment of left greater saphenous vein was harvested endoscopically through a 2 cm incision medial to the left knee. It was harvested from the thigh. It was a large-sized vein of better quality than on the right.  Coronary arteries:  The coronary arteries were examined.  LAD:  large vessel with minimal distal disease. The diagonal was large with no distal disease. LCX:  OM3 was large and had mild distal disease. The other OM branches were smaller with segmental disease RCA:  The acute marginal branch crossed over the acute margin and supplied the inferior wall almost to the apex. The PDA proper was tiny.    Grafts:  LIMA to the LAD: 1.75 mm. It was sewn end to side using 8-0 prolene continuous suture. SVG to diagonal:  1.75 mm. It was sewn end to side using 7-0 prolene continuous suture. SVG to OM3:  1.75 mm. It was sewn end to side using 7-0 prolene continuous suture. SVG to AM:  1.6 mm. It was sewn end to side using 7-0 prolene continuous suture.  The proximal vein graft anastomoses were performed to the mid-ascending aorta using continuous 6-0 prolene suture. Graft markers were placed around the proximal  anastomoses.   Completion:  The patient was rewarmed to 37 degrees Centigrade. The clamp was removed from the LIMA pedicle and there was rapid warming of the septum and return of ventricular fibrillation. The crossclamp was removed with a time of 109 minutes. There was spontaneous return of sinus rhythm. The distal and proximal anastomoses were checked for hemostasis. The position of the grafts was satisfactory. Two temporary epicardial pacing wires were placed on the right atrium and two on the right ventricle. The patient was weaned from CPB without difficulty on no inotropes. CPB time was 130 minutes. Cardiac output was 4 LPM. TEE showed normal LV systolic function.  Heparin was fully reversed with protamine and the aortic and venous cannulas removed. Hemostasis was achieved. Mediastinal and left pleural drainage tubes were placed. The sternum was closed with #6 stainless steel wires. The fascia was closed with continuous # 1 vicryl suture. The subcutaneous tissue was closed with 2-0 vicryl continuous suture. The skin was closed with 3-0 vicryl subcuticular suture. All sponge, needle, and instrument counts were reported correct at the end of the case. Dry sterile dressings were placed over the incisions and around the chest tubes which were connected to pleurevac suction. The patient was then transported to the surgical intensive care unit in stable condition.

## 2022-07-27 NOTE — Discharge Summary (Signed)
Physician Discharge Summary  Patient ID: Kaylee Pope MRN: [id number] DOB/AGE: October 02, 1955 67 y.o.  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 She underwent preop cardiac workup due to exertional substernal and left sided chest pain resulting in cath yesterday showing severe 3 vessel CAD that is most amenable to CABG. Echo shows normal LVEF with no significant valvular abnormality. I agree with need for CABG for relief of her symptoms and to decrease risk of ischemia and infarction.  This should be done prior to her aneurysm repair. I discussed the operative procedure with the patient and her daughter including alternatives, benefits and risks; including but not limited to bleeding, blood transfusion, infection, stroke, myocardial infarction, graft failure, heart block requiring a permanent pacemaker, organ dysfunction, and death.  Kaylee Pope understands and agrees to proceed.  We will schedule surgery Friday am.   Hospital Course: [name] remained stable and free of chest pain while awaiting surgery.  She was taken to the operating room as planned on 07/27/2022 where CABG x 4 was carried out.  The saphenous vein was harvested endoscopically from both thighs.  The left internal mammary artery was grafted to the left anterior descending coronary artery.   Separate saphenous vein grafts were placed to the first diagonal, obtuse marginal, and posterior descending coronary arteries.  Following the procedure, she separated from cardiopulmonary bypass without difficulty on Neo-Synephrine.  She was transferred to the surgical ICU in stable condition.  She was extubated without difficulty using standard post cardiac surgical protocols.  On postop day 1 the Swan and arterial line were removed.  Additionally the chest tubes were removed.  She was started on a course of diuretics for expected postoperative volume overload.  She has an expected acute blood loss anemia which is being monitored clinically.  She was started on iron replacement.  By the 3rd post-op day she was ready to transfer to 4E Progressive Care. She regained independence with mobility and had return of normal bowel and bladder function. She continued to have  oxygen desaturations to the mid 80's while ambulating on RA but quickly recovered to the mid 90's on 1L [place] O2. We arranged for home 02 therapy.  At the time of discharge the sternal incision and lower extremity EVH incision are healing with no sign of complication.  Consults: None  Significant Diagnostic Studies:   CLINICAL DATA:  Provided history: Postop check.  Post CABG.   EXAM: CHEST - 2 VIEW   COMPARISON:  Prior chest radiographs 07/29/2022 and earlier.   FINDINGS: A previously demonstrated right IJ approach introducer sheath is no longer present. Prior median sternotomy/CABG. Unchanged cardiomegaly. Ill-defined opacities within the left lung base, similar to the prior examination of 07/29/2022. Small bilateral pleural effusions, increased from the prior examination. No evidence of pneumothorax.   IMPRESSION: 1. Ill-defined opacities within the left lung base, similar to the prior examination of 07/29/2022. This is favored to reflect postoperative atelectasis. However, correlate clinically to exclude signs/symptoms that  would suggest pneumonia. 2. Small bilateral pleural effusions, increased from the prior examination. 3. Cardiomegaly.     Electronically Signed   By: Jackey Loge D.O.   On: 08/01/2022 08:12  Treatments: Surgery  CARDIOVASCULAR SURGERY OPERATIVE NOTE   07/27/2022   Surgeon:  Alleen Borne, MD   First Assistant: Jillyn Hidden PA-C and Shann Medal, MD:   An experienced assistant was required given the complexity of this surgery and the standard of surgical care. The assistant was needed for endoscopic vein harvest, exposure, dissection, suctioning, retraction of delicate tissues and sutures, instrument exchange and for overall help during this procedure.     Preoperative Diagnosis:  Severe multi-vessel coronary artery disease     Postoperative Diagnosis:  Same     Procedure:   Median Sternotomy Extracorporeal circulation 3.   Coronary artery bypass grafting x 4   Left internal mammary artery graft to the LAD SVG to diagonal SVG to OM SVG to AM 4.   Endoscopic vein harvest from the right and left legs     Anesthesia:  General Endotracheal  Discharge Exam: Blood pressure 121/74, pulse 87, temperature 97.7 F (36.5 C), temperature source Oral, resp. rate 20, height 5' (1.524 m), weight 77.8 kg, SpO2 96 %.  General appearance: alert and cooperative Neurologic: intact Heart: regular rate and rhythm, S1, S2 normal, no murmur Lungs: diminished breath sounds bibasilar Abdomen: soft, non-tender; bowel sounds normal Extremities: edema minimal Wound: chest incision healing well.   Disposition:  Discharged to home in stable condition.  Discharge Instructions     Amb Referral to Cardiac Rehabilitation   Complete by: As directed    Diagnosis: CABG   CABG X ___: 4   After initial evaluation and assessments completed: Virtual Based Care may be provided alone or in conjunction with Phase 2 Cardiac Rehab based on patient barriers.: Yes   Intensive Cardiac  Rehabilitation (ICR) [facility] location only OR Traditional Cardiac Rehabilitation (TCR) *If criteria for ICR are not met will enroll in TCR [facility] only): Yes      Allergies as of 08/02/2022   No Known Allergies      Medication List     STOP taking these medications    ibuprofen 200 MG tablet Commonly known as: ADVIL   nitroGLYCERIN 0.4 MG SL tablet Commonly known as: NITROSTAT   tacrolimus 0.1 % ointment Commonly known as: PROTOPIC       TAKE these medications    acetaminophen 325 MG tablet Commonly known as: Tylenol Take 2 tablets (650 mg total) by mouth every 6 (six) hours as needed for mild pain or moderate pain.   albuterol 108 (90 Base) MCG/ACT inhaler Commonly known as: Ventolin HFA Inhale 1-2 puffs into the lungs every 6 (six) hours as needed for wheezing or shortness of breath.   amLODipine 5 MG tablet Commonly known as: NORVASC Take 1 tablet (5 mg total) by mouth daily. Start taking on: August 03, 2022 What changed:  medication strength how much to take   aspirin EC 325 MG tablet Take 1 tablet (325 mg total) by mouth daily. What changed:  medication strength how much to take additional instructions   carvedilol 6.25 MG tablet Commonly known as: COREG Take 6.25 mg by mouth 2 (two) times daily with a meal.   clobetasol 0.05 % topical foam Commonly known as: OLUX Apply topically 2 (two) times daily. For up to 2 weeks as needed. Avoid applying to face, groin, and axilla. Use as directed. Long-term  use can cause thinning of the skin. What changed:  when to take this reasons to take this additional instructions   [facility] Patient [facility] Family Education Misc 1 each by Does not apply route once for 1 dose.   Fe Fum-Vit C-Vit B12-FA Caps capsule Commonly known as: TRIGELS-F FORTE Take 1 capsule by mouth daily after breakfast.   fluticasone 50 MCG/ACT nasal spray Commonly known as: FLONASE Place 2 sprays into both nostrils daily as needed  for allergies.   furosemide 40 MG tablet Commonly known as: LASIX Take 1 tablet (40 mg total) by mouth daily for 7 days.   levothyroxine 75 MCG tablet Commonly known as: SYNTHROID Take 1 tablet (75 mcg total) by mouth daily.   oxyCODONE-acetaminophen 5-325 MG tablet Commonly known as: Percocet Take 1 tablet by mouth every 6 (six) hours as needed for up to 5 days for severe pain.   potassium chloride SA 20 MEQ tablet Commonly known as: KLOR-CON M Take 1 tablet (20 mEq total) by mouth daily for 7 days.   rosuvastatin 20 MG tablet Commonly known as: CRESTOR Take 1 tablet (20 mg total) by mouth daily.   triamcinolone ointment 0.1 % Commonly known as: KENALOG apply twice daily as needed to affected areas for two weeks then stop. Avoid applying to face, groin, and axilla. Use as directed. Long-term use can cause thinning of the skin.   varenicline 1 MG tablet Commonly known as: CHANTIX Take 0.5 mg by mouth daily.   venlafaxine XR 150 MG 24 hr capsule Commonly known as: EFFEXOR-XR Take 1 capsule (150 mg total) by mouth daily.               Durable Medical Equipment  (From admission, onward)           Start     Ordered   08/02/22 1152  For home use only DME oxygen  Once       Comments: POC- pulse dose setting of 2L  Question Answer Comment  Length of Need 6 Months   Mode or (Route) Nasal cannula   Liters per Minute 2   Frequency Continuous (stationary and portable oxygen unit needed)   Oxygen conserving device No   Oxygen delivery system Gas      08/02/22 1152   08/01/22 0940  For home use only DME 4 wheeled rolling walker with seat  Once       Comments: S/p CABG  Question:  Patient needs a walker to treat with the following condition  Answer:  Weakness generalized   08/01/22 0940            Follow-up Information     [facility]. Go on 08/06/2022.   Specialty: Cardiothoracic Surgery Why: Your appointment for suture  removal is at 10am. Contact information: [address] [place], [address] [place] [address] [phone]        Sondra Barges, PA-C. Go on 08/14/2022.   Specialties: Cardiology, Radiology Why: Your appointment is at 8:25am. Contact information: [address] [place] [address] [phone]         Alleen Borne, MD. Go on 08/29/2022.   Specialty: Cardiothoracic Surgery Why: Your appointment is at 12 noon. Please obtain a chest x-ray 1 hour before the appointment at [facility] located at [address] Contact information: [address] [employer] [address] [place] [address] [phone]  [employer] Follow up.   Why: Home 02 arranged- they will deliver portable 02 to room prior to discharge, you will contact them for home delivery when you get home to arrange home delivery Contact information: [address] [place] [address] [phone]                 The patient has been discharged on:   1.Beta Blocker:  Yes [ x  ]                              No   [   ]                              If No, reason:  2.Ace Inhibitor/ARB: Yes [   ]                                     No  [  x  ]                                     If No, reason: soft BP  3.Statin:   Yes [  x ]                  No  [   ]                  If No, reason:  4.Ecasa:  Yes  [  x ]                  No   [   ]                  If No, reason:  5. ACS on Admission? No  P2Y12 Inhibitor:  Yes  [   ]                                No  [ x ]    Signed: Leary Roca, PA-C 08/02/2022, 11:53 AM

## 2022-07-27 NOTE — Progress Notes (Signed)
[facility] Progress Note: Day of Surgery Procedure(s) (LRB): CORONARY ARTERY BYPASS GRAFTING (CABG) X4 USING LEFT INTERNAL MAMMARY ARTERY AND BILATERAL ENDOSCOPIC HARVESTED GREATER SAPHENOUS VEINS (N/A) TRANSESOPHAGEAL ECHOCARDIOGRAM (N/A)  LOS: 4 days   Stable   Low dose neo  Chest tubes dry  Moving toward extubation     Latest Ref Rng & Units 07/27/2022    3:01 PM 07/27/2022    3:00 PM 07/27/2022    1:21 PM  CBC  WBC 4.0 - 10.5 K/uL 14.0     Hemoglobin 12.0 - 15.0 g/dL 9.9  09.8  8.8   Hematocrit 36.0 - 46.0 % 30.5  30.0  26.0   Platelets 150 - 400 K/uL 153          Latest Ref Rng & Units 07/27/2022    3:00 PM 07/27/2022    1:21 PM 07/27/2022    1:18 PM  CMP  Glucose 70 - 99 mg/dL  119    BUN 8 - 23 mg/dL  9    Creatinine 1.47 - 1.00 mg/dL  8.29    Sodium 562 - 130 mmol/L 141  136  137   Potassium 3.5 - 5.1 mmol/L 3.5  3.7  3.7   Chloride 98 - 111 mmol/L  100      ABG    Component Value Date/Time   PHART 7.278 (L) 07/27/2022 1500   PCO2ART 53.2 (H) 07/27/2022 1500   PO2ART 126 (H) 07/27/2022 1500   HCO3 25.2 07/27/2022 1500   TCO2 27 07/27/2022 1500   ACIDBASEDEF 2.0 07/27/2022 1500   O2SAT 98 07/27/2022 1500    Vent Mode: SIMV;PRVC;PSV FiO2 (%):  [50 %] 50 % Set Rate:  [16 bmp-20 bmp] 20 bmp Vt Set:  [360 mL] 360 mL PEEP:  [5 cmH20] 5 cmH20 Pressure Support:  [10 cmH20] 10 cmH20

## 2022-07-27 NOTE — Transfer of Care (Signed)
Immediate Anesthesia Transfer of Care Note  Patient: Kaylee Pope  Procedure(s) Performed: CORONARY ARTERY BYPASS GRAFTING (CABG) X4 USING LEFT INTERNAL MAMMARY ARTERY AND BILATERAL ENDOSCOPIC HARVESTED GREATER SAPHENOUS VEINS (Chest) TRANSESOPHAGEAL ECHOCARDIOGRAM  Patient Location: SICU  Anesthesia Type:General  Level of Consciousness: Patient remains intubated per anesthesia plan  Airway & Oxygen Therapy: Patient remains intubated per anesthesia plan and Patient placed on Ventilator (see vital sign flow sheet for setting)  Post-op Assessment: Report given to RN and Post -op Vital signs reviewed and stable  Post vital signs: Reviewed and stable  Last Vitals:  Vitals Value Taken Time  BP 111/61   Temp    Pulse 80   Resp 16   SpO2 100     Last Pain:  Vitals:   07/27/22 0518  TempSrc: Oral  PainSc:          Complications: No notable events documented.

## 2022-07-27 NOTE — Anesthesia Procedure Notes (Signed)
Central Venous Catheter Insertion Performed by: Val Eagle, MD, anesthesiologist Start/End4/19/2024 7:20 AM, 07/27/2022 7:30 AM Patient location: Pre-op. Preanesthetic checklist: patient identified, IV checked, risks and benefits discussed, surgical consent, monitors and equipment checked, pre-op evaluation, timeout performed and anesthesia consent Position: supine Lidocaine 1% used for infiltration and patient sedated Hand hygiene performed  and maximum sterile barriers used  Catheter size: 8.5 Fr Sheath introducer Procedure performed using ultrasound guided technique. Ultrasound Notes:anatomy identified, needle tip was noted to be adjacent to the nerve/plexus identified, no ultrasound evidence of intravascular and/or intraneural injection and image(s) printed for medical record Attempts: 1 Following insertion, line sutured, dressing applied and Biopatch. Post procedure assessment: blood return through all ports, free fluid flow and no air  Patient tolerated the procedure well with no immediate complications.

## 2022-07-27 NOTE — Anesthesia Procedure Notes (Signed)
Procedure Name: Intubation Date/Time: 07/27/2022 7:51 AM  Performed by: Rachel Moulds, CRNAPre-anesthesia Checklist: Patient identified, Emergency Drugs available, Suction available, Patient being monitored and Timeout performed Patient Re-evaluated:Patient Re-evaluated prior to induction Oxygen Delivery Method: Circle system utilized Preoxygenation: Pre-oxygenation with 100% oxygen Induction Type: IV induction Ventilation: Mask ventilation without difficulty, Oral airway inserted - appropriate to patient size, Mask ventilation with difficulty and Two handed mask ventilation required Laryngoscope Size: Mac and 3 Grade View: Grade II Tube type: Oral Tube size: 7.5 mm Number of attempts: 1 Airway Equipment and Method: Stylet Placement Confirmation: ETT inserted through vocal cords under direct vision, positive ETCO2, CO2 detector and breath sounds checked- equal and bilateral Secured at: 21 cm Tube secured with: Tape Dental Injury: Teeth and Oropharynx as per pre-operative assessment

## 2022-07-27 NOTE — Hospital Course (Signed)
Referring: No ref. 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 She underwent preop cardiac workup due to exertional substernal and left sided chest pain resulting in cath yesterday showing severe 3 vessel CAD that is most amenable to CABG. Echo shows normal LVEF with no significant valvular abnormality. I agree with need for CABG for relief of her symptoms and to decrease risk of ischemia and infarction.  This should be done prior to her aneurysm repair. I discussed the operative procedure with the patient and her daughter including alternatives, benefits and risks; including but not limited to bleeding, blood transfusion, infection, stroke, myocardial infarction, graft failure, heart block requiring a permanent pacemaker, organ dysfunction, and death.  Kaylee Pope understands and agrees to proceed.  We will schedule surgery Friday am.   Hospital Course: Ledin remained stable and free of chest pain while awaiting surgery.  She was taken to the operating room as planned on 07/27/2022 where CABG x 4 was carried out.  The saphenous vein was harvested endoscopically from both thighs.  The left internal mammary artery was grafted to the left anterior descending coronary artery.  Separate saphenous vein grafts were placed to the first diagonal, obtuse marginal, and posterior descending coronary arteries.  Following the procedure, she separated from cardiopulmonary bypass without difficulty on Neo-Synephrine.  She was transferred to the surgical ICU in stable condition.

## 2022-07-27 NOTE — Anesthesia Preprocedure Evaluation (Signed)
Anesthesia Evaluation  Patient identified by MRN, date of birth, ID band Patient awake    Reviewed: Allergy & Precautions, NPO status , Patient's Chart, lab work & pertinent test results  History of Anesthesia Complications Negative for: history of anesthetic complications  Airway Mallampati: III  TM Distance: >3 FB Neck ROM: Full    Dental  (+) Dental Advisory Given   Pulmonary neg shortness of breath, neg COPD, neg recent URI, Current Smoker and Patient abstained from smoking.   breath sounds clear to auscultation       Cardiovascular hypertension, Pt. on medications and Pt. on home beta blockers + angina  + CAD   Rhythm:Regular     Neuro/Psych  PSYCHIATRIC DISORDERS  Depression    negative neurological ROS     GI/Hepatic negative GI ROS, Neg liver ROS,,,  Endo/Other  Hypothyroidism    Renal/GU negative Renal ROS     Musculoskeletal negative musculoskeletal ROS (+)    Abdominal   Peds  Hematology negative hematology ROS (+)   Anesthesia Other Findings   Reproductive/Obstetrics                             Anesthesia Physical Anesthesia Plan  ASA: 4  Anesthesia Plan: General   Post-op Pain Management:    Induction: Intravenous  PONV Risk Score and Plan: 1 and Ondansetron and Treatment may vary due to age or medical condition  Airway Management Planned: Oral ETT  Additional Equipment: Arterial line, CVP, PA Cath, TEE and Ultrasound Guidance Line Placement  Intra-op Plan:   Post-operative Plan: Post-operative intubation/ventilation  Informed Consent: I have reviewed the patients History and Physical, chart, labs and discussed the procedure including the risks, benefits and alternatives for the proposed anesthesia with the patient or authorized representative who has indicated his/her understanding and acceptance.     Dental advisory given  Plan Discussed with:  CRNA  Anesthesia Plan Comments:        Anesthesia Quick Evaluation

## 2022-07-27 NOTE — Anesthesia Procedure Notes (Signed)
Arterial Line Insertion Start/End4/19/2024 6:40 AM, 07/27/2022 6:45 AM Performed by: Rachel Moulds, CRNA, CRNA  Patient location: Pre-op. Preanesthetic checklist: patient identified, IV checked, site marked, risks and benefits discussed, surgical consent, monitors and equipment checked, pre-op evaluation, timeout performed and anesthesia consent Lidocaine 1% used for infiltration Left, radial was placed Catheter size: 20 G Hand hygiene performed  and maximum sterile barriers used   Attempts: 1 Procedure performed without using ultrasound guided technique. Following insertion, dressing applied and Biopatch. Post procedure assessment: normal  Patient tolerated the procedure well with no immediate complications.

## 2022-07-27 NOTE — Interval H&P Note (Signed)
History and Physical Interval Note:  07/27/2022 7:09 AM  Kaylee Pope  has presented today for surgery, with the diagnosis of CAD.  The various methods of treatment have been discussed with the patient and family. After consideration of risks, benefits and other options for treatment, the patient has consented to  Procedure(s): CORONARY ARTERY BYPASS GRAFTING (CABG) (N/A) TRANSESOPHAGEAL ECHOCARDIOGRAM (N/A) as a surgical intervention.  The patient's history has been reviewed, patient examined, no change in status, stable for surgery.  I have reviewed the patient's chart and labs.  Questions were answered to the patient's satisfaction.     Alleen Borne

## 2022-07-28 ENCOUNTER — Inpatient Hospital Stay (HOSPITAL_COMMUNITY): Payer: Medicare HMO

## 2022-07-28 LAB — POCT I-STAT 7, (LYTES, BLD GAS, ICA,H+H)
Acid-Base Excess: 0 mmol/L (ref 0.0–2.0)
Acid-base deficit: 2 mmol/L (ref 0.0–2.0)
Acid-base deficit: 3 mmol/L — ABNORMAL HIGH (ref 0.0–2.0)
Bicarbonate: 27.2 mmol/L (ref 20.0–28.0)
Bicarbonate: 25 mmol/L (ref 20.0–28.0)
Bicarbonate: 23.7 mmol/L (ref 20.0–28.0)
Calcium, Ion: 1.22 mmol/L (ref 1.15–1.40)
Calcium, Ion: 1.25 mmol/L (ref 1.15–1.40)
Calcium, Ion: 1.27 mmol/L (ref 1.15–1.40)
HCT: 28 % — ABNORMAL LOW (ref 36.0–46.0)
HCT: 28 % — ABNORMAL LOW (ref 36.0–46.0)
HCT: 30 % — ABNORMAL LOW (ref 36.0–46.0)
Hemoglobin: 9.5 g/dL — ABNORMAL LOW (ref 12.0–15.0)
Hemoglobin: 9.5 g/dL — ABNORMAL LOW (ref 12.0–15.0)
Hemoglobin: 10.2 g/dL — ABNORMAL LOW (ref 12.0–15.0)
O2 Saturation: 97 %
O2 Saturation: 98 %
O2 Saturation: 95 %
Patient temperature: 36.9
Patient temperature: 37.1
Patient temperature: 37.1
Potassium: 4.5 mmol/L (ref 3.5–5.1)
Potassium: 4.5 mmol/L (ref 3.5–5.1)
Potassium: 4.5 mmol/L (ref 3.5–5.1)
Sodium: 142 mmol/L (ref 135–145)
Sodium: 141 mmol/L (ref 135–145)
Sodium: 141 mmol/L (ref 135–145)
TCO2: 29 mmol/L (ref 22–32)
TCO2: 27 mmol/L (ref 22–32)
TCO2: 25 mmol/L (ref 22–32)
pCO2 arterial: 56.6 mmHg — ABNORMAL HIGH (ref 32–48)
pCO2 arterial: 50.6 mmHg — ABNORMAL HIGH (ref 32–48)
pCO2 arterial: 52.1 mmHg — ABNORMAL HIGH (ref 32–48)
pH, Arterial: 7.288 — ABNORMAL LOW (ref 7.35–7.45)
pH, Arterial: 7.302 — ABNORMAL LOW (ref 7.35–7.45)
pH, Arterial: 7.267 — ABNORMAL LOW (ref 7.35–7.45)
pO2, Arterial: 97 mmHg (ref 83–108)
pO2, Arterial: 113 mmHg — ABNORMAL HIGH (ref 83–108)
pO2, Arterial: 86 mmHg (ref 83–108)

## 2022-07-28 LAB — CBC
HCT: 29.2 % — ABNORMAL LOW (ref 36.0–46.0)
HCT: 27.1 % — ABNORMAL LOW (ref 36.0–46.0)
Hemoglobin: 9.4 g/dL — ABNORMAL LOW (ref 12.0–15.0)
Hemoglobin: 8.5 g/dL — ABNORMAL LOW (ref 12.0–15.0)
MCH: 29.7 pg (ref 26.0–34.0)
MCH: 29.5 pg (ref 26.0–34.0)
MCHC: 32.2 g/dL (ref 30.0–36.0)
MCHC: 31.4 g/dL (ref 30.0–36.0)
MCV: 92.4 fL (ref 80.0–100.0)
MCV: 94.1 fL (ref 80.0–100.0)
Platelets: 145 10*3/uL — ABNORMAL LOW (ref 150–400)
Platelets: 128 10*3/uL — ABNORMAL LOW (ref 150–400)
RBC: 3.16 MIL/uL — ABNORMAL LOW (ref 3.87–5.11)
RBC: 2.88 MIL/uL — ABNORMAL LOW (ref 3.87–5.11)
RDW: 14.1 % (ref 11.5–15.5)
RDW: 14.1 % (ref 11.5–15.5)
WBC: 7.8 10*3/uL (ref 4.0–10.5)
WBC: 8.3 10*3/uL (ref 4.0–10.5)
nRBC: 0 % (ref 0.0–0.2)
nRBC: 0 % (ref 0.0–0.2)

## 2022-07-28 LAB — BASIC METABOLIC PANEL
Anion gap: 7 (ref 5–15)
Anion gap: 13 (ref 5–15)
BUN: 9 mg/dL (ref 8–23)
BUN: 15 mg/dL (ref 8–23)
CO2: 25 mmol/L (ref 22–32)
CO2: 25 mmol/L (ref 22–32)
Calcium: 8.1 mg/dL — ABNORMAL LOW (ref 8.9–10.3)
Calcium: 7.6 mg/dL — ABNORMAL LOW (ref 8.9–10.3)
Chloride: 105 mmol/L (ref 98–111)
Chloride: 98 mmol/L (ref 98–111)
Creatinine, Ser: 0.74 mg/dL (ref 0.44–1.00)
Creatinine, Ser: 1.34 mg/dL — ABNORMAL HIGH (ref 0.44–1.00)
GFR, Estimated: >60 mL/min (ref >60)
GFR, Estimated: 43 mL/min — ABNORMAL LOW (ref >60)
Glucose, Bld: 119 mg/dL — ABNORMAL HIGH (ref 70–99)
Glucose, Bld: 354 mg/dL — ABNORMAL HIGH (ref 70–99)
Potassium: 4.3 mmol/L (ref 3.5–5.1)
Potassium: 3 mmol/L — ABNORMAL LOW (ref 3.5–5.1)
Sodium: 137 mmol/L (ref 135–145)
Sodium: 136 mmol/L (ref 135–145)

## 2022-07-28 LAB — GLUCOSE, CAPILLARY
Glucose-Capillary: 104 mg/dL — ABNORMAL HIGH (ref 70–99)
Glucose-Capillary: 111 mg/dL — ABNORMAL HIGH (ref 70–99)
Glucose-Capillary: 111 mg/dL — ABNORMAL HIGH (ref 70–99)
Glucose-Capillary: 119 mg/dL — ABNORMAL HIGH (ref 70–99)
Glucose-Capillary: 126 mg/dL — ABNORMAL HIGH (ref 70–99)
Glucose-Capillary: 127 mg/dL — ABNORMAL HIGH (ref 70–99)
Glucose-Capillary: 136 mg/dL — ABNORMAL HIGH (ref 70–99)
Glucose-Capillary: 141 mg/dL — ABNORMAL HIGH (ref 70–99)
Glucose-Capillary: 143 mg/dL — ABNORMAL HIGH (ref 70–99)
Glucose-Capillary: 90 mg/dL (ref 70–99)

## 2022-07-28 LAB — MAGNESIUM
Magnesium: 2.9 mg/dL — ABNORMAL HIGH (ref 1.7–2.4)
Magnesium: 2.5 mg/dL — ABNORMAL HIGH (ref 1.7–2.4)

## 2022-07-28 MED ORDER — POTASSIUM CHLORIDE 10 MEQ/50ML IV SOLN
10.0000 meq | INTRAVENOUS | Status: AC
  Administered 2022-07-28 – 2022-07-29 (×3): 10 meq via INTRAVENOUS
  Filled 2022-07-28 (×3): qty 50

## 2022-07-28 MED ORDER — ENOXAPARIN SODIUM 30 MG/0.3ML IJ SOSY
30.0000 mg | PREFILLED_SYRINGE | Freq: Every day | INTRAMUSCULAR | Status: DC
  Administered 2022-07-28 – 2022-08-01 (×5): 30 mg via SUBCUTANEOUS
  Filled 2022-07-28 (×5): qty 0.3

## 2022-07-28 MED ORDER — INSULIN ASPART 100 UNIT/ML IJ SOLN
0.0000 [IU] | INTRAMUSCULAR | Status: DC
  Administered 2022-07-28 – 2022-07-30 (×6): 2 [IU] via SUBCUTANEOUS

## 2022-07-28 MED ORDER — KETOROLAC TROMETHAMINE 15 MG/ML IJ SOLN
15.0000 mg | Freq: Three times a day (TID) | INTRAMUSCULAR | Status: AC | PRN
  Administered 2022-07-28 – 2022-07-29 (×3): 15 mg via INTRAVENOUS
  Filled 2022-07-28 (×3): qty 1

## 2022-07-28 MED ORDER — FUROSEMIDE 10 MG/ML IJ SOLN
40.0000 mg | Freq: Two times a day (BID) | INTRAMUSCULAR | Status: AC
  Administered 2022-07-28 (×2): 40 mg via INTRAVENOUS
  Filled 2022-07-28 (×2): qty 4

## 2022-07-28 MED ORDER — ORAL CARE MOUTH RINSE: 15.0000 mL | OROMUCOSAL | Status: DC | PRN

## 2022-07-28 MED ORDER — ORAL CARE MOUTH RINSE
15.0000 mL | OROMUCOSAL | Status: DC
  Administered 2022-07-28 – 2022-07-29 (×8): 15 mL via OROMUCOSAL

## 2022-07-28 NOTE — Procedures (Signed)
Extubation Procedure Note  Patient Details:   Name: Kaylee Pope DOB: [birth year] MRN: [id number]   Airway Documentation:  Airway 7.5 mm (Active)  Secured at (cm) 21 cm 07/27/22 2346  Measured From Lips 07/27/22 2346  Secured Location Right 07/27/22 2346  Secured By Pink Tape 07/27/22 2346  Prone position No 07/27/22 2346  Cuff Pressure (cm H2O) Clear OR 27-39 CmH2O 07/27/22 1948  Site Condition Cool;Dry 07/27/22 2346   Vent end date: (not recorded) Vent end time: (not recorded)   Evaluation  O2 sats: stable throughout Complications: No apparent complications Patient did tolerate procedure well. Bilateral Breath Sounds: Clear, Diminished   Yes  Rushie Chestnut [facility] 07/28/2022, 4:40 AM

## 2022-07-28 NOTE — Progress Notes (Signed)
NIF -30 VC 1.8 lts pt extubated to a 4 lt [place]. IS 250 with good effort

## 2022-07-28 NOTE — Progress Notes (Signed)
TCTS Progress Note: 1 Day Post-Op Procedure(s) (LRB): CORONARY ARTERY BYPASS GRAFTING (CABG) X4 USING LEFT INTERNAL MAMMARY ARTERY AND BILATERAL ENDOSCOPIC HARVESTED GREATER SAPHENOUS VEINS (N/A) TRANSESOPHAGEAL ECHOCARDIOGRAM (N/A)  LOS: 5 days    POD 1 CAB  2 albumin yest erday one more this AM  Doing very well Sitting up in no pain Resp comfortable, 3L [place] Sinus 70s Abd soft ntnd LE 1+ edema Ct minimal output   Meds:  ASA: yes DVT ppx: no - I have ordered his today BB: yes  Lasix  - yes in 40 mg BID ordered this AM  A/P N: no issues  Patient asking if she can take tramadol and not narcotics as gets nausea. I told her agree but needs to take something I.e. tylenol or tramadol or narcotic 3 x day or her resp status will decline  Talked to primary nurse as well - patient needs to be on some analgesic 3 x /day. Is already getting tylenol q6. I have ordered toradol x 3  for 3 doses as well   CV no issues. Not on gtts Resp 3L , no issues, better than expected with smoking hx. Xr with mild edema GI: no issues GU:  Intake/Output Summary (Last 24 hours) at 07/28/2022 0921 Last data filed at 07/28/2022 0800 Gross per 24 hour  Intake 5077.52 ml  Output 3138 ml  Net 1939.52 ml  Lasix BID is ordered Heme: hgb 10.2, plt 145. Will trend plts Id periop abx Endo no issues T/l/d  Dc chest tubes Dc swan  DC art line later if stable.  Keep central line Keep pacing wires Keep foley in today as low uop   Overall plan POD 1 progression orders are in Ensure adequate diuresis and pain control as current smoker  Would want ton 24 hours of <4L prior to transfer.        Latest Ref Rng & Units 07/28/2022    6:16 AM 07/28/2022    4:28 AM 07/28/2022    4:09 AM  CBC  WBC 4.0 - 10.5 K/uL   7.8   Hemoglobin 12.0 - 15.0 g/dL 16.1  9.5  9.4   Hematocrit 36.0 - 46.0 % 30.0  28.0  29.2   Platelets 150 - 400 K/uL   145        Latest Ref Rng & Units 07/28/2022    6:16 AM  07/28/2022    4:28 AM 07/28/2022    4:09 AM  CMP  Glucose 70 - 99 mg/dL   096   BUN 8 - 23 mg/dL   9   Creatinine 0.45 - 1.00 mg/dL   4.09   Sodium 811 - 914 mmol/L 141  141  137   Potassium 3.5 - 5.1 mmol/L 4.5  4.5  4.3   Chloride 98 - 111 mmol/L   105   CO2 22 - 32 mmol/L   25   Calcium 8.9 - 10.3 mg/dL   8.1     ABG    Component Value Date/Time   PHART 7.267 (L) 07/28/2022 0616   PCO2ART 52.1 (H) 07/28/2022 0616   PO2ART 86 07/28/2022 0616   HCO3 23.7 07/28/2022 0616   TCO2 25 07/28/2022 0616   ACIDBASEDEF 3.0 (H) 07/28/2022 0616   O2SAT 95 07/28/2022 0616    Vent Mode: SIMV/PC/PS FiO2 (%):  [40 %-50 %] 40 % Set Rate:  [4 bmp-20 bmp] 4 bmp Vt Set:  [360 mL] 360 mL PEEP:  [5 cmH20] 5 cmH20 Pressure Support:  [  10 cmH20] 10 cmH20 Plateau Pressure:  [16 cmH20] 16 cmH20

## 2022-07-29 ENCOUNTER — Inpatient Hospital Stay (HOSPITAL_COMMUNITY): Payer: Medicare HMO

## 2022-07-29 LAB — BASIC METABOLIC PANEL
Anion gap: 8 (ref 5–15)
Anion gap: 11 (ref 5–15)
BUN: 17 mg/dL (ref 8–23)
BUN: 15 mg/dL (ref 8–23)
CO2: 27 mmol/L (ref 22–32)
CO2: 26 mmol/L (ref 22–32)
Calcium: 8.1 mg/dL — ABNORMAL LOW (ref 8.9–10.3)
Calcium: 8.2 mg/dL — ABNORMAL LOW (ref 8.9–10.3)
Chloride: 102 mmol/L (ref 98–111)
Chloride: 103 mmol/L (ref 98–111)
Creatinine, Ser: 0.91 mg/dL (ref 0.44–1.00)
Creatinine, Ser: 0.91 mg/dL (ref 0.44–1.00)
GFR, Estimated: >60 mL/min (ref >60)
GFR, Estimated: >60 mL/min (ref >60)
Glucose, Bld: 100 mg/dL — ABNORMAL HIGH (ref 70–99)
Glucose, Bld: 109 mg/dL — ABNORMAL HIGH (ref 70–99)
Potassium: 5.5 mmol/L — ABNORMAL HIGH (ref 3.5–5.1)
Potassium: 3.8 mmol/L (ref 3.5–5.1)
Sodium: 137 mmol/L (ref 135–145)
Sodium: 140 mmol/L (ref 135–145)

## 2022-07-29 LAB — CBC
HCT: 25.9 % — ABNORMAL LOW (ref 36.0–46.0)
Hemoglobin: 8.3 g/dL — ABNORMAL LOW (ref 12.0–15.0)
MCH: 30.2 pg (ref 26.0–34.0)
MCHC: 32 g/dL (ref 30.0–36.0)
MCV: 94.2 fL (ref 80.0–100.0)
Platelets: 118 10*3/uL — ABNORMAL LOW (ref 150–400)
RBC: 2.75 MIL/uL — ABNORMAL LOW (ref 3.87–5.11)
RDW: 14.2 % (ref 11.5–15.5)
WBC: 8.2 10*3/uL (ref 4.0–10.5)
nRBC: 0 % (ref 0.0–0.2)

## 2022-07-29 LAB — GLUCOSE, CAPILLARY
Glucose-Capillary: 133 mg/dL — ABNORMAL HIGH (ref 70–99)
Glucose-Capillary: 140 mg/dL — ABNORMAL HIGH (ref 70–99)
Glucose-Capillary: 142 mg/dL — ABNORMAL HIGH (ref 70–99)
Glucose-Capillary: 155 mg/dL — ABNORMAL HIGH (ref 70–99)
Glucose-Capillary: 91 mg/dL (ref 70–99)
Glucose-Capillary: 94 mg/dL (ref 70–99)

## 2022-07-29 MED ORDER — ALUM & MAG HYDROXIDE-SIMETH 200-200-20 MG/5ML PO SUSP: 30.0000 mL | ORAL | Status: DC | PRN

## 2022-07-29 MED ORDER — ALUM & MAG HYDROXIDE-SIMETH 200-200-20 MG/5ML PO SUSP
15.0000 mL | ORAL | Status: DC | PRN
  Administered 2022-07-29: 15 mL via ORAL
  Filled 2022-07-29: qty 30

## 2022-07-29 MED ORDER — FUROSEMIDE 10 MG/ML IJ SOLN: 40.0000 mg | Freq: Two times a day (BID) | INTRAMUSCULAR | Status: DC

## 2022-07-29 MED ORDER — VENLAFAXINE HCL ER 75 MG PO CP24
150.0000 mg | ORAL_CAPSULE | Freq: Every day | ORAL | Status: DC
  Administered 2022-07-29 – 2022-08-02 (×5): 150 mg via ORAL
  Filled 2022-07-29: qty 1
  Filled 2022-07-29: qty 2
  Filled 2022-07-29: qty 1
  Filled 2022-07-29 (×2): qty 2
  Filled 2022-07-29: qty 1

## 2022-07-29 MED ORDER — FUROSEMIDE 10 MG/ML IJ SOLN
40.0000 mg | Freq: Two times a day (BID) | INTRAMUSCULAR | Status: AC
  Administered 2022-07-29 (×2): 40 mg via INTRAVENOUS
  Filled 2022-07-29 (×2): qty 4

## 2022-07-29 NOTE — Progress Notes (Deleted)
TCTS Progress Note: 2 Days Post-Op Procedure(s) (LRB): CORONARY ARTERY BYPASS GRAFTING (CABG) X4 USING LEFT INTERNAL MAMMARY ARTERY AND BILATERAL ENDOSCOPIC HARVESTED GREATER SAPHENOUS VEINS (N/A) TRANSESOPHAGEAL ECHOCARDIOGRAM (N/A)  LOS: 6 days   No sig changes  Cont current plan     Latest Ref Rng & Units 07/29/2022    1:46 AM 07/28/2022    7:27 PM 07/28/2022    6:16 AM  CBC  WBC 4.0 - 10.5 K/uL 8.2  8.3    Hemoglobin 12.0 - 15.0 g/dL 8.3  8.5  16.1   Hematocrit 36.0 - 46.0 % 25.9  27.1  30.0   Platelets 150 - 400 K/uL 118  128         Latest Ref Rng & Units 07/29/2022    3:21 AM 07/29/2022    1:46 AM 07/28/2022    7:27 PM  CMP  Glucose 70 - 99 mg/dL 096  045  409   BUN 8 - 23 mg/dL Creatinine 0.44 - 1.00 mg/dL 8.11  9.14  7.82   Sodium 135 - 145 mmol/L 140  137  136   Potassium 3.5 - 5.1 mmol/L 3.8  5.5  3.0   Chloride 98 - 111 mmol/L 103  102  98   CO2 22 - 32 mmol/L Calcium 8.9 - 10.3 mg/dL 8.2  8.1  7.6     ABG    Component Value Date/Time   PHART 7.267 (L) 07/28/2022 0616   PCO2ART 52.1 (H) 07/28/2022 0616   PO2ART 86 07/28/2022 0616   HCO3 23.7 07/28/2022 0616   TCO2 25 07/28/2022 0616   ACIDBASEDEF 3.0 (H) 07/28/2022 0616   O2SAT 95 07/28/2022 9562

## 2022-07-29 NOTE — Progress Notes (Signed)
TCTS Progress Note: 2 Days Post-Op Procedure(s) (LRB): CORONARY ARTERY BYPASS GRAFTING (CABG) X4 USING LEFT INTERNAL MAMMARY ARTERY AND BILATERAL ENDOSCOPIC HARVESTED GREATER SAPHENOUS VEINS (N/A) TRANSESOPHAGEAL ECHOCARDIOGRAM (N/A)  LOS: 6 days   POD 2 CAB   Line draw this AM - and K was 5.5,  This was redrawn and K was 3.8  Doing very well Resp comfortable, 2L [place] Sinus 70s Abd soft mildy distended. LE 1+ edema  Meds:  ASA: yes DVT ppx: yes BB: yes  Lasix  - yes 40 mg BID, has been re-ordered this AM for the same dose.    A/P N: no issues   Patient generally appears uncomfortable. She has her head down on meal tray table and is clutching her heart pillow. With prompting, normal discussion had with her and family regarding postoperative care. At that time appeared comfortable.   Patient states she is not in pain.   She takes an SNRI at home predominately for anxiety. Family asking for this or valium. I discussed with pharmacy. They will restart Effexor (SNRI ) as they do not see any medication interactions.    CV no issues. Not on gtts Resp 2L , no issues, better than expected with smoking hx.  Xr with mild edema, cont diuresis  GI: no issues. Passed gas. Awaiting BM  GU:   Intake/Output Summary (Last 24 hours) at 07/29/2022 1024 Last data filed at 07/29/2022 0600 Gross per 24 hour  Intake 600.69 ml  Output 1410 ml  Net -809.31 ml    Lasix BID is ordered Heme: hgb 8.3 from 8.5, plt 118.  Id s/p periop abx, WC is 8.  Endo no issues T/l/d  DC central line DC foley   Overall plan Keep in ICU If need a bed for another critically ill patient could transfer this patient to floor.         Latest Ref Rng & Units 07/29/2022    1:46 AM 07/28/2022    7:27 PM 07/28/2022    6:16 AM  CBC  WBC 4.0 - 10.5 K/uL 8.2  8.3    Hemoglobin 12.0 - 15.0 g/dL 8.3  8.5  82.9   Hematocrit 36.0 - 46.0 % 25.9  27.1  30.0   Platelets 150 - 400 K/uL 118  128         Latest Ref  Rng & Units 07/29/2022    3:21 AM 07/29/2022    1:46 AM 07/28/2022    7:27 PM  CMP  Glucose 70 - 99 mg/dL 562  130  865   BUN 8 - 23 mg/dL Creatinine 0.44 - 1.00 mg/dL 7.84  6.96  2.95   Sodium 135 - 145 mmol/L 140  137  136   Potassium 3.5 - 5.1 mmol/L 3.8  5.5  3.0   Chloride 98 - 111 mmol/L 103  102  98   CO2 22 - 32 mmol/L Calcium 8.9 - 10.3 mg/dL 8.2  8.1  7.6     ABG    Component Value Date/Time   PHART 7.267 (L) 07/28/2022 0616   PCO2ART 52.1 (H) 07/28/2022 0616   PO2ART 86 07/28/2022 0616   HCO3 23.7 07/28/2022 0616   TCO2 25 07/28/2022 0616   ACIDBASEDEF 3.0 (H) 07/28/2022 0616   O2SAT 95 07/28/2022 2841

## 2022-07-29 NOTE — Progress Notes (Signed)
TCTS Progress Note: 2 Days Post-Op Procedure(s) (LRB): CORONARY ARTERY BYPASS GRAFTING (CABG) X4 USING LEFT INTERNAL MAMMARY ARTERY AND BILATERAL ENDOSCOPIC HARVESTED GREATER SAPHENOUS VEINS (N/A) TRANSESOPHAGEAL ECHOCARDIOGRAM (N/A)  LOS: 6 days  No sig change Cont current care    Latest Ref Rng & Units 07/29/2022    1:46 AM 07/28/2022    7:27 PM 07/28/2022    6:16 AM  CBC  WBC 4.0 - 10.5 K/uL 8.2  8.3    Hemoglobin 12.0 - 15.0 g/dL 8.3  8.5  16.1   Hematocrit 36.0 - 46.0 % 25.9  27.1  30.0   Platelets 150 - 400 K/uL 118  128         Latest Ref Rng & Units 07/29/2022    3:21 AM 07/29/2022    1:46 AM 07/28/2022    7:27 PM  CMP  Glucose 70 - 99 mg/dL 096  045  409   BUN 8 - 23 mg/dL Creatinine 0.44 - 1.00 mg/dL 8.11  9.14  7.82   Sodium 135 - 145 mmol/L 140  137  136   Potassium 3.5 - 5.1 mmol/L 3.8  5.5  3.0   Chloride 98 - 111 mmol/L 103  102  98   CO2 22 - 32 mmol/L Calcium 8.9 - 10.3 mg/dL 8.2  8.1  7.6     ABG    Component Value Date/Time   PHART 7.267 (L) 07/28/2022 0616   PCO2ART 52.1 (H) 07/28/2022 0616   PO2ART 86 07/28/2022 0616   HCO3 23.7 07/28/2022 0616   TCO2 25 07/28/2022 0616   ACIDBASEDEF 3.0 (H) 07/28/2022 0616   O2SAT 95 07/28/2022 9562

## 2022-07-30 ENCOUNTER — Encounter (HOSPITAL_COMMUNITY): Payer: Self-pay | Admitting: Surgery

## 2022-07-30 LAB — BPAM RBC
Blood Product Expiration Date: 202405052359
Blood Product Expiration Date: 202405102359
ISSUE DATE / TIME: 202404161247
ISSUE DATE / TIME: 202404190821
ISSUE DATE / TIME: 202404190821
Unit Type and Rh: 6200
Unit Type and Rh: 6200
Unit Type and Rh: 6200

## 2022-07-30 LAB — BASIC METABOLIC PANEL
Anion gap: 6 (ref 5–15)
BUN: 19 mg/dL (ref 8–23)
CO2: 31 mmol/L (ref 22–32)
Calcium: 7.9 mg/dL — ABNORMAL LOW (ref 8.9–10.3)
Chloride: 103 mmol/L (ref 98–111)
Creatinine, Ser: 0.66 mg/dL (ref 0.44–1.00)
GFR, Estimated: >60 mL/min (ref >60)
Glucose, Bld: 93 mg/dL (ref 70–99)
Potassium: 3.4 mmol/L — ABNORMAL LOW (ref 3.5–5.1)
Sodium: 140 mmol/L (ref 135–145)

## 2022-07-30 LAB — TYPE AND SCREEN
ABO/RH(D): A POS
Antibody Screen: NEGATIVE
Unit division: 0
Unit division: 0
Unit division: 0

## 2022-07-30 LAB — GLUCOSE, CAPILLARY
Glucose-Capillary: 121 mg/dL — ABNORMAL HIGH (ref 70–99)
Glucose-Capillary: 169 mg/dL — ABNORMAL HIGH (ref 70–99)

## 2022-07-30 LAB — CBC
HCT: 26.1 % — ABNORMAL LOW (ref 36.0–46.0)
Hemoglobin: 8 g/dL — ABNORMAL LOW (ref 12.0–15.0)
MCH: 29 pg (ref 26.0–34.0)
MCHC: 30.7 g/dL (ref 30.0–36.0)
MCV: 94.6 fL (ref 80.0–100.0)
Platelets: 123 10*3/uL — ABNORMAL LOW (ref 150–400)
RBC: 2.76 MIL/uL — ABNORMAL LOW (ref 3.87–5.11)
RDW: 14.1 % (ref 11.5–15.5)
WBC: 7.7 10*3/uL (ref 4.0–10.5)
nRBC: 0 % (ref 0.0–0.2)

## 2022-07-30 LAB — MAGNESIUM: Magnesium: 2.3 mg/dL (ref 1.7–2.4)

## 2022-07-30 MED ORDER — POTASSIUM CHLORIDE CRYS ER 20 MEQ PO TBCR
20.0000 meq | EXTENDED_RELEASE_TABLET | ORAL | Status: AC
  Administered 2022-07-30 (×3): 20 meq via ORAL
  Filled 2022-07-30 (×3): qty 1

## 2022-07-30 MED ORDER — FE FUM-VIT C-VIT B12-FA 460-60-0.01-1 MG PO CAPS
1.0000 | ORAL_CAPSULE | Freq: Every day | ORAL | Status: DC
  Administered 2022-07-30 – 2022-08-02 (×4): 1 via ORAL
  Filled 2022-07-30 (×4): qty 1

## 2022-07-30 MED ORDER — ~~LOC~~ CARDIAC SURGERY, PATIENT & FAMILY EDUCATION: Freq: Once | Status: DC

## 2022-07-30 MED ORDER — FUROSEMIDE 40 MG PO TABS
40.0000 mg | ORAL_TABLET | Freq: Every day | ORAL | Status: DC
  Administered 2022-07-31 – 2022-08-02 (×3): 40 mg via ORAL
  Filled 2022-07-30 (×3): qty 1

## 2022-07-30 MED ORDER — SODIUM CHLORIDE 0.9% FLUSH: 3.0000 mL | INTRAVENOUS | Status: DC | PRN

## 2022-07-30 MED ORDER — ASPIRIN 325 MG PO TBEC
325.0000 mg | DELAYED_RELEASE_TABLET | Freq: Every day | ORAL | Status: DC
  Administered 2022-07-31 – 2022-08-02 (×3): 325 mg via ORAL
  Filled 2022-07-30 (×3): qty 1

## 2022-07-30 MED ORDER — METOPROLOL TARTRATE 12.5 MG HALF TABLET
12.5000 mg | ORAL_TABLET | Freq: Two times a day (BID) | ORAL | Status: DC
  Administered 2022-07-30 – 2022-07-31 (×3): 12.5 mg via ORAL
  Filled 2022-07-30 (×3): qty 1

## 2022-07-30 MED ORDER — ORAL CARE MOUTH RINSE: 15.0000 mL | OROMUCOSAL | Status: DC | PRN

## 2022-07-30 MED ORDER — SODIUM CHLORIDE 0.9% FLUSH
3.0000 mL | Freq: Two times a day (BID) | INTRAVENOUS | Status: DC
  Administered 2022-07-30 – 2022-08-02 (×5): 3 mL via INTRAVENOUS

## 2022-07-30 MED ORDER — KETOROLAC TROMETHAMINE 15 MG/ML IJ SOLN
15.0000 mg | Freq: Four times a day (QID) | INTRAMUSCULAR | Status: DC | PRN
  Administered 2022-07-30 – 2022-08-02 (×4): 15 mg via INTRAVENOUS
  Filled 2022-07-30 (×4): qty 1

## 2022-07-30 MED ORDER — SODIUM CHLORIDE 0.9 % IV SOLN: 250.0000 mL | INTRAVENOUS | Status: DC | PRN

## 2022-07-30 MED ORDER — POTASSIUM CHLORIDE CRYS ER 20 MEQ PO TBCR
20.0000 meq | EXTENDED_RELEASE_TABLET | Freq: Two times a day (BID) | ORAL | Status: DC
  Administered 2022-07-31 – 2022-08-02 (×5): 20 meq via ORAL
  Filled 2022-07-30 (×5): qty 1

## 2022-07-30 NOTE — Progress Notes (Signed)
3 Days Post-Op Procedure(s) (LRB): CORONARY ARTERY BYPASS GRAFTING (CABG) X4 USING LEFT INTERNAL MAMMARY ARTERY AND BILATERAL ENDOSCOPIC HARVESTED GREATER SAPHENOUS VEINS (N/A) TRANSESOPHAGEAL ECHOCARDIOGRAM (N/A) Subjective: Chest is sore but otherwise ok. Ambulated yesterday but not this morning yet. Only doing 500 on IS but she was doing it wrong. 750 cc when instructed to do it correctly.  Objective: Vital signs in last 24 hours: Temp:  [97.8 F (36.6 C)-98.4 F (36.9 C)] 97.8 F (36.6 C) (04/22 0400) Pulse Rate:  [69-88] 76 (04/22 0630) Cardiac Rhythm: Normal sinus rhythm (04/21 0800) Resp:  [13-29] 22 (04/22 0630) BP: (96-127)/(59-79) 96/59 (04/22 0600) SpO2:  [90 %-97 %] 95 % (04/22 0630) Weight:  [76.4 kg] 76.4 kg (04/22 0500)  Hemodynamic parameters for last 24 hours:    Intake/Output from previous day: 04/21 0701 - 04/22 0700 In: 380 [P.O.:380] Out: 2080 [Urine:2080] Intake/Output this shift: No intake/output data recorded.  General appearance: alert and cooperative Neurologic: intact Heart: regular rate and rhythm, S1, S2 normal, no murmur Lungs: crackles in bases Extremities: edema mild Wound: dressing dry  Lab Results: Recent Labs    07/29/22 0146 07/30/22 0056  WBC 8.2 7.7  HGB 8.3* 8.0*  HCT 25.9* 26.1*  PLT 118* 123*   BMET:  Recent Labs    07/29/22 0321 07/30/22 0056  NA 140 140  K 3.8 3.4*  CL 103 103  CO2 26 31  GLUCOSE 109* 93  BUN 15 19  CREATININE 0.91 0.66  CALCIUM 8.2* 7.9*    PT/INR:  Recent Labs    07/27/22 1501  LABPROT 17.4*  INR 1.4*   ABG    Component Value Date/Time   PHART 7.267 (L) 07/28/2022 0616   HCO3 23.7 07/28/2022 0616   TCO2 25 07/28/2022 0616   ACIDBASEDEF 3.0 (H) 07/28/2022 0616   O2SAT 95 07/28/2022 0616   CBG (last 3)  Recent Labs    07/29/22 2037 07/29/22 2332 07/30/22 0435  GLUCAP 140* 91 121*    Assessment/Plan: S/P Procedure(s) (LRB): CORONARY ARTERY BYPASS GRAFTING (CABG) X4 USING  LEFT INTERNAL MAMMARY ARTERY AND BILATERAL ENDOSCOPIC HARVESTED GREATER SAPHENOUS VEINS (N/A) TRANSESOPHAGEAL ECHOCARDIOGRAM (N/A)  POD 3  Hemodynamically stable in sinus rhythm. Continue low dose Lopressor.  Volume excess: Wt is 7 lbs over preop. -1700 cc yesterday but recorded wt is up so doubt this is accurate. Continue lasix and kcl  Smoking and COPD. Continue IS, ambulation  Glucose under good control and no hx of DM. Will dc SSI.  Expected postop blood loss anemia: start iron.  Transfer to 4E and continue mobilization.   LOS: 7 days    Alleen Borne 07/30/2022

## 2022-07-30 NOTE — Anesthesia Postprocedure Evaluation (Addendum)
Anesthesia Post Note  Patient: Kaylee Pope  Procedure(s) Performed: CORONARY ARTERY BYPASS GRAFTING (CABG) X4 USING LEFT INTERNAL MAMMARY ARTERY AND BILATERAL ENDOSCOPIC HARVESTED GREATER SAPHENOUS VEINS (Chest) TRANSESOPHAGEAL ECHOCARDIOGRAM     Patient location during evaluation: SICU Anesthesia Type: General Level of consciousness: sedated Pain management: pain level controlled Vital Signs Assessment: post-procedure vital signs reviewed and stable Respiratory status: patient remains intubated per anesthesia plan Cardiovascular status: stable Postop Assessment: no apparent nausea or vomiting Anesthetic complications: no   No notable events documented.            Valencia Kassa

## 2022-07-30 NOTE — Progress Notes (Signed)
CARDIAC REHAB PHASE I   PRE:  Rate/Rhythm: 74 SR  BP:  Sitting: 121/74      SaO2: 98 3L/[place]  MODE:  Ambulation: 470 ft   POST:  Rate/Rhythm: 82 SR  BP:  Sitting: 104/87      SaO2: 98 3L/[place]  Pt ambulated in hall using front wheel walker, minimal assist to standing and contact guard for balance. Tolerated well with no SOB,pain or dizziness. Returned to bed with call bell and bedside table in reach. PT c/o of nausea on and off today, asked floor RN for prn medication. Encouraged ambulation and IS use. Will continue to follow.   [id number]  Woodroe Chen, RN BSN 07/30/2022 2:34 PM

## 2022-07-30 NOTE — Progress Notes (Signed)
Patient arrived at the unit,CHG bath given,vitals checked,patient oriented to the unit

## 2022-07-31 LAB — CBC
HCT: 25.1 % — ABNORMAL LOW (ref 36.0–46.0)
Hemoglobin: 7.5 g/dL — ABNORMAL LOW (ref 12.0–15.0)
MCH: 28.8 pg (ref 26.0–34.0)
MCHC: 29.9 g/dL — ABNORMAL LOW (ref 30.0–36.0)
MCV: 96.5 fL (ref 80.0–100.0)
Platelets: 139 10*3/uL — ABNORMAL LOW (ref 150–400)
RBC: 2.6 MIL/uL — ABNORMAL LOW (ref 3.87–5.11)
RDW: 14 % (ref 11.5–15.5)
WBC: 6.6 10*3/uL (ref 4.0–10.5)
nRBC: 0 % (ref 0.0–0.2)

## 2022-07-31 LAB — BASIC METABOLIC PANEL
Anion gap: 5 (ref 5–15)
BUN: 18 mg/dL (ref 8–23)
CO2: 30 mmol/L (ref 22–32)
Calcium: 8.1 mg/dL — ABNORMAL LOW (ref 8.9–10.3)
Chloride: 102 mmol/L (ref 98–111)
Creatinine, Ser: 0.72 mg/dL (ref 0.44–1.00)
GFR, Estimated: >60 mL/min (ref >60)
Glucose, Bld: 99 mg/dL (ref 70–99)
Potassium: 3.8 mmol/L (ref 3.5–5.1)
Sodium: 137 mmol/L (ref 135–145)

## 2022-07-31 MED ORDER — LACTULOSE 10 GM/15ML PO SOLN
20.0000 g | Freq: Once | ORAL | Status: AC
  Administered 2022-07-31: 20 g via ORAL
  Filled 2022-07-31: qty 30

## 2022-07-31 NOTE — Progress Notes (Addendum)
      [address]       [place] [address]             [phone]      4 Days Post-Op Procedure(s) (LRB): CORONARY ARTERY BYPASS GRAFTING (CABG) X4 USING LEFT INTERNAL MAMMARY ARTERY AND BILATERAL ENDOSCOPIC HARVESTED GREATER SAPHENOUS VEINS (N/A) TRANSESOPHAGEAL ECHOCARDIOGRAM (N/A) Subjective: Transferred form ICU yesterday. Sitting up in bed, no new concerns.  Walked 470' yesterday afternoon.  Passing flatus, no BM yet.  Objective: Vital signs in last 24 hours: Temp:  [97.4 F (36.3 C)-98.4 F (36.9 C)] 98.4 F (36.9 C) (04/23 0643) Pulse Rate:  [69-92] 92 (04/23 0643) Cardiac Rhythm: Normal sinus rhythm (04/22 1904) Resp:  [18-26] 18 (04/23 0643) BP: (108-138)/(68-83) 120/68 (04/23 0643) SpO2:  [85 %-97 %] 93 % (04/23 0643) Weight:  [77.6 kg] 77.6 kg (04/23 0643)     Intake/Output from previous day: 04/22 0701 - 04/23 0700 In: 490 [P.O.:480; I.V.:10] Out: 0  Intake/Output this shift: No intake/output data recorded.  General appearance: alert, cooperative, and no distress Neurologic: intact Heart: NSR, no arrhythmias on monitor Lungs: breath sounds clear, shallow bases Abdomen: soft, no tenderness Extremities: minimal peripheral edema, bilateral LE EVH incisions are intact and dry Wound: the sternotomy incision is covered with a dry dressing  Lab Results: Recent Labs    07/30/22 0056 07/31/22 0132  WBC 7.7 6.6  HGB 8.0* 7.5*  HCT 26.1* 25.1*  PLT 123* 139*   BMET:  Recent Labs    07/30/22 0056 07/31/22 0132  NA 140 137  K 3.4* 3.8  CL 103 102  CO2 31 30  GLUCOSE 93 99  BUN 19 18  CREATININE 0.66 0.72  CALCIUM 7.9* 8.1*    PT/INR: No results for input(s): "LABPROT", "INR" in the last 72 hours. ABG    Component Value Date/Time   PHART 7.267 (L) 07/28/2022 0616   HCO3 23.7 07/28/2022 0616   TCO2 25 07/28/2022 0616   ACIDBASEDEF 3.0 (H) 07/28/2022 0616   O2SAT 95 07/28/2022 0616   CBG (last 3)  Recent Labs     07/29/22 2332 07/30/22 0435 07/30/22 0747  GLUCAP 91 121* 169*    Assessment/Plan: S/P Procedure(s) (LRB): CORONARY ARTERY BYPASS GRAFTING (CABG) X4 USING LEFT INTERNAL MAMMARY ARTERY AND BILATERAL ENDOSCOPIC HARVESTED GREATER SAPHENOUS VEINS (N/A) TRANSESOPHAGEAL ECHOCARDIOGRAM (N/A)  -POD4 CABG x 4, normal EF. Progressing well.  On ASA, BB, statin. Will remove pacer wires and sternal dressing today.   -Volume excess-Wt increased by 3lbs after transfer, doubt accuracy. Continuing Lasix daily.   -Expected acute blood loss anemia- Hct down slightly from yesterday. Continue Fe replacement.  -Hypoxia-working on pulmonary hygiene and ambulation. O2 at less than 1L/min.  Reported productive cough last evening.  Will add Mucinex.   -GI- tolerating PO's without nausea, no BM yet. Lactulose today.   -Disposition- nearly independent with mobility but still requiring supplemental O2. Planning for discharge to home in 1-2 days.    LOS: 8 days    Parke Poisson [phone] 07/31/2022   Chart reviewed, patient examined, agree with above. Her baseline preop oxygen sats were in the low 90's. She may need to go home on oxygen.

## 2022-07-31 NOTE — Progress Notes (Addendum)
CARDIAC REHAB PHASE I   PRE:  Rate/Rhythm: 82 SR  BP:  Sitting: 110/79      SaO2: 96 1L/[place]  MODE:  Ambulation: 470 ft   POST:  Rate/Rhythm: 86 SR   BP:  Sitting: 135/82      SaO2: 94 1L/[place]  Pt ambulated in hall using front wheel walker, minimal assist to standing and hands on for balance. Tolerated well with mild SOB towards end of walk. No pain or dizziness. Pt moved with slow steady pace stopping once for standing break. Returned to bed with call bell and bedside table in reach. Attempted RA once relaxed in bed, O2 sats 85-87, pt denied SOB. Placed back on NC1L for now. Practiced IS and encouraged use today. Also encouraged continued ambulation. Will continue to follow.     [id number] Woodroe Chen, RN BSN 07/31/2022 10:39 AM

## 2022-07-31 NOTE — Discharge Instructions (Signed)

## 2022-08-01 ENCOUNTER — Inpatient Hospital Stay (HOSPITAL_COMMUNITY): Payer: Medicare HMO

## 2022-08-01 MED ORDER — AMLODIPINE BESYLATE 5 MG PO TABS
5.0000 mg | ORAL_TABLET | Freq: Every day | ORAL | Status: DC
  Administered 2022-08-01 – 2022-08-02 (×2): 5 mg via ORAL
  Filled 2022-08-01 (×2): qty 1

## 2022-08-01 MED ORDER — METOPROLOL TARTRATE 25 MG PO TABS
25.0000 mg | ORAL_TABLET | Freq: Two times a day (BID) | ORAL | Status: DC
  Administered 2022-08-01 (×2): 25 mg via ORAL
  Filled 2022-08-01 (×2): qty 1

## 2022-08-01 MED ORDER — MAGNESIUM CITRATE PO SOLN
0.5000 | Freq: Once | ORAL | Status: AC | PRN
  Administered 2022-08-02: 0.5 via ORAL
  Filled 2022-08-01 (×2): qty 296

## 2022-08-01 MED ORDER — LACTULOSE 10 GM/15ML PO SOLN
20.0000 g | Freq: Once | ORAL | Status: AC
  Administered 2022-08-01: 20 g via ORAL
  Filled 2022-08-01: qty 30

## 2022-08-01 MED FILL — Heparin Sodium (Porcine) Inj 1000 Unit/ML: Qty: 1000 | Status: AC

## 2022-08-01 MED FILL — Potassium Chloride Inj 2 mEq/ML: INTRAVENOUS | Qty: 40 | Status: AC

## 2022-08-01 MED FILL — Lidocaine HCl Local Preservative Free (PF) Inj 2%: INTRAMUSCULAR | Qty: 14 | Status: AC

## 2022-08-01 NOTE — Progress Notes (Addendum)
      [address]       [place] [address]             [phone]      5 Days Post-Op Procedure(s) (LRB): CORONARY ARTERY BYPASS GRAFTING (CABG) X4 USING LEFT INTERNAL MAMMARY ARTERY AND BILATERAL ENDOSCOPIC HARVESTED GREATER SAPHENOUS VEINS (N/A) TRANSESOPHAGEAL ECHOCARDIOGRAM (N/A) Subjective:  Eating breakfast, says she had more chest discomfort yesterday that improved with Tramadol.  Walked in the hall yesterday on RA but sats dropped into the mid 80's. SaO2 recovered with with O2 at 1L/[place].  Passing flatus, no BM yet after Lactulose yesterday.  Objective: Vital signs in last 24 hours: Temp:  [97.8 F (36.6 C)-98.6 F (37 C)] 97.8 F (36.6 C) (04/24 0309) Pulse Rate:  [77-89] 77 (04/24 0309) Cardiac Rhythm: Normal sinus rhythm (04/23 1900) Resp:  [20-21] 20 (04/24 0309) BP: (110-148)/(66-99) 148/99 (04/24 0309) SpO2:  [94 %-97 %] 95 % (04/24 0309) Weight:  [77.5 kg] 77.5 kg (04/24 0304)     Intake/Output from previous day: 04/23 0701 - 04/24 0700 In: 483 [P.O.:480; I.V.:3] Out: -  Intake/Output this shift: No intake/output data recorded.  General appearance: alert, cooperative, and no distress Neurologic: intact Heart: NSR, no arrhythmias on monitor Lungs: breath sounds clear, shallow bases Abdomen: soft, no tenderness Extremities: no peripheral edema, bilateral LE EVH incisions are intact and dry Wound: the sternotomy incision is clean, well approximated and dry  Lab Results: Recent Labs    07/30/22 0056 07/31/22 0132  WBC 7.7 6.6  HGB 8.0* 7.5*  HCT 26.1* 25.1*  PLT 123* 139*    BMET:  Recent Labs    07/30/22 0056 07/31/22 0132  NA 140 137  K 3.4* 3.8  CL 103 102  CO2 31 30  GLUCOSE 93 99  BUN 19 18  CREATININE 0.66 0.72  CALCIUM 7.9* 8.1*     PT/INR: No results for input(s): "LABPROT", "INR" in the last 72 hours. ABG    Component Value Date/Time   PHART 7.267 (L) 07/28/2022 0616   HCO3 23.7 07/28/2022 0616    TCO2 25 07/28/2022 0616   ACIDBASEDEF 3.0 (H) 07/28/2022 0616   O2SAT 95 07/28/2022 0616   CBG (last 3)  Recent Labs    07/29/22 2332 07/30/22 0435 07/30/22 0747  GLUCAP 91 121* 169*     Assessment/Plan: S/P Procedure(s) (LRB): CORONARY ARTERY BYPASS GRAFTING (CABG) X4 USING LEFT INTERNAL MAMMARY ARTERY AND BILATERAL ENDOSCOPIC HARVESTED GREATER SAPHENOUS VEINS (N/A) TRANSESOPHAGEAL ECHOCARDIOGRAM (N/A)  -POD5 CABG x 4, normal EF. Progressing well.  On ASA, BB, statin. BP trending up with MAP 100-110. On carvedilol and amlodipine prior to admission. Resume the amlodipine today and increase the metoprolol to  BID.   -Volume excess-Wt about 5lbs+ if accurate. Continuing Lasix daily.   -Expected acute blood loss anemia-  Continue Fe replacement.  -Hypoxia- desaturated into the mid 80's on RA while walking yesterday, recovered on 1L/min O2.  Working on pulmonary hygiene and ambulation.  Will arrange for home O2.  -GI- tolerating PO's without nausea, no BM yet. Mg Citrate this afternoon if no BM this morning.   -Disposition- Planning for discharge to home later today or in AM.    LOS: 9 days    Leary Roca, Cordelia Poche [phone] 08/01/2022   Chart reviewed, patient examined, agree with above. I think she will benefit from another day of mobilization and getting her bowels moving well before she goes home with marginal lungs.

## 2022-08-01 NOTE — Progress Notes (Signed)
Mobility Specialist: Progress Note   08/01/22 1711  Mobility  Activity Ambulated with assistance in hallway  Level of Assistance Standby assist, set-up cues, supervision of patient - no hands on  Assistive Device Front wheel walker  Distance Ambulated (ft) 470 ft  RUE Weight Bearing NWB  LUE Weight Bearing NWB  Activity Response Tolerated well  Mobility Referral Yes  $Mobility charge 1 Mobility   Pre-Mobility: 93 HR, 93% SpO2 During Mobility: 101 HR, 90% SpO2 Post-Mobility: 78 HR  Pt received in the bed and agreeable to mobility. Mod I with bed mobility and standby during ambulation. Ambulated on 1 L/min [place]. Stopped x1 for standing break secondary to general fatigue. Pt back to bed after session with call bell and phone in reach.   Bona Hubbard Mobility Specialist Please contact via SecureChat or Rehab office at [phone]

## 2022-08-01 NOTE — Progress Notes (Signed)
CARDIAC REHAB PHASE I   PRE:  Rate/Rhythm: 83 SR  BP:  Sitting: 134/77      SaO2: 96 1L/[place]  MODE:  Ambulation: 470 ft   POST:  Rate/Rhythm: 90 SR  BP:  Sitting: 138/78      SaO2: 94 1L/[place]  Pt ambulated in hall with front wheel walker, tolerating well with no pain, dizziness or SOB. Pt required no assistance standing from bed and contact guard only for walk. Maintaining sternal precautions well, with no cues. Returned to bed with call bell and bedside table in reach. Began brief OHS home education, will complete tomorrow. Pt has friend in room and wanted to visit. Will continue to follow.   [id number]   Woodroe Chen, RN BSN 08/01/2022 2:19 PM

## 2022-08-01 NOTE — Progress Notes (Signed)
SATURATION QUALIFICATIONS: (This note is used to comply with regulatory documentation for home oxygen)  Patient Saturations on Room Air at Rest = 88%  Patient Saturations on Room Air while Ambulating = 85%  Patient Saturations on 2 Liters of oxygen while Ambulating = 96%

## 2022-08-01 NOTE — TOC Initial Note (Signed)
Transition of Care (TOC) - Initial/Assessment Note  Donn Pierini RN, BSN Transitions of Care Unit 4E- RN Case Manager See Treatment Team for direct phone #   Patient Details  Name: Kaylee Pope MRN: [id number] Date of Birth: 1955/12/25  Transition of Care [facility]) CM/SW Contact:    Darrold Span, RN Phone Number: 08/01/2022, 1:16 PM  Clinical Narrative:                 Noted order for home 02 needs, cardiac rehab has also sent msg that pt needs rollator for home.   CM in to speak with pt and spouse at bedside. Discussed home 02 needs- choice offered for home 02 provider ([facility] uses [facility]), pt voiced she does not have a preference as long as provider takes her insurance. Pt expressed that she has several more procedures scheduled in the coming months.   Pt also voiced she would like walker w/ seat (rollator)  Address, phone # and PCP all confirmed in epic, spouse to transport home.   Call made to [facility] for home 02 needs- awaiting return call.  1300- received call from [facility] for home [phone] referral- referral has been accepted and they will process for delivery today with anticipated discharge in the am.   TOC to follow for any further needs.     Expected Discharge Plan: Home/Self Care Barriers to Discharge: Continued Medical Work up   Patient Goals and CMS Choice Patient states their goals for this hospitalization and ongoing recovery are:: return home CMS Medicare.gov Compare Post Acute Care list provided to:: Patient Choice offered to / list presented to : Patient, Spouse      Expected Discharge Plan and Services   Discharge Planning Services: CM Consult Post Acute Care Choice: Durable Medical Equipment Living arrangements for the past 2 months: Single Family Home                 DME Arranged: Walker rolling with seat, Oxygen DME Agency: [facility] Date DME Agency Contacted: 08/01/22 Time DME Agency Contacted: 1300 Representative  spoke with at DME Agency: Fayrene Fearing HH Arranged: NA HH Agency: NA        Prior Living Arrangements/Services Living arrangements for the past 2 months: Single Family Home Lives with:: Spouse Patient language and need for interpreter reviewed:: Yes Do you feel safe going back to the place where you live?: Yes      Need for Family Participation in Patient Care: Yes (Comment) Care giver support system in place?: Yes (comment) Current home services: DME Criminal Activity/Legal Involvement Pertinent to Current Situation/Hospitalization: No - Comment as needed  Activities of Daily Living   ADL Screening (condition at time of admission) Patient's cognitive ability adequate to safely complete daily activities?: Yes Is the patient deaf or have difficulty hearing?: No Does the patient have difficulty seeing, even when wearing glasses/contacts?: No Does the patient have difficulty concentrating, remembering, or making decisions?: No Patient able to express need for assistance with ADLs?: Yes Does the patient have difficulty dressing or bathing?: No Independently performs ADLs?: Yes (appropriate for developmental age) Does the patient have difficulty walking or climbing stairs?: No Weakness of Legs: None Weakness of Arms/Hands: None  Permission Sought/Granted Permission sought to share information with : Facility Industrial/product designer granted to share information with : Yes, Verbal Permission Granted     Permission granted to share info w AGENCY: DME        Emotional Assessment Appearance:: Appears stated  age Attitude/Demeanor/Rapport: Engaged Affect (typically observed): Accepting, Appropriate Orientation: : Oriented to Self, Oriented to Place, Oriented to  Time, Oriented to Situation Alcohol / Substance Use: Not Applicable Psych Involvement: No (comment)  Admission diagnosis:  Coronary artery disease involving native coronary artery of native heart with angina pectoris  [I25.119] S/P CABG x 4 [Z95.1] Patient Active Problem List   Diagnosis Date Noted   S/P CABG x 4 07/27/2022   Coronary artery disease involving native coronary artery of native heart with angina pectoris 07/19/2022   Thoracoabdominal aortic aneurysm (TAAA) without rupture 07/19/2022   Well woman exam with routine gynecological exam 10/28/2014   Obesity 10/28/2014   Right hand pain 03/03/2014   UNSPECIFIED VITAMIN D DEFICIENCY 06/14/2009   Hypothyroidism 04/25/2007   HYPERLIPIDEMIA 04/25/2007   TOBACCO ABUSE 04/25/2007   DEPRESSIVE DISORDER NOT ELSEWHERE CLASSIFIED 04/25/2007   Essential hypertension 04/25/2007   PCP:  Jerl Mina, MD Pharmacy:   [facility] [address], [place] - [address] [place] [address] Phone: [phone] Fax: [phone]  [facility] [place], [place] - [facility] AT Portal to Registered [facility] Sites [address] [place] [address] Phone: [phone] Fax: [phone]     Social Determinants of Health (SDOH) Social History: SDOH Screenings   Tobacco Use: High Risk (07/30/2022)   SDOH Interventions:     Readmission Risk Interventions     No data to display

## 2022-08-01 NOTE — Care Management Important Message (Signed)
Important Message  Patient Details  Name: Kaylee Pope MRN: [id number] Date of Birth: 1955/08/24   Medicare Important Message Given:  Yes     Renie Ora 08/01/2022, 10:17 AM

## 2022-08-02 ENCOUNTER — Other Ambulatory Visit (HOSPITAL_COMMUNITY): Payer: Self-pay

## 2022-08-02 MED ORDER — ACETAMINOPHEN 325 MG PO TABS: 650.0000 mg | ORAL_TABLET | Freq: Four times a day (QID) | ORAL | Status: DC | PRN

## 2022-08-02 MED ORDER — MAGNESIUM CITRATE PO SOLN: 1.0000 | Freq: Once | ORAL | Status: DC | PRN

## 2022-08-02 MED ORDER — POTASSIUM CHLORIDE CRYS ER 20 MEQ PO TBCR
20.0000 meq | EXTENDED_RELEASE_TABLET | Freq: Every day | ORAL | 0 refills | Status: DC
  Filled 2022-08-02 (×2): qty 7, 7d supply, fill #0

## 2022-08-02 MED ORDER — LACTULOSE 10 GM/15ML PO SOLN
30.0000 g | Freq: Once | ORAL | Status: AC
  Administered 2022-08-02: 30 g via ORAL
  Filled 2022-08-02: qty 45

## 2022-08-02 MED ORDER — OXYCODONE-ACETAMINOPHEN 5-325 MG PO TABS
1.0000 | ORAL_TABLET | Freq: Four times a day (QID) | ORAL | 0 refills | Status: AC | PRN
  Filled 2022-08-02: qty 15, 4d supply, fill #0

## 2022-08-02 MED ORDER — FE FUM-VIT C-VIT B12-FA 460-60-0.01-1 MG PO CAPS: 1.0000 | ORAL_CAPSULE | Freq: Every day | ORAL | 0 refills | Status: DC

## 2022-08-02 MED ORDER — FUROSEMIDE 40 MG PO TABS: 40.0000 mg | ORAL_TABLET | Freq: Every day | ORAL | 0 refills | Status: DC

## 2022-08-02 MED ORDER — FE FUM-VIT C-VIT B12-FA 460-60-0.01-1 MG PO CAPS
1.0000 | ORAL_CAPSULE | Freq: Every day | ORAL | 0 refills | Status: DC
  Filled 2022-08-02: qty 30, 30d supply, fill #0

## 2022-08-02 MED ORDER — ASPIRIN 325 MG PO TBEC: 325.0000 mg | DELAYED_RELEASE_TABLET | Freq: Every day | ORAL | Status: DC

## 2022-08-02 MED ORDER — CARVEDILOL 6.25 MG PO TABS
6.2500 mg | ORAL_TABLET | Freq: Two times a day (BID) | ORAL | Status: DC
  Administered 2022-08-02: 6.25 mg via ORAL
  Filled 2022-08-02: qty 1

## 2022-08-02 MED ORDER — FUROSEMIDE 40 MG PO TABS
40.0000 mg | ORAL_TABLET | Freq: Every day | ORAL | 0 refills | Status: DC
  Filled 2022-08-02: qty 7, 7d supply, fill #0

## 2022-08-02 MED ORDER — POTASSIUM CHLORIDE CRYS ER 20 MEQ PO TBCR: 20.0000 meq | EXTENDED_RELEASE_TABLET | Freq: Every day | ORAL | 0 refills | Status: DC

## 2022-08-02 MED ORDER — AMLODIPINE BESYLATE 5 MG PO TABS
5.0000 mg | ORAL_TABLET | Freq: Every day | ORAL | 2 refills | Status: DC
  Filled 2022-08-02: qty 30, 30d supply, fill #0

## 2022-08-02 MED ORDER — ~~LOC~~ CARDIAC SURGERY, PATIENT & FAMILY EDUCATION: 1.0000 | Freq: Once | Status: AC

## 2022-08-02 MED ORDER — ASPIRIN 325 MG PO TBEC
325.0000 mg | DELAYED_RELEASE_TABLET | Freq: Every day | ORAL | 2 refills | Status: DC
  Filled 2022-08-02: qty 100, 100d supply, fill #0

## 2022-08-02 NOTE — Progress Notes (Signed)
CARDIAC REHAB PHASE I   PRE:  Rate/Rhythm: 82 Sr   BP:  Sitting: 121/74      SaO2: 96 1L/[place]  MODE:  Ambulation: 470 ft   POST:  Rate/Rhythm: 85 SR  BP:  Sitting: 106/79      SaO2: 94 1L/[place]   Pt ambulated in hall using front wheel walker, moving at slow steady pace. Tolerated well with no pain, dizziness and mild SOB towards end of walk. Returned to bed with call bell and bedside table in reach. Post OHS education including site care, restrictions, risk factors, smoking cessation, IS use at home, home needs at discharge, heart healthy diet and CRP2 reviewed. All questions and concerns addressed. Will refer to [facility] for CRP2. Plan for home today.   [id number]  Woodroe Chen, RN BSN 08/02/2022 11:53 AM

## 2022-08-02 NOTE — TOC Transition Note (Signed)
Transition of Care (TOC) - CM/SW Discharge Note Donn Pierini RN, BSN Transitions of Care Unit 4E- RN Case Manager See Treatment Team for direct phone #   Patient Details  Name: Kaylee Pope MRN: [id number] Date of Birth: 1955/09/23  Transition of Care [facility]) CM/SW Contact:  Darrold Span, RN Phone Number: 08/02/2022, 1:39 PM   Clinical Narrative:    Pt stable for transition home today, [facility] delivered portable 02 and rollator to the room for home.  Pt and spouse asking about lighter weight portable 02- CM has spoken with them along with daughter at the bedside- Spouse is asking about portable 02 that weighs about a pound- explained that DME agencies do not carry that, that they do carry portable concentrators that we can see if pt qualifies for and this would be lighter than the portable tank- spoke with Lowella Dandy PA- who gave verbal order for POC- [facility] liaison- Fayrene Fearing to process and have POC delivered to home and change out equipment. [facility] unable to deliver to room as it can not process in time for discharge today. Anticipate delivery later tomorrow afternoon to the home.   Pt and family have been updated that [facility] will be changing out portable 02 for the portable concentrator with delivery to home.   No further TOC needs noted, family to transport home.    Final next level of care: Home/Self Care Barriers to Discharge: Barriers Resolved   Patient Goals and CMS Choice CMS Medicare.gov Compare Post Acute Care list provided to:: Patient Choice offered to / list presented to : Patient, Spouse  Discharge Placement                 Home        Discharge Plan and Services Additional resources added to the After Visit Summary for     Discharge Planning Services: CM Consult Post Acute Care Choice: Durable Medical Equipment          DME Arranged: Walker rolling with seat, Oxygen DME Agency: [facility] Date DME Agency Contacted: 08/01/22 Time DME  Agency Contacted: 1300 Representative spoke with at DME Agency: Fayrene Fearing HH Arranged: NA HH Agency: NA        Social Determinants of Health (SDOH) Interventions SDOH Screenings   Tobacco Use: High Risk (07/30/2022)     Readmission Risk Interventions    08/02/2022    1:39 PM  Readmission Risk Prevention Plan  Post Dischage Appt Complete  Medication Screening Complete  Transportation Screening Complete

## 2022-08-02 NOTE — Progress Notes (Signed)
Discharge instructions given. Patient verbalized understanding and all questions were answered.  ?

## 2022-08-02 NOTE — Progress Notes (Signed)
6 Days Post-Op Procedure(s) (LRB): CORONARY ARTERY BYPASS GRAFTING (CABG) X4 USING LEFT INTERNAL MAMMARY ARTERY AND BILATERAL ENDOSCOPIC HARVESTED GREATER SAPHENOUS VEINS (N/A) TRANSESOPHAGEAL ECHOCARDIOGRAM (N/A) Subjective: Feels ok but no BM yesterday. Received mag citrate. She is passing a lot of gas. Ambulated this am.  Objective: Vital signs in last 24 hours: Temp:  [97.5 F (36.4 C)-97.9 F (36.6 C)] 97.9 F (36.6 C) (04/25 0608) Pulse Rate:  [77-90] 90 (04/25 0608) Cardiac Rhythm: Normal sinus rhythm (04/24 1900) Resp:  [18-20] 18 (04/25 0608) BP: (122-144)/(69-85) 144/85 (04/25 0608) SpO2:  [94 %-99 %] 94 % (04/25 1610) Weight:  [77.8 kg] 77.8 kg (04/25 0608)  Hemodynamic parameters for last 24 hours:    Intake/Output from previous day: No intake/output data recorded. Intake/Output this shift: No intake/output data recorded.  General appearance: alert and cooperative Neurologic: intact Heart: regular rate and rhythm, S1, S2 normal, no murmur Lungs: diminished breath sounds bibasilar Abdomen: soft, non-tender; bowel sounds normal Extremities: edema minimal Wound: chest incision healing well.   Lab Results: Recent Labs    07/31/22 0132  WBC 6.6  HGB 7.5*  HCT 25.1*  PLT 139*   BMET:  Recent Labs    07/31/22 0132  NA 137  K 3.8  CL 102  CO2 30  GLUCOSE 99  BUN 18  CREATININE 0.72  CALCIUM 8.1*    PT/INR: No results for input(s): "LABPROT", "INR" in the last 72 hours. ABG    Component Value Date/Time   PHART 7.267 (L) 07/28/2022 0616   HCO3 23.7 07/28/2022 0616   TCO2 25 07/28/2022 0616   ACIDBASEDEF 3.0 (H) 07/28/2022 0616   O2SAT 95 07/28/2022 0616   CBG (last 3)  Recent Labs    07/30/22 0747  GLUCAP 169*    Assessment/Plan: S/P Procedure(s) (LRB): CORONARY ARTERY BYPASS GRAFTING (CABG) X4 USING LEFT INTERNAL MAMMARY ARTERY AND BILATERAL ENDOSCOPIC HARVESTED GREATER SAPHENOUS VEINS (N/A) TRANSESOPHAGEAL ECHOCARDIOGRAM (N/A)  POD  6 Hemodynamically stable in sinus rhythm. Will switch metoprolol to her preop Coreg which may help to control her BP better.  Wt is about the same as yesterday. Still probably 5 lbs over preop if accurate. Continue lasix daily. Would plan to keep her on this for a week at home with KCL  Continue iron for postop acute blood loss anemia.  Sending home on oxygen.  Will give more mag citrate this am and if bowels move she can go home later.   LOS: 10 days    Alleen Borne 08/02/2022

## 2022-08-06 DIAGNOSIS — Z4802 Encounter for removal of sutures: Secondary | ICD-10-CM

## 2022-08-07 ENCOUNTER — Ambulatory Visit: Payer: Medicare HMO | Admitting: Cardiology

## 2022-08-09 MED FILL — Calcium Chloride Inj 10%: INTRAVENOUS | Qty: 10 | Status: AC

## 2022-08-09 MED FILL — Heparin Sodium (Porcine) Inj 1000 Unit/ML: INTRAMUSCULAR | Qty: 20 | Status: AC

## 2022-08-09 MED FILL — Lidocaine HCl Local Soln Prefilled Syringe 100 MG/5ML (2%): INTRAMUSCULAR | Qty: 5 | Status: AC

## 2022-08-09 MED FILL — Sodium Chloride IV Soln 0.9%: INTRAVENOUS | Qty: 3000 | Status: AC

## 2022-08-09 MED FILL — Electrolyte-R (PH 7.4) Solution: INTRAVENOUS | Qty: 4000 | Status: AC

## 2022-08-09 MED FILL — Sodium Bicarbonate IV Soln 8.4%: INTRAVENOUS | Qty: 50 | Status: AC

## 2022-08-13 ENCOUNTER — Telehealth: Payer: Self-pay | Admitting: Internal Medicine

## 2022-08-13 NOTE — Telephone Encounter (Signed)
Spoke with patient's daughter April to inform her the appointment scheduled for 08/14/22 is as post cath follow-up. April was satisfied with response.

## 2022-08-13 NOTE — Telephone Encounter (Signed)
Daughter wants call back to discuss upcoming visit.

## 2022-08-14 ENCOUNTER — Ambulatory Visit: Payer: Medicare HMO | Attending: Physician Assistant | Admitting: Physician Assistant

## 2022-08-14 ENCOUNTER — Other Ambulatory Visit: Payer: Self-pay

## 2022-08-14 ENCOUNTER — Other Ambulatory Visit
Admission: RE | Admit: 2022-08-14 | Discharge: 2022-08-14 | Disposition: A | Discharge status: Home / Self Care | Payer: Medicare HMO | Source: Ambulatory Visit | Attending: Physician Assistant | Admitting: Physician Assistant

## 2022-08-14 ENCOUNTER — Telehealth: Payer: Self-pay | Admitting: Physician Assistant

## 2022-08-14 ENCOUNTER — Telehealth: Payer: Self-pay | Admitting: Internal Medicine

## 2022-08-14 ENCOUNTER — Encounter: Payer: Self-pay | Admitting: Physician Assistant

## 2022-08-14 VITALS — BP 116/72 | HR 75 | Ht 60.0 in | Wt 171.0 lb

## 2022-08-14 DIAGNOSIS — E785 Hyperlipidemia, unspecified: Secondary | ICD-10-CM

## 2022-08-14 DIAGNOSIS — Z951 Presence of aortocoronary bypass graft: Secondary | ICD-10-CM | POA: Insufficient documentation

## 2022-08-14 DIAGNOSIS — I2510 Atherosclerotic heart disease of native coronary artery without angina pectoris: Secondary | ICD-10-CM | POA: Diagnosis not present

## 2022-08-14 DIAGNOSIS — E8779 Other fluid overload: Secondary | ICD-10-CM

## 2022-08-14 DIAGNOSIS — E782 Mixed hyperlipidemia: Secondary | ICD-10-CM | POA: Diagnosis not present

## 2022-08-14 DIAGNOSIS — I716 Thoracoabdominal aortic aneurysm, without rupture, unspecified: Secondary | ICD-10-CM

## 2022-08-14 DIAGNOSIS — D62 Acute posthemorrhagic anemia: Secondary | ICD-10-CM

## 2022-08-14 DIAGNOSIS — R0902 Hypoxemia: Secondary | ICD-10-CM

## 2022-08-14 DIAGNOSIS — I10 Essential (primary) hypertension: Secondary | ICD-10-CM

## 2022-08-14 LAB — CBC
HCT: 32 % — ABNORMAL LOW (ref 36.0–46.0)
Hemoglobin: 9.3 g/dL — ABNORMAL LOW (ref 12.0–15.0)
MCH: 28.7 pg (ref 26.0–34.0)
MCHC: 29.1 g/dL — ABNORMAL LOW (ref 30.0–36.0)
MCV: 98.8 fL (ref 80.0–100.0)
Platelets: 261 10*3/uL (ref 150–400)
RBC: 3.24 MIL/uL — ABNORMAL LOW (ref 3.87–5.11)
RDW: 16 % — ABNORMAL HIGH (ref 11.5–15.5)
WBC: 5.8 10*3/uL (ref 4.0–10.5)
nRBC: 0 % (ref 0.0–0.2)

## 2022-08-14 LAB — ECHO INTRAOPERATIVE TEE
AR max vel: 2.43 cm2
AV Area VTI: 1.54 cm2
AV Area mean vel: 2 cm2
AV Mean grad: 3 mmHg
AV Peak grad: 4.9 mmHg
AV Vena cont: 0.2 cm
Ao pk vel: 1.11 m/s
Height: 60 in
MV VTI: 2.07 cm2
Weight: 2578.5 oz

## 2022-08-14 LAB — BASIC METABOLIC PANEL
Anion gap: 8 (ref 5–15)
BUN: 15 mg/dL (ref 8–23)
CO2: 29 mmol/L (ref 22–32)
Calcium: 8.7 mg/dL — ABNORMAL LOW (ref 8.9–10.3)
Chloride: 102 mmol/L (ref 98–111)
Creatinine, Ser: 0.72 mg/dL (ref 0.44–1.00)
GFR, Estimated: >60 mL/min (ref >60)
Glucose, Bld: 89 mg/dL (ref 70–99)
Potassium: 4 mmol/L (ref 3.5–5.1)
Sodium: 139 mmol/L (ref 135–145)

## 2022-08-14 MED ORDER — FUROSEMIDE 40 MG PO TABS: 40.0000 mg | ORAL_TABLET | Freq: Every day | ORAL | 3 refills | Status: DC

## 2022-08-14 MED ORDER — POTASSIUM CHLORIDE CRYS ER 20 MEQ PO TBCR: 20.0000 meq | EXTENDED_RELEASE_TABLET | Freq: Every day | ORAL | 3 refills | Status: DC

## 2022-08-14 MED ORDER — POTASSIUM CHLORIDE CRYS ER 20 MEQ PO TBCR: 20.0000 meq | EXTENDED_RELEASE_TABLET | Freq: Once | ORAL | 3 refills | Status: DC

## 2022-08-14 NOTE — Patient Instructions (Signed)
Medication Instructions:  Your physician has recommended you make the following change in your medication:   START - furosemide (LASIX) 40 MG tablet - Take 1 tablet (40 mg total) by mouth daily START - potassium chloride SA (KLOR-CON M) 20 MEQ tablet - Take 1 tablet (20 mEq total) by mouth daily.  *If you need a refill on your cardiac medications before your next appointment, please call your pharmacy*   Lab Work: Your physician recommends that you return for lab work today and again in 1 week: CBC & BMET Medical Mall Entrance at [facility] 1st desk on the right to check in (REGISTRATION)  Lab hours: Monday- Friday (7:30 am- 5:30 pm)  If you have labs (blood work) drawn today and your tests are completely normal, you will receive your results only by: MyChart Message (if you have MyChart) OR A paper copy in the mail If you have any lab test that is abnormal or we need to change your treatment, we will call you to review the results.   Testing/Procedures: -None ordered   Follow-Up: At [facility], you and your health needs are our priority.  As part of our continuing mission to provide you with exceptional heart care, we have created designated Provider Care Teams.  These Care Teams include your primary Cardiologist (physician) and Advanced Practice Providers (APPs -  Physician Assistants and Nurse Practitioners) who all work together to provide you with the care you need, when you need it.  We recommend signing up for the patient portal called "MyChart".  Sign up information is provided on this After Visit Summary.  MyChart is used to connect with patients for Virtual Visits (Telemedicine).  Patients are able to view lab/test results, encounter notes, upcoming appointments, etc.  Non-urgent messages can be sent to your provider as well.   To learn more about what you can do with MyChart, go to [email or web address].    Your next appointment:   1 month(s)  Provider:   Eula Listen, PA-C    Other Instructions Pulse ox from [facility], [facility], [facility]

## 2022-08-14 NOTE — Telephone Encounter (Signed)
Pt c/o medication issue:  1. Name of Medication:   potassium chloride SA (KLOR-CON M) 20 MEQ tablet    2. How are you currently taking this medication (dosage and times per day)? As written   3. Are you having a reaction (difficulty breathing--STAT)? No   4. What is your medication issue? Pt spouse called in stating this medication is supposed to be a capsule not a tablet. He states they need a new rx sent and they would like a c/b to confirm its been sent.

## 2022-08-14 NOTE — Progress Notes (Signed)
Cardiology Office Note    Date:  08/14/2022   ID:  Faylee, Sellers 07-24-1955, MRN [id number]  PCP:  Jerl Mina, MD  Cardiologist:  Yvonne Kendall, MD  Electrophysiologist:  None   Chief Complaint: Hospital follow-up  History of Present Illness:   Kaylee Pope is a 67 y.o. 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  In this setting, she underwent preoperative cardiac risk stratification due to exertional substernal and left-sided chest discomfort.  Echo performed at [facility] on 07/10/2022 showed an EF greater than 55%, normal RV systolic function and ventricular cavity size, and mild aortic insufficiency.  Carotid artery ultrasound at that time showed less than 50% bilateral ICA stenoses.  She underwent coronary CTA on 07/12/2022 which demonstrated a calcium score of 1361 which was the 99th percentile.  There was significant dilatation of the descending thoracic aorta measuring 56 mm in diameter with severe mural atheroma.  The RCA was dominant with a CTO of the proximal to mid segment, 50 to 69% proximal LAD stenosis, and 25 to 49% proximal nondominant LCx stenosis.  CT FFR was significant in the mid LAD and LCx.  LHC on 07/23/2022 showed severe three-vessel CAD including sequential 70 to 90% proximal and mid LAD stenoses with aneurysmal segment at the takeoff of a large D1 branch, 70 to 80% proximal/mid LCx stenosis and CTO of the mid RCA with left to right collaterals.  Mildly elevated LVEDP estimated at 22 mmHg.  She was subsequently evaluated by CVTS and underwent four-vessel CABG on 07/27/2022.  Post cath course was largely  uncomplicated with expected postoperative volume overload and blood loss anemia.  She was diuresed.  She did have hypoxia with oxygen saturations in the mid 80s with ambulation and was discharged on supplemental oxygen.  She comes in accompanied by her daughter today and is doing well from a cardiac perspective.  She is without symptoms of angina or cardiac decompensation.  Over the past 2 days, she has noted an increase in shortness of breath compared to how she was feeling when she was discharged.  She does continue to utilize her supplemental oxygen on an as needed basis.  Her weight is up 6 pounds by our scale today when compared to her last visit on 07/19/2022.  She did note vigorous urine output while on furosemide for a week after her hospital discharge.  She does feel like there is some abdominal distention.  No lower extremity swelling.  Continues to sleep on a wedge pillow following bypass surgery.  No falls or symptoms concerning for bleeding.  No dizziness, presyncope, or syncope.  Adherent to aspirin 325 mg daily.  Currently scheduled to undergo endovascular repair of her thoracoabdominal aortic aneurysm on 08/27/2022.   Labs independently reviewed: 07/2022 - potassium 3.8, BUN 18, serum creatinine 0.72, Hgb 7.5, PLT 139, magnesium 2.3, A1c 5.9, LP(a) 307.5 05/2022 - TC 150, TG 138, HDL 44, LDL 78, albumin 4.2, AST/ALT normal 07/2021 - TSH normal  Past Medical History:  Diagnosis Date   Allergy    CAD (coronary artery disease)    a. s/p 4-V CABG (LIMA-LAD, VG-diag, VG-OM, VG-PDA) 07/2022  Hyperlipidemia    Hypertension    Thoracoabdominal aortic aneurysm (HCC)    Thyroid disease     Past Surgical History:  Procedure Laterality Date   CORONARY ARTERY BYPASS GRAFT N/A 07/27/2022   Procedure: CORONARY ARTERY BYPASS GRAFTING (CABG) X4 USING LEFT INTERNAL MAMMARY ARTERY AND BILATERAL ENDOSCOPIC HARVESTED GREATER SAPHENOUS VEINS;  Surgeon: Alleen Borne, MD;  Location: [facility] OR;  Service:  Open Heart Surgery;  Laterality: N/A;   LEFT HEART CATH AND CORONARY ANGIOGRAPHY N/A 07/23/2022   Procedure: LEFT HEART CATH AND CORONARY ANGIOGRAPHY;  Surgeon: Yvonne Kendall, MD;  Location: [facility] INVASIVE CV LAB;  Service: Cardiovascular;  Laterality: N/A;   TEE WITHOUT CARDIOVERSION N/A 07/27/2022   Procedure: TRANSESOPHAGEAL ECHOCARDIOGRAM;  Surgeon: Alleen Borne, MD;  Location: [facility];  Service: Open Heart Surgery;  Laterality: N/A;   TONSILLECTOMY     age 76    Current Medications: Current Meds  Medication Sig   amLODipine (NORVASC) 5 MG tablet Take 1 tablet (5 mg total) by mouth daily.   aspirin EC 325 MG tablet Take 1 tablet (325 mg total) by mouth daily.   carvedilol (COREG) 6.25 MG tablet Take 6.25 mg by mouth 2 (two) times daily with a meal.   Fe Fum-Vit C-Vit B12-FA (TRIGELS-F FORTE) CAPS capsule Take 1 capsule by mouth daily after breakfast.   fluticasone (FLONASE) 50 MCG/ACT nasal spray Place 2 sprays into both nostrils daily as needed for allergies.   Ibuprofen-Acetaminophen (ACETAMINOPHEN-IBUPROFEN PO) Take by mouth as needed.   levothyroxine (SYNTHROID, LEVOTHROID) 75 MCG tablet Take 1 tablet (75 mcg total) by mouth daily.   OMEPRAZOLE PO Take 1 tablet by mouth daily.   rosuvastatin (CRESTOR) 20 MG tablet Take 1 tablet (20 mg total) by mouth daily.   venlafaxine XR (EFFEXOR-XR) 150 MG 24 hr capsule Take 1 capsule (150 mg total) by mouth daily.   [DISCONTINUED] furosemide (LASIX) 40 MG tablet Take 1 tablet (40 mg total) by mouth daily.   [DISCONTINUED] potassium chloride SA (KLOR-CON M) 20 MEQ tablet Take 1 tablet (20 mEq total) by mouth once for 1 dose.    Allergies:   Patient has no known allergies.   Social History   Socioeconomic History   Marital status: Married    Spouse name: Not on file   Number of children: 2   Years of education: Not on file   Highest education level: Not on file  Occupational History   Not on file  Tobacco Use   Smoking status: Former     Packs/day: 0.25    Years: 50.00    Additional pack years: 0.00    Total pack years: 12.50    Types: Cigarettes    Passive exposure: Current   Smokeless tobacco: Never   Tobacco comments:    Stopped smoking 4/46/2024  Vaping Use   Vaping Use: Never used  Substance and Sexual Activity   Alcohol use: Not Currently   Drug use: Never   Sexual activity: Not on file  Other Topics Concern   Not on file  Social History Narrative   Engineer, materials at [employer]   One grandson   Social Determinants of Health   Financial Resource Strain: Not on file  Food Insecurity: Not on file  Transportation Needs: Not on file  Physical Activity: Not on file  Stress: Not on file  Social Connections: Not on file     Family History:  The patient's family history includes Breast cancer (age of onset: 43)  in her maternal aunt; COPD in her mother; Cancer in her maternal aunt; Diabetes in her sister; Heart Problems in her maternal grandmother; Heart attack in her father; Stroke in her father.  ROS:   12-point review of systems is negative unless otherwise noted in the HPI.   EKGs/Labs/Other Studies Reviewed:    Studies reviewed were summarized above. The additional studies were reviewed today:   2D echo 07/23/2022: 1. Left ventricular ejection fraction, by estimation, is 55 to 60%. The  left ventricle has normal function. The left ventricle has no regional  wall motion abnormalities. There is mild concentric left ventricular  hypertrophy. Left ventricular diastolic  parameters are indeterminate.   2. Right ventricular systolic function is normal. The right ventricular  size is normal.   3. The mitral valve is normal in structure. Trivial mitral valve  regurgitation. No evidence of mitral stenosis.   4. The aortic valve is normal in structure. Aortic valve regurgitation is  not visualized. No aortic stenosis is present.   5. Abodminal aorta is dilated ( 3.3cm).   6. The inferior  vena cava is normal in size with greater than 50%  respiratory variability, suggesting right atrial pressure of 3 mmHg.   7. Evidence of atrial level shunting detected by color flow Doppler.  Small secundum ASD noted 1.20 cm.  __________  LHC/15/2024: Conclusions: Severe three-vessel coronary artery disease including sequential 70-90% proximal and mid LAD stenoses with aneurysmal segment at the takeoff of large first diagonal branch, 70-80% proximal/mid LCx stenosis, and chronic total occlusion of mid RCA with left-to-right collaterals. Mildly elevated left ventricular filling pressure (LVEDP 22 mmHg).  LVEF known to be normal based on recent echocardiogram at [facility].   Recommendations: Admit for antianginal therapy, as progressive atypical left-sided chest pain is most likely the patient's anginal equivalent, as well as cardiac surgery consultation for CABG. Aggressive secondary prevention of coronary artery disease. __________  Coronary CTA with CT FFR/07/2022: IMPRESSION: 1. Descending thoracic aortic aneurysm this should be followed up with CTA of the chest for complete evaluation. 2. Otherwise no significant extracardiac incidental findings were seen.  FINDINGS: Aorta: Severe dilation of descending thoracic aorta, 56 mm in diameter, with severe mural atheroma. No dissection.   Aortic Valve:  Trileaflet. mild calcifications.   Coronary Arteries:  Normal coronary origin.  Right dominance.   RCA is a dominant artery. There is calcified and non calcified plaque proximally causing total occlusion in the proximal-mid segment (100%).   Left main gives rise to LAD and LCX arteries. LM has no disease.   LAD has calcified plaque proximally causing moderate stenosis (50-69%).   LCX is a non-dominant artery. There is calcified plaque in the proximal segment causing mild stenosis (25-49%).   Other findings:   Normal pulmonary vein drainage into the left atrium.   Normal left atrial  appendage without a thrombus.   Normal size of the pulmonary artery.   IMPRESSION: 1. Coronary calcium score of 1361. This was 99th percentile for age and sex matched control. 2. Normal coronary origin with right dominance. 3. CTO of proximal to mid RCA. 4. Moderate proximal LAD stenosis (50-69%). 5. Mild proximal LCx stenosis (25-49%). 6. Known severe dilation of descending thoracic aorta, 56 mm in diameter, with severe mural atheroma. 7. CAD-RADS 5 Total coronary occlusion (100%). Consider cardiac catheterization or viability assessment. Consider symptom-guided anti-ischemic pharmacotherapy as well as risk factor modification per guideline directed care. 8. Will submit CT FFR to analyze proximal LAD. Will be reported  separately.  1. Left Main:  No significant stenosis.   2. LAD: significant stenosis in the mid LAD.  FFRct 0.71 3. LCX: significant stenosis in the mid LCx.  FFRct 0.77 4. RCA: Total occlusion of mid RCA.   IMPRESSION: 1. CT FFR analysis showed significant stenosis in the mid LAD and LCx. 2.  Total occlusion of mid RCA. 3.  Cardiac catheterization recommended.    EKG:  EKG is ordered today.  The EKG ordered today demonstrates NSR, 75 bpm, possible anteroseptal infarct, poor R wave progression along the precordial leads, nonspecific ST-T changes, consistent with prior tracing  Recent Labs: 07/30/2022: Magnesium 2.3 08/14/2022: BUN 15; Creatinine, Ser 0.72; Hemoglobin 9.3; Platelets 261; Potassium 4.0; Sodium 139  Recent Lipid Panel    Component Value Date/Time   CHOL 236 (H) 10/20/2014 0845   TRIG 209.0 (H) 10/20/2014 0845   HDL 36.80 (L) 10/20/2014 0845   CHOLHDL 6 10/20/2014 0845   VLDL 41.8 (H) 10/20/2014 0845   LDLCALC 106 (H) 04/29/2012 0820   LDLDIRECT 161.0 10/20/2014 0845    PHYSICAL EXAM:    VS:  BP 116/72 (BP Location: Left Arm, Patient Position: Sitting, Cuff Size: Normal)   Pulse 75   Ht 5' (1.524 m)   Wt 171 lb (77.6 kg)   SpO2 94%    BMI 33.40 kg/m   BMI: Body mass index is 33.4 kg/m.  Physical Exam Vitals reviewed.  Constitutional:      Appearance: She is well-developed.  HENT:     Head: Normocephalic and atraumatic.  Eyes:     General:        Right eye: No discharge.        Left eye: No discharge.  Neck:     Vascular: No JVD.  Cardiovascular:     Rate and Rhythm: Normal rate and regular rhythm.     Heart sounds: Normal heart sounds, S1 normal and S2 normal. Heart sounds not distant. No midsystolic click and no opening snap. No murmur heard.    No friction rub.  Pulmonary:     Effort: Pulmonary effort is normal. No respiratory distress.     Breath sounds: Examination of the right-lower field reveals decreased breath sounds. Examination of the left-lower field reveals decreased breath sounds. Decreased breath sounds present. No wheezing or rales.     Comments: Mildly diminished breath sounds along the bilateral bases with faint crackles. Chest:     Chest wall: No tenderness.  Abdominal:     General: There is no distension.     Palpations: Abdomen is soft.  Musculoskeletal:     Cervical back: Normal range of motion.     Right lower leg: No edema.     Left lower leg: No edema.  Skin:    General: Skin is warm and dry.     Nails: There is no clubbing.  Neurological:     Mental Status: She is alert and oriented to person, place, and time.  Psychiatric:        Speech: Speech normal.        Behavior: Behavior normal.        Thought Content: Thought content normal.        Judgment: Judgment normal.     Wt Readings from Last 3 Encounters:  08/14/22 171 lb (77.6 kg)  08/02/22 171 lb 8 oz (77.8 kg)  07/19/22 165 lb 6 oz (75 kg)     ASSESSMENT & PLAN:   CAD status post CABG without angina:  She is doing well following surgical revascularization and is without symptoms of angina.  She remains on aspirin 325 mg daily along with amlodipine, carvedilol, and rosuvastatin.  She is mildly volume up today as  outlined below.  We will contact her cardiothoracic surgeon's office to see if her appointment can be moved up given the patient is currently scheduled for endovascular repair of enlarging thoracoabdominal aortic aneurysm on 08/27/2022.  Aggressive risk factor modification and secondary prevention.  Follow restrictions under the direction of CVTS.  Volume overload with hypoxemia: She is mildly volume up with a weight that is up 6 pounds today when compared to her prior clinic visit on 07/19/2022.  She also notes an increase in shortness of breath.  Add furosemide 40 mg daily with KCl 20 mEq daily.  Check BMP today and again in 1 week to help gauge ongoing diuresis.  Pulse ox 94% at rest.  Suspect she will continue to need supplemental oxygen in the short-term, particularly with ambulation and nocturnally.  Prescription provided for pulse oximeter.  Follow-up with CVTS.  Thoracoabdominal aortic aneurysm: Followed by [facility].  Scheduled for endovascular repair on 08/27/2022.  Patient has been surgically revascularized and is without symptoms of angina or cardiac decompensation.  HTN: Blood pressure is well-controlled in the office today.  She remains on amlodipine 5 mg and carvedilol 6.25 mg twice daily.  HLD: LDL 78.  LP(a) 307.  She remains on rosuvastatin 20 mg daily.  We did introduce the idea of adding/transitioning to PCSK9 inhibitor given elevated LP(a).  We will revisit this in follow-up.  Target LDL less than 55.  Acute blood loss anemia: Remains on iron supplementation.  Check CBC.    Disposition: F/u with Dr. Okey Dupre or an APP in 1 month.   Medication Adjustments/Labs and Tests Ordered: Current medicines are reviewed at length with the patient today.  Concerns regarding medicines are outlined above. Medication changes, Labs and Tests ordered today are summarized above and listed in the Patient Instructions accessible in Encounters.   Signed, Eula Listen, PA-C 08/14/2022 5:08 PM     [facility] [address] [place], [place] [address] [phone]

## 2022-08-14 NOTE — Telephone Encounter (Signed)
Pt c/o medication issue:  1. Name of Medication: potassium chloride SA (KLOR-CON M) 20 MEQ tablet   2. How are you currently taking this medication (dosage and times per day)? Take 1 tablet (20 mEq total) by mouth daily.   3. Are you having a reaction (difficulty breathing--STAT)? No  4. What is your medication issue? Patient's daughter is calling because they went to pick up their refill at [facility] [place]. Patient's daughter noticed that the medication was sent as tablets instead of capsules. Patient's daughter stated that the patient needs capsules instead of tablets because it is easier for the patient to take. Please advise.

## 2022-08-14 NOTE — Telephone Encounter (Signed)
Spoke with patients husband to review we sent in another prescription. He was very upset and said that it was unacceptable that she has to take the 90 days worth before getting the capsules. We sent in new prescription for the capsules to the pharmacy but unclear if capsules. Called pharmacy and she states that the potassium chloride capsules 10 mEq is the capsule dose. He abruptly hung up phone.

## 2022-08-15 ENCOUNTER — Other Ambulatory Visit: Payer: Self-pay

## 2022-08-15 MED ORDER — POTASSIUM CHLORIDE ER 10 MEQ PO CPCR: 20.0000 meq | ORAL_CAPSULE | Freq: Two times a day (BID) | ORAL | 3 refills | Status: DC

## 2022-08-15 NOTE — Telephone Encounter (Signed)
Spoke with the patient and informed her that the correct Rx for the potassium capsules had been ordered and sent to the pharmacy of choice. Informed patient that since it is 10 mEq she is to take 2 capsules daily to equal the order of 20 mEq daily. Patient understood with read back.

## 2022-08-15 NOTE — Telephone Encounter (Signed)
It doesn't come in a capsule, only a capsule. The dose is either a tablet, oral solution, or packet for reconstitution.

## 2022-08-16 ENCOUNTER — Telehealth: Payer: Self-pay | Admitting: Physician Assistant

## 2022-08-16 MED ORDER — POTASSIUM CHLORIDE ER 10 MEQ PO CPCR: 20.0000 meq | ORAL_CAPSULE | Freq: Every day | ORAL | 3 refills | Status: DC

## 2022-08-16 NOTE — Telephone Encounter (Signed)
My chart message recevied from the patient today inquiring about her potassium dosing.    She was seen by Eula Listen, PA on 08/14/22 and the recommendation was to: 1) START - furosemide (LASIX) 40 MG tablet - Take 1 tablet (40 mg total) by mouth daily 2) START - potassium chloride SA (KLOR-CON M) 20 MEQ tablet - Take 1 tablet (20 mEq total) by mouth daily.  The patient had contacted the office after her appointment on 5/7 requesting that potassium be in capsule form vs tablet form.   A RX was sent to the pharmacy for: potassium chloride (MICRO-K) 10 MEQ CR capsule 180 capsule 3 08/15/2022    Sig - Route: Take 2 capsules (20 mEq total) by mouth 2 (two) times daily. - Oral   Sent to pharmacy as: potassium chloride (MICRO-K) 10 MEQ CR capsule   E-Prescribing Status: Receipt confirmed by pharmacy (08/15/2022  9:59 AM EDT)    The patient was inquiring today what she should be taking for her potassium dosing.   I have advised the patient that the RX for potassium had the correct dose, but incorrect sig. I have advised her to take Potassium 10 meq: 2 capsules (20 meq) by mouth once daily. I also advised her I would contact the pharmacy to change the sig for future refills.  She has been advised to obtain her repeat BMP in 1 week to recheck her potassium levels as directed by Eula Listen, PA at her 08/14/22 office visit.   I have contacted the pharmacy [facility] & left a voice mail to please change the patient's potassium sig to state: Potassium 10 meq: take 2 capsules (20 meq) by mouth ONCE daily. I asked that they call back with any further questions/ concerns.

## 2022-08-21 ENCOUNTER — Other Ambulatory Visit
Admission: RE | Admit: 2022-08-21 | Discharge: 2022-08-21 | Disposition: A | Discharge status: Home / Self Care | Payer: Medicare HMO | Source: Ambulatory Visit | Attending: Physician Assistant | Admitting: Physician Assistant

## 2022-08-21 ENCOUNTER — Telehealth: Payer: Self-pay | Admitting: *Deleted

## 2022-08-21 ENCOUNTER — Other Ambulatory Visit: Payer: Self-pay | Admitting: Surgery

## 2022-08-21 DIAGNOSIS — Z951 Presence of aortocoronary bypass graft: Secondary | ICD-10-CM | POA: Diagnosis present

## 2022-08-21 DIAGNOSIS — E782 Mixed hyperlipidemia: Secondary | ICD-10-CM | POA: Diagnosis present

## 2022-08-21 DIAGNOSIS — I2510 Atherosclerotic heart disease of native coronary artery without angina pectoris: Secondary | ICD-10-CM | POA: Diagnosis present

## 2022-08-21 DIAGNOSIS — Z79899 Other long term (current) drug therapy: Secondary | ICD-10-CM

## 2022-08-21 LAB — BASIC METABOLIC PANEL
Anion gap: 7 (ref 5–15)
BUN: 16 mg/dL (ref 8–23)
CO2: 32 mmol/L (ref 22–32)
Calcium: 9 mg/dL (ref 8.9–10.3)
Chloride: 101 mmol/L (ref 98–111)
Creatinine, Ser: 0.89 mg/dL (ref 0.44–1.00)
GFR, Estimated: >60 mL/min (ref >60)
Glucose, Bld: 92 mg/dL (ref 70–99)
Potassium: 3.8 mmol/L (ref 3.5–5.1)
Sodium: 140 mmol/L (ref 135–145)

## 2022-08-21 NOTE — Telephone Encounter (Signed)
No answer. No voicemail. 

## 2022-08-21 NOTE — Telephone Encounter (Signed)
-----   Message from Sondra Barges, [place] sent at 08/21/2022  2:46 PM EDT ----- Please inform the patient her kidney function and potassium are normal.  She should continue current dose of furosemide 40 mg daily with KCl 20 mEq daily.  Since we are maintaining her on furosemide, recommend follow-up BMP in 7 days to ensure her renal function and potassium remain stable.  I do not want to wait for this to be rechecked at her visit next month.  Hopefully, we will be able to decrease furosemide at that time.

## 2022-08-22 ENCOUNTER — Ambulatory Visit (INDEPENDENT_AMBULATORY_CARE_PROVIDER_SITE_OTHER): Payer: Self-pay | Admitting: Surgery

## 2022-08-22 ENCOUNTER — Ambulatory Visit
Admission: RE | Admit: 2022-08-22 | Discharge: 2022-08-22 | Disposition: A | Discharge status: Home / Self Care | Payer: Medicare HMO | Source: Ambulatory Visit | Attending: Surgery | Admitting: Surgery

## 2022-08-22 ENCOUNTER — Encounter: Payer: Self-pay | Admitting: Surgery

## 2022-08-22 VITALS — BP 130/83 | HR 90 | Resp 20 | Wt 164.0 lb

## 2022-08-22 DIAGNOSIS — Z951 Presence of aortocoronary bypass graft: Secondary | ICD-10-CM

## 2022-08-22 NOTE — Telephone Encounter (Signed)
Pt made aware of lab results along with Ryan's recommendations Pt verbalized understanding Repeat lab orders placed

## 2022-08-22 NOTE — Progress Notes (Signed)
HPI: Patient returns for routine postoperative follow-up having undergone coronary bypass graft surgery x 4 on 07/27/2022. The patient's early postoperative recovery while in the hospital was notable for an uncomplicated but slow postoperative course due to heavy smoking and COPD.  She was discharged home on oxygen. Since hospital discharge the patient reports that she is gradually feeling better.  She was seen by cardiology on 08/14/2022 and reported some shortness of breath.  Her weight was 6 pounds over her baseline and she was started back on Lasix 40 mg daily.  She said that since then her breathing has continued to improve.  She had some lower extremity swelling but that is resolved.  She has a large thoracoabdominal aneurysm that is rescheduled to be repaired endovascularly by Dr. Pattricia Boss at [facility] on 08/27/2022.   Current Outpatient Medications  Medication Sig Dispense Refill   albuterol (VENTOLIN HFA) 108 (90 BASE) MCG/ACT inhaler Inhale 1-2 puffs into the lungs every 6 (six) hours as needed for wheezing or shortness of breath. 1 Inhaler 0   amLODipine (NORVASC) 5 MG tablet Take 1 tablet (5 mg total) by mouth daily. 30 tablet 2   aspirin EC 325 MG tablet Take 1 tablet (325 mg total) by mouth daily. 100 tablet 2   carvedilol (COREG) 6.25 MG tablet Take 6.25 mg by mouth 2 (two) times daily with a meal.     clobetasol (OLUX) 0.05 % topical foam Apply topically 2 (two) times daily. For up to 2 weeks as needed. Avoid applying to face, groin, and axilla. Use as directed. Long-term use can cause thinning of the skin. 50 g 2   Fe Fum-Vit C-Vit B12-FA (TRIGELS-F FORTE) CAPS capsule Take 1 capsule by mouth daily after breakfast. 30 capsule 0   fluticasone (FLONASE) 50 MCG/ACT nasal spray Place 2 sprays into both nostrils daily as needed for allergies.     furosemide (LASIX) 40 MG tablet Take 1 tablet (40 mg total) by mouth daily. 90 tablet 3   Ibuprofen-Acetaminophen (ACETAMINOPHEN-IBUPROFEN PO) Take by  mouth as needed.     levothyroxine (SYNTHROID, LEVOTHROID) 75 MCG tablet Take 1 tablet (75 mcg total) by mouth daily. 90 tablet 0   OMEPRAZOLE PO Take 1 tablet by mouth daily.     potassium chloride (MICRO-K) 10 MEQ CR capsule Take 2 capsules (20 mEq total) by mouth daily. 180 capsule 3   rosuvastatin (CRESTOR) 20 MG tablet Take 1 tablet (20 mg total) by mouth daily. 90 tablet 3   triamcinolone ointment (KENALOG) 0.1 % apply twice daily as needed to affected areas for two weeks then stop. Avoid applying to face, groin, and axilla. Use as directed. Long-term use can cause thinning of the skin. 60 g 0   venlafaxine XR (EFFEXOR-XR) 150 MG 24 hr capsule Take 1 capsule (150 mg total) by mouth daily. 90 capsule 0   acetaminophen (TYLENOL) 325 MG tablet Take 2 tablets (650 mg total) by mouth every 6 (six) hours as needed for mild pain or moderate pain. (Patient not taking: Reported on 08/14/2022)     furosemide (LASIX) 40 MG tablet Take 1 tablet (40 mg total) by mouth daily for 7 days. 7 tablet 0   varenicline (CHANTIX) 1 MG tablet Take 0.5 mg by mouth daily.     No current facility-administered medications for this visit.    Physical Exam: BP 130/83   Pulse 90   Resp 20   Wt 164 lb (74.4 kg)   SpO2 (!) 88% Comment: RA/  pt on O2- 1L per [place]  BMI 32.03 kg/m  She looks well.  She is still on oxygen. Cardiac exam shows regular rate and rhythm with normal heart sounds.  There is no murmur. Lungs are clear. The chest incision is healing well and the sternum is stable. Her right leg incision is healing well and there is no lower extremity edema.  Diagnostic Tests:  Narrative & Impression  CLINICAL DATA:  History of CABG   EXAM: CHEST - 2 VIEW   COMPARISON:  08/01/2022   FINDINGS: Prior median sternotomy. Mild cardiomegaly, which appears somewhat decreased from the prior exam. Small right and trace left pleural effusions, decreased on the left. No pneumothorax. No new focal pulmonary  opacity. No acute osseous abnormality.   IMPRESSION: 1. Small right and trace left pleural effusions, decreased on the left. 2. Mild cardiomegaly, somewhat decreased from the prior exam.     Electronically Signed   By: Wiliam Ke M.D.   On: 08/22/2022 14:10    Impression:  Overall I think she is making good progress following her surgery.  Her chest x-ray shows small bilateral pleural effusions but the left pleural effusion has significantly improved compared to her prior x-ray.  I would expect the small right pleural effusion to resolve on Lasix.  Her weight has decreased 7 pounds since she was restarted on Lasix by cardiology.  I encouraged her to continue using her incentive spirometer and to continue ambulating.  I asked her not to lift anything heavier than 10 pounds for 3 months postoperatively.  I told her that she could return to driving a car when she feels comfortable with that.  She is hesitant to proceed with her aneurysm repair on 08/27/2022 and feels like she needs more time to recover from her heart surgery.  She is going to discuss that with Dr. Pattricia Boss.  Plan:  She will continue to follow-up with cardiology and will return to see me in 1 month with a chest x-ray for follow-up.   Alleen Borne, MD [facility] [phone]

## 2022-08-22 NOTE — Telephone Encounter (Signed)
Pt was returning nurse call regarding results and would like a callback on [phone] but stated she has an appt this afternoon so try her cell [phone] if she doesn't answer the first number. Please advise

## 2022-08-25 ENCOUNTER — Other Ambulatory Visit (HOSPITAL_COMMUNITY): Payer: Self-pay

## 2022-08-28 ENCOUNTER — Other Ambulatory Visit
Admission: RE | Admit: 2022-08-28 | Discharge: 2022-08-28 | Disposition: A | Discharge status: Home / Self Care | Payer: Medicare HMO | Attending: Physician Assistant | Admitting: Physician Assistant

## 2022-08-28 ENCOUNTER — Telehealth: Payer: Self-pay | Admitting: *Deleted

## 2022-08-28 DIAGNOSIS — Z79899 Other long term (current) drug therapy: Secondary | ICD-10-CM | POA: Insufficient documentation

## 2022-08-28 LAB — BASIC METABOLIC PANEL
Anion gap: 11 (ref 5–15)
BUN: 18 mg/dL (ref 8–23)
CO2: 30 mmol/L (ref 22–32)
Calcium: 8.8 mg/dL — ABNORMAL LOW (ref 8.9–10.3)
Chloride: 98 mmol/L (ref 98–111)
Creatinine, Ser: 1.04 mg/dL — ABNORMAL HIGH (ref 0.44–1.00)
GFR, Estimated: 59 mL/min — ABNORMAL LOW (ref >60)
Glucose, Bld: 110 mg/dL — ABNORMAL HIGH (ref 70–99)
Potassium: 3.5 mmol/L (ref 3.5–5.1)
Sodium: 139 mmol/L (ref 135–145)

## 2022-08-28 MED ORDER — FUROSEMIDE 40 MG PO TABS: 40.0000 mg | ORAL_TABLET | Freq: Every day | ORAL | 3 refills | Status: DC | PRN

## 2022-08-28 MED ORDER — POTASSIUM CHLORIDE ER 10 MEQ PO CPCR: 20.0000 meq | ORAL_CAPSULE | Freq: Every day | ORAL | 3 refills | Status: DC | PRN

## 2022-08-28 NOTE — Telephone Encounter (Signed)
-----   Message from Sondra Barges, [place] sent at 08/28/2022 12:55 PM EDT ----- Renal function is starting to trend a little bit indicating some dehydration.  Stop daily furosemide and KCl.  Take furosemide 40 mg and KCl 20 mEq daily as needed for increased shortness of breath or weight gain greater than 3 pounds overnight.

## 2022-08-28 NOTE — Telephone Encounter (Signed)
Spoke with patient and reviewed results and recommendations. She verbalized understanding, read back instructions, and had no further questions at this time.

## 2022-08-29 ENCOUNTER — Ambulatory Visit: Payer: Medicare HMO | Admitting: Surgery

## 2022-08-30 ENCOUNTER — Other Ambulatory Visit: Payer: Self-pay

## 2022-08-30 MED ORDER — FE FUM-VIT C-VIT B12-FA 460-60-0.01-1 MG PO CAPS: 1.0000 | ORAL_CAPSULE | Freq: Every day | ORAL | 0 refills | Status: DC

## 2022-09-07 ENCOUNTER — Other Ambulatory Visit (HOSPITAL_COMMUNITY): Payer: Self-pay

## 2022-09-07 ENCOUNTER — Encounter: Payer: Self-pay | Admitting: Surgery

## 2022-09-19 ENCOUNTER — Ambulatory Visit: Payer: Medicare HMO | Admitting: Surgery

## 2022-09-19 ENCOUNTER — Other Ambulatory Visit: Payer: Self-pay | Admitting: Surgery

## 2022-09-19 DIAGNOSIS — Z951 Presence of aortocoronary bypass graft: Secondary | ICD-10-CM

## 2022-09-20 ENCOUNTER — Encounter: Payer: Self-pay | Admitting: Surgery

## 2022-09-20 ENCOUNTER — Ambulatory Visit: Payer: Medicare HMO | Admitting: Surgery

## 2022-09-20 ENCOUNTER — Ambulatory Visit (INDEPENDENT_AMBULATORY_CARE_PROVIDER_SITE_OTHER): Payer: Self-pay | Admitting: Surgery

## 2022-09-20 ENCOUNTER — Ambulatory Visit
Admission: RE | Admit: 2022-09-20 | Discharge: 2022-09-20 | Disposition: A | Discharge status: Home / Self Care | Payer: Medicare HMO | Source: Ambulatory Visit | Attending: Surgery | Admitting: Surgery

## 2022-09-20 VITALS — BP 134/83 | HR 86 | Resp 20 | Ht 60.0 in | Wt 163.0 lb

## 2022-09-20 DIAGNOSIS — Z951 Presence of aortocoronary bypass graft: Secondary | ICD-10-CM

## 2022-09-20 NOTE — Progress Notes (Signed)
Cardiology Office Note    Date:  09/21/2022   ID:  Mayle, Beckwith 1955-08-08, MRN [id number]  PCP:  Jerl Mina, MD  Cardiologist:  Yvonne Kendall, MD  Electrophysiologist:  None   Chief Complaint: Follow up  History of Present Illness:   Kaylee Pope is a 67 y.o. 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  In this setting, she underwent preoperative cardiac risk stratification due to exertional substernal and left-sided chest discomfort.  Echo performed at [facility] on 07/10/2022 showed an EF greater than 55%, normal RV systolic function and ventricular cavity size, and mild aortic insufficiency.  Carotid artery ultrasound at that time showed less than 50% bilateral ICA stenoses.  She underwent coronary CTA on 07/12/2022 which demonstrated a calcium score of 1361 which was the 99th percentile.  There was significant dilatation of the descending thoracic aorta measuring 56 mm in diameter with severe mural atheroma.  The RCA was dominant with a CTO of the proximal to mid segment, 50 to 69% proximal LAD stenosis, and 25 to 49% proximal nondominant LCx stenosis.  CT FFR was significant in the mid LAD and LCx.  LHC on 07/23/2022 showed severe three-vessel CAD including sequential 70 to 90% proximal and mid LAD stenoses with aneurysmal segment at the takeoff of a large D1 branch, 70 to 80% proximal/mid LCx stenosis and CTO of the mid RCA with left to right collaterals.  Mildly elevated LVEDP estimated at 22 mmHg.  She was subsequently evaluated by CVTS and underwent four-vessel CABG on 07/27/2022.  Post cath course was largely uncomplicated with expected  postoperative volume overload and blood loss anemia.  She was diuresed.  She did have hypoxia with oxygen saturations in the mid 80s with ambulation and was discharged on supplemental oxygen.  She was seen in hospital follow-up on 08/14/2022 and was without symptoms of angina or cardiac decompensation.  She did note a 2-day history of increased shortness of breath.  Her weight was up 6 pounds by our scale today when compared to her visit in our office on 07/19/2022.  She was started on furosemide 40 mg daily with KCl repletion.  She followed up with cardiothoracic surgery on 08/22/2022 noting improvement in her breathing and lower extremity swelling following the initiation of furosemide.  Chest x-ray at that time showed small bilateral pleural effusions with significant improvement in effusion when compared to prior study.  With regards to her thoracoabdominal aortic aneurysm, she followed up with her vascular surgeon at [facility] with plans for reevaluation by them later this month and consideration of rescheduling her vascular repair at that time.  She was evaluated by CVTS on 09/20/2022 and was improving.  She was no longer taking furosemide, noting stable weight and improvement in lower extremity swelling.  She was still using 1 L of supplemental oxygen at nighttime with oxygen saturations during the day in the mid 90s.  Chest x-ray showed no effusion, infiltrate, or pneumothorax with left midlung atelectasis noted.  She did complain of a burning sensation along her incision site and was advised to use Mederma  She comes today accompanied by her daughter and is doing well  from a cardiac perspective.  She is without symptoms of angina or cardiac decompensation.  No dyspnea, dizziness, presyncope, or syncope.  Her weight is down 8 pounds today when compared to her visit lower extremity swelling has resolved.  He has not been routinely checking blood pressure at home since her surgery.  Adherent to cardiac medications  without adverse effect.  Historically eats out at restaurants often.  Follows up with [facility] vascular surgery next month for further discussion of repair of her thoracoabdominal aortic aneurysm.  Continues to abstain from smoking.   Labs independently reviewed: 08/2022 - potassium 3.5, BUN 18, serum creatinine 1.04, Hgb 9.3, PLT 261 07/2022 - magnesium 2.3, A1c 5.9, LP(a) 307.5 05/2022 - TC 150, TG 138, HDL 44, LDL 78, albumin 4.2, AST/ALT normal 07/2021 - TSH normal  Past Medical History:  Diagnosis Date   Allergy    CAD (coronary artery disease)    a. s/p 4-V CABG (LIMA-LAD, VG-diag, VG-OM, VG-PDA) 07/2022   Hyperlipidemia    Hypertension    Thoracoabdominal aortic aneurysm (HCC)    Thyroid disease     Past Surgical History:  Procedure Laterality Date   CORONARY ARTERY BYPASS GRAFT N/A 07/27/2022   Procedure: CORONARY ARTERY BYPASS GRAFTING (CABG) X4 USING LEFT INTERNAL MAMMARY ARTERY AND BILATERAL ENDOSCOPIC HARVESTED GREATER SAPHENOUS VEINS;  Surgeon: Alleen Borne, MD;  Location: [facility] OR;  Service: Open Heart Surgery;  Laterality: N/A;   LEFT HEART CATH AND CORONARY ANGIOGRAPHY N/A 07/23/2022   Procedure: LEFT HEART CATH AND CORONARY ANGIOGRAPHY;  Surgeon: Yvonne Kendall, MD;  Location: [facility] INVASIVE CV LAB;  Service: Cardiovascular;  Laterality: N/A;   TEE WITHOUT CARDIOVERSION N/A 07/27/2022   Procedure: TRANSESOPHAGEAL ECHOCARDIOGRAM;  Surgeon: Alleen Borne, MD;  Location: [facility] OR;  Service: Open Heart Surgery;  Laterality: N/A;   TONSILLECTOMY     age 34    Current Medications: Current Meds  Medication Sig   acetaminophen (TYLENOL) 325 MG tablet Take 2 tablets (650 mg total) by mouth every 6 (six) hours as needed for mild pain or moderate pain.   amLODipine (NORVASC) 5 MG tablet Take 1 tablet (5 mg total) by mouth daily.   aspirin EC 325 MG tablet Take 1 tablet (325 mg total) by mouth daily.   carvedilol (COREG) 12.5 MG tablet Take 1 tablet (12.5 mg total) by mouth 2 (two) times  daily.   Ibuprofen-Acetaminophen (ACETAMINOPHEN-IBUPROFEN PO) Take by mouth as needed.   levothyroxine (SYNTHROID, LEVOTHROID) 75 MCG tablet Take 1 tablet (75 mcg total) by mouth daily.   OMEPRAZOLE PO Take 1 tablet by mouth daily.   rosuvastatin (CRESTOR) 20 MG tablet Take 1 tablet (20 mg total) by mouth daily.   venlafaxine XR (EFFEXOR-XR) 150 MG 24 hr capsule Take 1 capsule (150 mg total) by mouth daily.   [DISCONTINUED] carvedilol (COREG) 6.25 MG tablet Take 6.25 mg by mouth 2 (two) times daily with a meal.    Allergies:   Patient has no known allergies.   Social History   Socioeconomic History   Marital status: Married    Spouse name: Not on file   Number of children: 2   Years of education: Not on file   Highest education level: Not on file  Occupational History   Not on file  Tobacco Use   Smoking status: Former    Packs/day: 0.25    Years: 50.00    Additional pack years: 0.00    Total pack years: 12.50    Types: Cigarettes  Passive exposure: Current   Smokeless tobacco: Never   Tobacco comments:    Stopped smoking 4/46/2024  Vaping Use   Vaping Use: Never used  Substance and Sexual Activity   Alcohol use: Not Currently   Drug use: Never   Sexual activity: Not on file  Other Topics Concern   Not on file  Social History Narrative   Engineer, materials at [employer]   One grandson   Social Determinants of Health   Financial Resource Strain: Not on file  Food Insecurity: Not on file  Transportation Needs: Not on file  Physical Activity: Not on file  Stress: Not on file  Social Connections: Not on file     Family History:  The patient's family history includes Breast cancer (age of onset: 41) in her maternal aunt; COPD in her mother; Cancer in her maternal aunt; Diabetes in her sister; Heart Problems in her maternal grandmother; Heart attack in her father; Stroke in her father.  ROS:   12-point review of systems is negative unless otherwise noted in  the HPI.   EKGs/Labs/Other Studies Reviewed:    Studies reviewed were summarized above. The additional studies were reviewed today:  2D echo 07/23/2022: 1. Left ventricular ejection fraction, by estimation, is 55 to 60%. The  left ventricle has normal function. The left ventricle has no regional  wall motion abnormalities. There is mild concentric left ventricular  hypertrophy. Left ventricular diastolic  parameters are indeterminate.   2. Right ventricular systolic function is normal. The right ventricular  size is normal.   3. The mitral valve is normal in structure. Trivial mitral valve  regurgitation. No evidence of mitral stenosis.   4. The aortic valve is normal in structure. Aortic valve regurgitation is  not visualized. No aortic stenosis is present.   5. Abodminal aorta is dilated ( 3.3cm).   6. The inferior vena cava is normal in size with greater than 50%  respiratory variability, suggesting right atrial pressure of 3 mmHg.   7. Evidence of atrial level shunting detected by color flow Doppler.  Small secundum ASD noted 1.20 cm.  __________   LHC/15/2024: Conclusions: Severe three-vessel coronary artery disease including sequential 70-90% proximal and mid LAD stenoses with aneurysmal segment at the takeoff of large first diagonal branch, 70-80% proximal/mid LCx stenosis, and chronic total occlusion of mid RCA with left-to-right collaterals. Mildly elevated left ventricular filling pressure (LVEDP 22 mmHg).  LVEF known to be normal based on recent echocardiogram at [facility].   Recommendations: Admit for antianginal therapy, as progressive atypical left-sided chest pain is most likely the patient's anginal equivalent, as well as cardiac surgery consultation for CABG. Aggressive secondary prevention of coronary artery disease. __________   Coronary CTA with CT FFR/07/2022: IMPRESSION: 1. Descending thoracic aortic aneurysm this should be followed up with CTA of the chest for  complete evaluation. 2. Otherwise no significant extracardiac incidental findings were seen.   FINDINGS: Aorta: Severe dilation of descending thoracic aorta, 56 mm in diameter, with severe mural atheroma. No dissection.   Aortic Valve:  Trileaflet. mild calcifications.   Coronary Arteries:  Normal coronary origin.  Right dominance.   RCA is a dominant artery. There is calcified and non calcified plaque proximally causing total occlusion in the proximal-mid segment (100%).   Left main gives rise to LAD and LCX arteries. LM has no disease.   LAD has calcified plaque proximally causing moderate stenosis (50-69%).   LCX is a non-dominant artery. There is calcified plaque in  the proximal segment causing mild stenosis (25-49%).   Other findings:   Normal pulmonary vein drainage into the left atrium.   Normal left atrial appendage without a thrombus.   Normal size of the pulmonary artery.   IMPRESSION: 1. Coronary calcium score of 1361. This was 99th percentile for age and sex matched control. 2. Normal coronary origin with right dominance. 3. CTO of proximal to mid RCA. 4. Moderate proximal LAD stenosis (50-69%). 5. Mild proximal LCx stenosis (25-49%). 6. Known severe dilation of descending thoracic aorta, 56 mm in diameter, with severe mural atheroma. 7. CAD-RADS 5 Total coronary occlusion (100%). Consider cardiac catheterization or viability assessment. Consider symptom-guided anti-ischemic pharmacotherapy as well as risk factor modification per guideline directed care. 8. Will submit CT FFR to analyze proximal LAD. Will be reported separately.   1. Left Main:  No significant stenosis.   2. LAD: significant stenosis in the mid LAD.  FFRct 0.71 3. LCX: significant stenosis in the mid LCx.  FFRct 0.77 4. RCA: Total occlusion of mid RCA.   IMPRESSION: 1. CT FFR analysis showed significant stenosis in the mid LAD and LCx. 2.  Total occlusion of mid RCA. 3.  Cardiac  catheterization recommended.   EKG:  EKG is ordered today.  The EKG ordered today demonstrates NSR, 73 bpm, left atrial enlargement, possible prior anterior infarct, nonspecific ST-T changes consistent with prior tracing  Recent Labs: 07/30/2022: Magnesium 2.3 09/21/2022: ALT 18; BUN 15; Creatinine, Ser 0.69; Hemoglobin 11.7; Platelets 174; Potassium 3.4; Sodium 143  Recent Lipid Panel    Component Value Date/Time   CHOL 236 (H) 10/20/2014 0845   TRIG 209.0 (H) 10/20/2014 0845   HDL 36.80 (L) 10/20/2014 0845   CHOLHDL 6 10/20/2014 0845   VLDL 41.8 (H) 10/20/2014 0845   LDLCALC 106 (H) 04/29/2012 0820   LDLDIRECT 161.0 10/20/2014 0845    PHYSICAL EXAM:    VS:  BP (!) 153/96   Pulse 73   Ht 5' (1.524 m)   Wt 163 lb 12.8 oz (74.3 kg)   SpO2 96%   BMI 31.99 kg/m   BMI: Body mass index is 31.99 kg/m.  Physical Exam Vitals reviewed.  Constitutional:      Appearance: She is well-developed.  HENT:     Head: Normocephalic and atraumatic.  Eyes:     General:        Right eye: No discharge.        Left eye: No discharge.  Neck:     Vascular: No JVD.  Cardiovascular:     Rate and Rhythm: Normal rate and regular rhythm.     Pulses:          Dorsalis pedis pulses are 2+ on the right side and 2+ on the left side.       Posterior tibial pulses are 2+ on the right side and 2+ on the left side.     Heart sounds: Normal heart sounds, S1 normal and S2 normal. Heart sounds not distant. No midsystolic click and no opening snap. No murmur heard.    No friction rub.  Pulmonary:     Effort: Pulmonary effort is normal. No respiratory distress.     Breath sounds: Normal breath sounds. No decreased breath sounds, wheezing or rales.  Chest:     Chest wall: No tenderness.  Abdominal:     General: There is no distension.  Musculoskeletal:     Cervical back: Normal range of motion.     Right lower leg:  No edema.     Left lower leg: No edema.  Skin:    General: Skin is warm and dry.      Nails: There is no clubbing.  Neurological:     Mental Status: She is alert and oriented to person, place, and time.  Psychiatric:        Speech: Speech normal.        Behavior: Behavior normal.        Thought Content: Thought content normal.        Judgment: Judgment normal.     Wt Readings from Last 3 Encounters:  09/21/22 163 lb 12.8 oz (74.3 kg)  09/20/22 163 lb (73.9 kg)  08/22/22 164 lb (74.4 kg)     ASSESSMENT & PLAN:   CAD status post CABG without angina: She is doing well and without symptoms concerning for angina following surgical revascularization.  She remains on aspirin 325 mg daily along with amlodipine, carvedilol, and rosuvastatin.  Recommended secondary prevention and aggressive risk factor modification.  She will begin participation with cardiac rehab following surgical intervention of her thoracoabdominal aortic aneurysm.  Restrictions per CVTS.  No indication for further ischemic testing at this time.  Check CBC.  Volume overload with hypoxemia: Volume status is much improved and she is euvolemic.  Oxygen saturation 96% on room air in the office today.  There remains some concern for nocturnal hypoxia, particularly in light of the patient's prior tobacco use with possible COPD.  We will plan for nocturnal pulse oximetry.  She remains on nocturnal oxygen at 1 L.  Wean as/if tolerated.  Check CMP.  Thoracoabdominal aortic aneurysm: Followed by [facility].  No cardiac contraindication to surgical repair.  HTN: Blood pressure is elevated in triage at 153/96 with repeat blood pressure 158/92.  Recommend she check blood pressure 1 and half to 2 hours after antihypertensive therapy.  Titrate carvedilol to 12.5 mg twice daily with continuation of amlodipine 5 mg daily.  HLD: LDL 78. LP(a) 307.  Remains on rosuvastatin 20 mg.  Target LDL less than 55.  Check lipid panel, LFT, and direct LDL with recommendation to escalate lipid-lowering therapy to achieve target  LDL.    Disposition: F/u with Dr. Okey Dupre or an APP in 2 months.   Medication Adjustments/Labs and Tests Ordered: Current medicines are reviewed at length with the patient today.  Concerns regarding medicines are outlined above. Medication changes, Labs and Tests ordered today are summarized above and listed in the Patient Instructions accessible in Encounters.   Signed, Eula Listen, PA-C 09/21/2022 4:21 PM     [facility] [place] [address] [place], [place] [address] [phone]

## 2022-09-20 NOTE — Progress Notes (Signed)
HPI:  The patient returns for follow-up status post coronary bypass surgery x 4 on 07/27/2022.The patient's early postoperative recovery while in the hospital was notable for an uncomplicated but slow postoperative course due to heavy smoking and COPD.  She was discharged home on oxygen.  Her husband is also on oxygen at home with ILD.  Since I last saw her on 08/22/2022 she has continued to improve.  She is no longer taking Lasix and her weight has been stable.  She has not noticed any swelling.  She feels like her breathing continues to improve.  She is still using oxygen at night at 1 L.  When she checks her oxygen saturations during the day they are in the mid 90s.  She still has some chest wall discomfort on the right and left side.  She is also concerned about the lower part of her scar between her breast which is thickened and is still burning.  Her daughter is with her today.  Current Outpatient Medications  Medication Sig Dispense Refill   acetaminophen (TYLENOL) 325 MG tablet Take 2 tablets (650 mg total) by mouth every 6 (six) hours as needed for mild pain or moderate pain.     amLODipine (NORVASC) 5 MG tablet Take 1 tablet (5 mg total) by mouth daily. 30 tablet 2   aspirin EC 325 MG tablet Take 1 tablet (325 mg total) by mouth daily. 100 tablet 2   carvedilol (COREG) 6.25 MG tablet Take 6.25 mg by mouth 2 (two) times daily with a meal.     Fe Fum-Vit C-Vit B12-FA (TRIGELS-F FORTE) CAPS capsule Take 1 capsule by mouth daily after breakfast. 90 capsule 0   Ibuprofen-Acetaminophen (ACETAMINOPHEN-IBUPROFEN PO) Take by mouth as needed.     levothyroxine (SYNTHROID, LEVOTHROID) 75 MCG tablet Take 1 tablet (75 mcg total) by mouth daily. 90 tablet 0   OMEPRAZOLE PO Take 1 tablet by mouth daily.     rosuvastatin (CRESTOR) 20 MG tablet Take 1 tablet (20 mg total) by mouth daily. 90 tablet 3   venlafaxine XR (EFFEXOR-XR) 150 MG 24 hr capsule Take 1 capsule (150 mg total) by mouth daily. 90  capsule 0   albuterol (VENTOLIN HFA) 108 (90 BASE) MCG/ACT inhaler Inhale 1-2 puffs into the lungs every 6 (six) hours as needed for wheezing or shortness of breath. (Patient not taking: Reported on 09/20/2022) 1 Inhaler 0   clobetasol (OLUX) 0.05 % topical foam Apply topically 2 (two) times daily. For up to 2 weeks as needed. Avoid applying to face, groin, and axilla. Use as directed. Long-term use can cause thinning of the skin. (Patient not taking: Reported on 09/20/2022) 50 g 2   fluticasone (FLONASE) 50 MCG/ACT nasal spray Place 2 sprays into both nostrils daily as needed for allergies. (Patient not taking: Reported on 09/20/2022)     furosemide (LASIX) 40 MG tablet Take 1 tablet (40 mg total) by mouth daily as needed (AS NEEDED for increased shortness of breath or weight gain greater than 3 pounds overnight.). (Patient not taking: Reported on 09/20/2022) 90 tablet 3   potassium chloride (MICRO-K) 10 MEQ CR capsule Take 2 capsules (20 mEq total) by mouth daily as needed (AS NEEDED when you take furosemide (Lasix)). (Patient not taking: Reported on 09/20/2022) 180 capsule 3   triamcinolone ointment (KENALOG) 0.1 % apply twice daily as needed to affected areas for two weeks then stop. Avoid applying to face, groin, and axilla. Use as directed. Long-term use can cause thinning  of the skin. (Patient not taking: Reported on 09/20/2022) 60 g 0   varenicline (CHANTIX) 1 MG tablet Take 0.5 mg by mouth daily.     No current facility-administered medications for this visit.     Physical Exam: BP 134/83   Pulse 86   Resp 20   Ht 5' (1.524 m)   Wt 163 lb (73.9 kg)   SpO2 93% Comment: RA  BMI 31.83 kg/m  She looks well. Cardiac exam shows a regular rate and rhythm with normal heart sounds.  There is no murmur. Lungs are clear. The chest incision is healing well.  There is a thickened scar at the lower end between her breasts which is pinker than the remaining scar with no signs of infection.  It looks  like a typical keloid. There is no peripheral edema.  Diagnostic Tests:  Narrative & Impression  CLINICAL DATA:  Status post CABG   EXAM: CHEST - 2 VIEW   COMPARISON:  None Available.   FINDINGS: Midline sternotomy overlies normal cardiac silhouette. Band of atelectasis in the LEFT mid lung. No effusion, infiltrate, pneumothorax. No acute osseous abnormality.   IMPRESSION: 1. LEFT mid lung atelectasis. 2. No acute findings.     Electronically Signed   By: Genevive Bi M.D.   On: 09/20/2022 15:31    Impression:  Overall I think she is making good progress at 2 months following her surgery.  I told her that she can gradually wean off the oxygen as tolerated.  She only needs oxygen saturations greater than 90% and she may certainly drop down into the high 80s at night while sleeping.  She has never had a sleep apnea test.  Her chest x-ray looks much better with resolution of her pleural effusions.  There is minimal left midlung atelectasis remaining.  I think her incision is fine.  She does have some thickening of the scar between her breast which is most likely related to increased tension in that area.  She can try Mederma to see if that helps.  I think the burning that she notices will gradually resolve.  Plan:  She is going to return to see Dr. 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  I think that can be fixed whenever she feels ready.  She has appointment with cardiology tomorrow.  She will return to see me if she has any problems with her incisions.   Alleen Borne, MD [facility] [phone]

## 2022-09-21 ENCOUNTER — Ambulatory Visit: Payer: Medicare HMO | Attending: Physician Assistant | Admitting: Physician Assistant

## 2022-09-21 ENCOUNTER — Other Ambulatory Visit
Admission: RE | Admit: 2022-09-21 | Discharge: 2022-09-21 | Disposition: A | Discharge status: Home / Self Care | Payer: Medicare HMO | Source: Ambulatory Visit | Attending: Physician Assistant | Admitting: Physician Assistant

## 2022-09-21 ENCOUNTER — Encounter: Payer: Self-pay | Admitting: Physician Assistant

## 2022-09-21 VITALS — BP 153/96 | HR 73 | Ht 60.0 in | Wt 163.8 lb

## 2022-09-21 DIAGNOSIS — I10 Essential (primary) hypertension: Secondary | ICD-10-CM

## 2022-09-21 DIAGNOSIS — Z951 Presence of aortocoronary bypass graft: Secondary | ICD-10-CM

## 2022-09-21 DIAGNOSIS — E782 Mixed hyperlipidemia: Secondary | ICD-10-CM

## 2022-09-21 DIAGNOSIS — I716 Thoracoabdominal aortic aneurysm, without rupture, unspecified: Secondary | ICD-10-CM | POA: Diagnosis not present

## 2022-09-21 DIAGNOSIS — I2510 Atherosclerotic heart disease of native coronary artery without angina pectoris: Secondary | ICD-10-CM | POA: Insufficient documentation

## 2022-09-21 DIAGNOSIS — E785 Hyperlipidemia, unspecified: Secondary | ICD-10-CM

## 2022-09-21 DIAGNOSIS — R0902 Hypoxemia: Secondary | ICD-10-CM | POA: Diagnosis not present

## 2022-09-21 DIAGNOSIS — Z79899 Other long term (current) drug therapy: Secondary | ICD-10-CM | POA: Insufficient documentation

## 2022-09-21 LAB — COMPREHENSIVE METABOLIC PANEL
ALT: 18 U/L (ref 0–44)
AST: 18 U/L (ref 15–41)
Albumin: 4 g/dL (ref 3.5–5.0)
Alkaline Phosphatase: 85 U/L (ref 38–126)
Anion gap: 10 (ref 5–15)
BUN: 15 mg/dL (ref 8–23)
CO2: 29 mmol/L (ref 22–32)
Calcium: 9.1 mg/dL (ref 8.9–10.3)
Chloride: 104 mmol/L (ref 98–111)
Creatinine, Ser: 0.69 mg/dL (ref 0.44–1.00)
GFR, Estimated: >60 mL/min (ref >60)
Glucose, Bld: 97 mg/dL (ref 70–99)
Potassium: 3.4 mmol/L — ABNORMAL LOW (ref 3.5–5.1)
Sodium: 143 mmol/L (ref 135–145)
Total Bilirubin: 0.3 mg/dL (ref 0.3–1.2)
Total Protein: 7.3 g/dL (ref 6.5–8.1)

## 2022-09-21 LAB — CBC
HCT: 38.5 % (ref 36.0–46.0)
Hemoglobin: 11.7 g/dL — ABNORMAL LOW (ref 12.0–15.0)
MCH: 28.6 pg (ref 26.0–34.0)
MCHC: 30.4 g/dL (ref 30.0–36.0)
MCV: 94.1 fL (ref 80.0–100.0)
Platelets: 174 10*3/uL (ref 150–400)
RBC: 4.09 MIL/uL (ref 3.87–5.11)
RDW: 14 % (ref 11.5–15.5)
WBC: 5.5 10*3/uL (ref 4.0–10.5)
nRBC: 0 % (ref 0.0–0.2)

## 2022-09-21 LAB — LIPID PANEL
Cholesterol: 130 mg/dL (ref 0–200)
HDL: 52 mg/dL (ref >40)
LDL Cholesterol: 56 mg/dL (ref 0–99)
Total CHOL/HDL Ratio: 2.5 RATIO
Triglycerides: 111 mg/dL (ref <150)
VLDL: 22 mg/dL (ref 0–40)

## 2022-09-21 LAB — LDL CHOLESTEROL, DIRECT: Direct LDL: 64 mg/dL (ref 0–99)

## 2022-09-21 MED ORDER — CARVEDILOL 12.5 MG PO TABS: 12.5000 mg | ORAL_TABLET | Freq: Two times a day (BID) | ORAL | 3 refills | Status: DC

## 2022-09-21 NOTE — Patient Instructions (Signed)
Medication Instructions:  Your physician has recommended you make the following change in your medication:   INCREASE Carvedilol 12.5 mg twice a day   *If you need a refill on your cardiac medications before your next appointment, please call your pharmacy*   Lab Work: CBC, CMET, Lipid, and direct LDL over at the [facility] mall entrance. Stop at registration desk to check in.   If you have labs (blood work) drawn today and your tests are completely normal, you will receive your results only by: MyChart Message (if you have MyChart) OR A paper copy in the mail If you have any lab test that is abnormal or we need to change your treatment, we will call you to review the results.   Testing/Procedures: Nocturnal oxygen test to be done. Someone will be in touch to get this scheduled.     Follow-Up: At [facility], you and your health needs are our priority.  As part of our continuing mission to provide you with exceptional heart care, we have created designated Provider Care Teams.  These Care Teams include your primary Cardiologist (physician) and Advanced Practice Providers (APPs -  Physician Assistants and Nurse Practitioners) who all work together to provide you with the care you need, when you need it.   Your next appointment:   2 month(s)  Provider:   Yvonne Kendall, MD or Eula Listen, PA-C

## 2022-09-28 ENCOUNTER — Other Ambulatory Visit: Payer: Self-pay | Admitting: Physician Assistant

## 2022-09-28 MED ORDER — AMLODIPINE BESYLATE 5 MG PO TABS: 7.5000 mg | ORAL_TABLET | Freq: Every day | ORAL | 2 refills | Status: DC

## 2022-09-28 NOTE — Progress Notes (Signed)
Please see MyChart message from today.  Amlodipine titrated to 7.5 mg daily.

## 2022-10-02 DIAGNOSIS — I2510 Atherosclerotic heart disease of native coronary artery without angina pectoris: Secondary | ICD-10-CM

## 2022-10-02 DIAGNOSIS — R0902 Hypoxemia: Secondary | ICD-10-CM

## 2022-10-02 DIAGNOSIS — R0600 Dyspnea, unspecified: Secondary | ICD-10-CM

## 2022-10-16 ENCOUNTER — Encounter: Payer: Self-pay | Admitting: Physician Assistant

## 2022-10-16 ENCOUNTER — Telehealth: Payer: Self-pay | Admitting: Physician Assistant

## 2022-10-16 NOTE — Telephone Encounter (Signed)
Please inform the patient her nocturnal oxygen study showed episodes of low oxygen levels and may qualify for nocturnal oxygen.  It would also be beneficial for her to be evaluated for ambulatory oxygen.  Please refer to pulmonology for evaluation nocturnal hypoxia for consideration of 6-minute walk test.  Please see scanned in nocturnal pulse ox study.

## 2022-10-17 NOTE — Telephone Encounter (Signed)
The patient had previously sent a MyChart message regarding her overnight oximetry results.   I have forwarded Eula Listen, PA's response/ findings for her results through MyChart.   Awaiting a response from the patient to see if she is agreeable with a Pulmonary referral.

## 2022-10-22 IMAGING — CT CT CHEST LUNG CANCER SCREENING LOW DOSE W/O CM
2 of 5 series · 15 of 40 positions shown, 18 images · non-contrast
Comparison: No priors.

CLINICAL DATA: 64-year-old female current smoker with 75 pack-year
history of smoking. Lung cancer screening examination.

EXAM:
CT CHEST WITHOUT CONTRAST LOW-DOSE FOR LUNG CANCER SCREENING
TECHNIQUE: Multidetector CT imaging of the chest was performed following the
standard protocol without IV contrast.

[Series 3: lung 1.00 · axial · 0.60mm/px · z∈[-1184,-907]mm · 12 of 305 slices shown, 15 images]
[im 14/305  mediastinal]
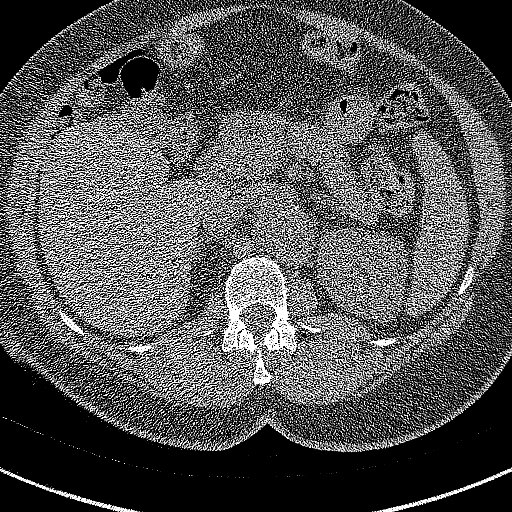
[im 14/305  lung]
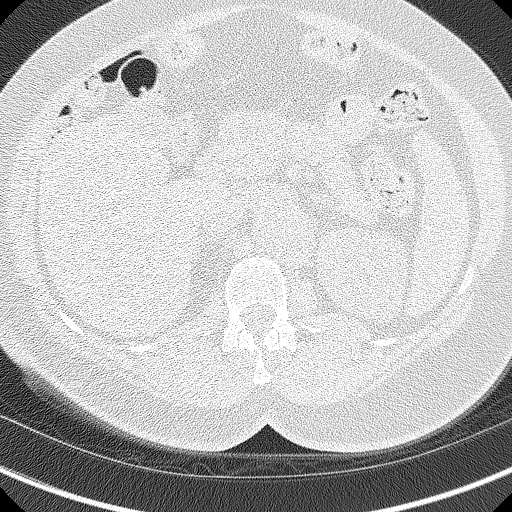
[im 42/305  lung]
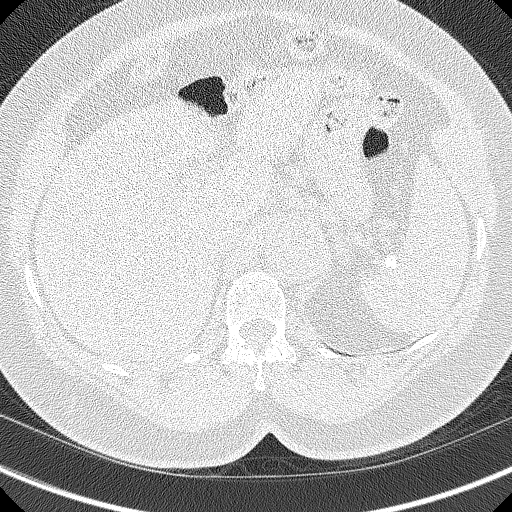
[im 70/305  lung]
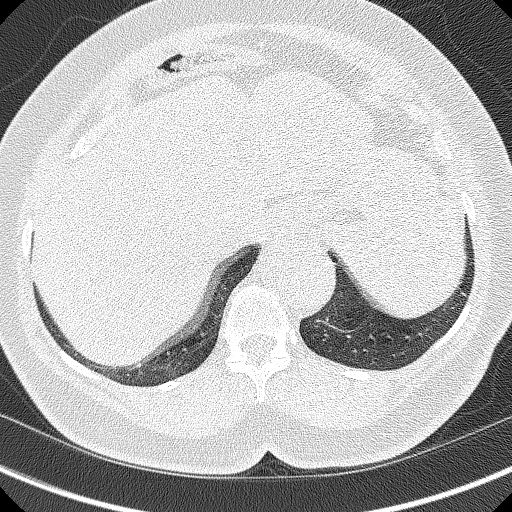
[im 97/305  lung]
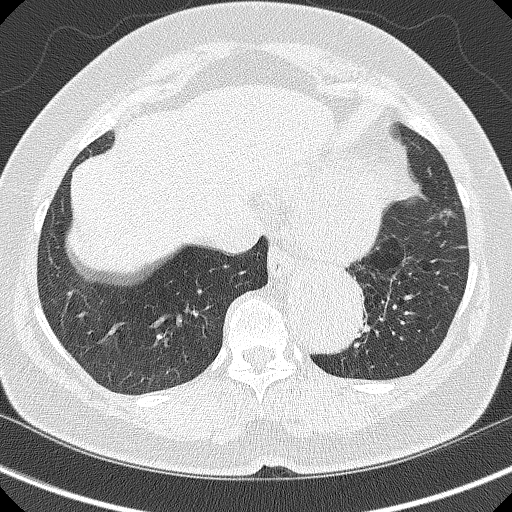
[im 111/305  mediastinal]
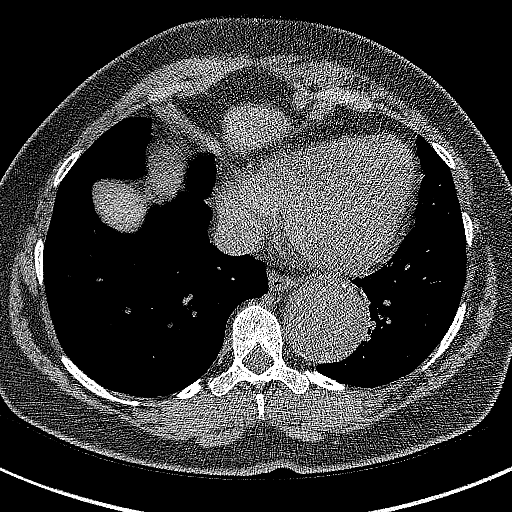
[im 111/305  lung]
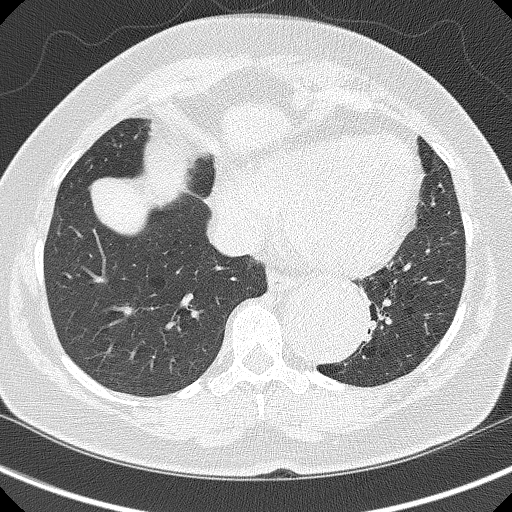
[im 139/305  lung]
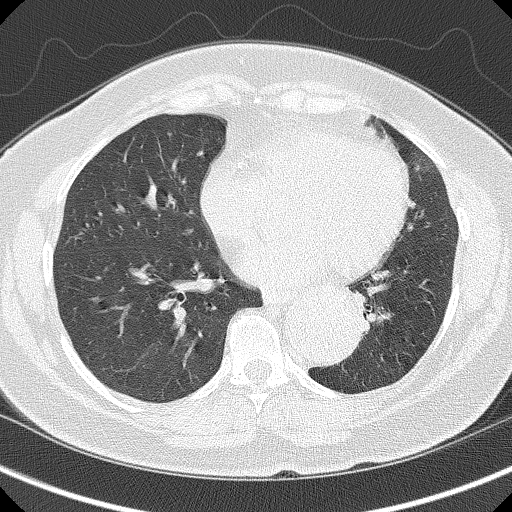
[im 166/305  lung]
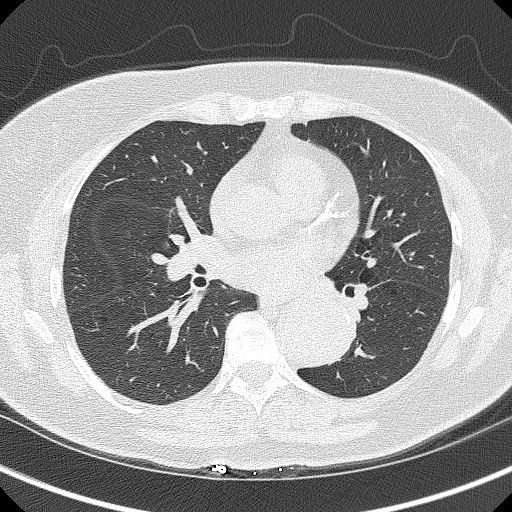
[im 194/305  lung]
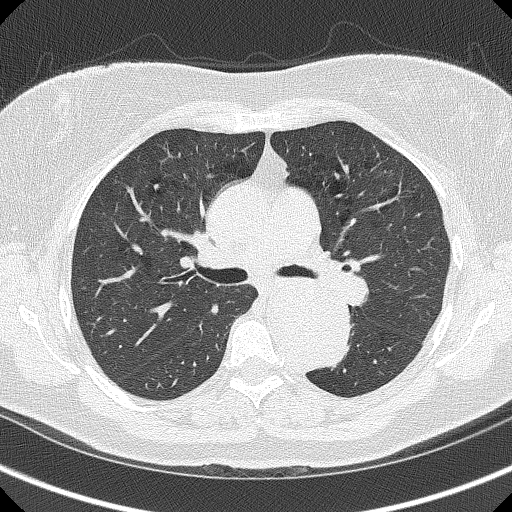
[im 208/305  mediastinal]
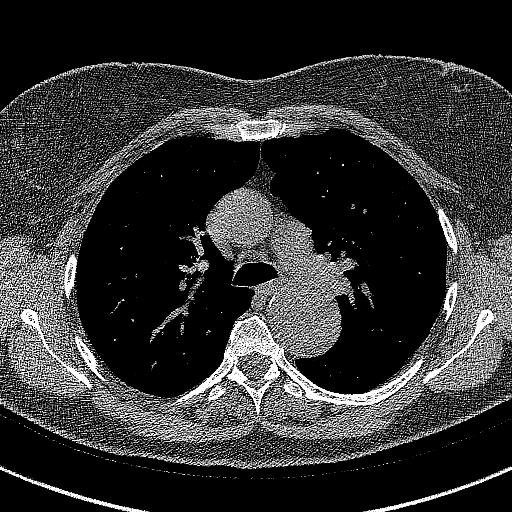
[im 208/305  lung]
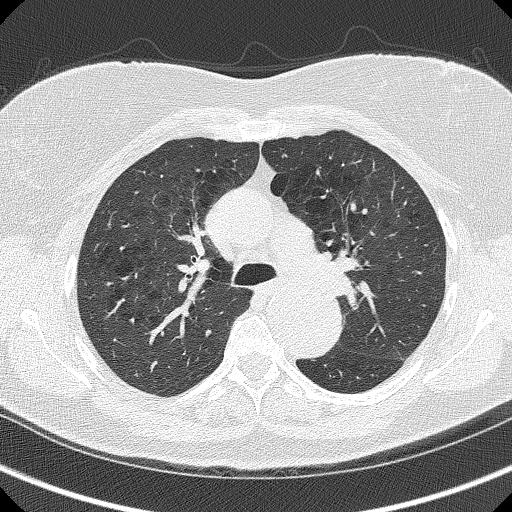
[im 235/305  lung]
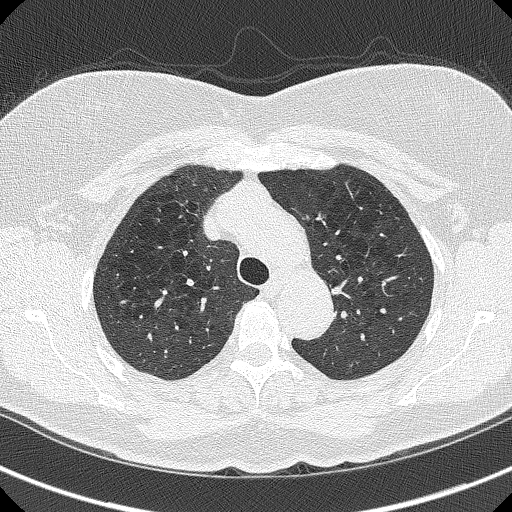
[im 263/305  lung]
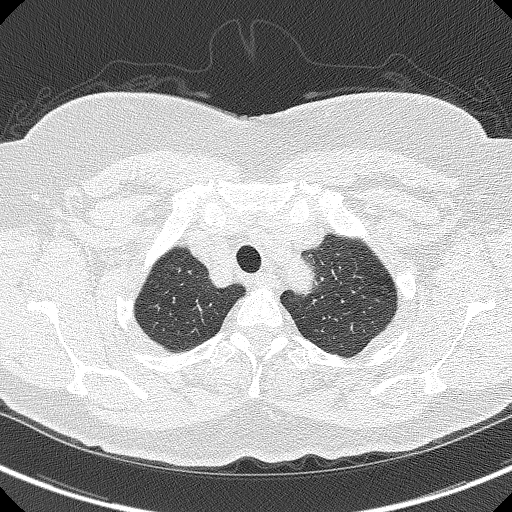
[im 291/305  lung]
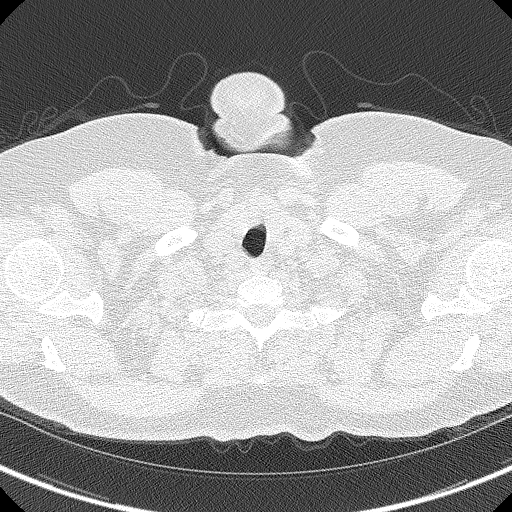

[Series 5: coronals lung 1.00 cor · coronal · 0.60mm/px · 3 of 285 slices shown]
[im 57/285  lung]
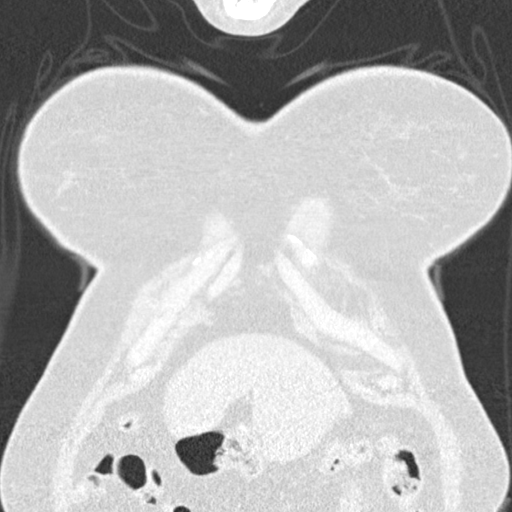
[im 114/285  lung]
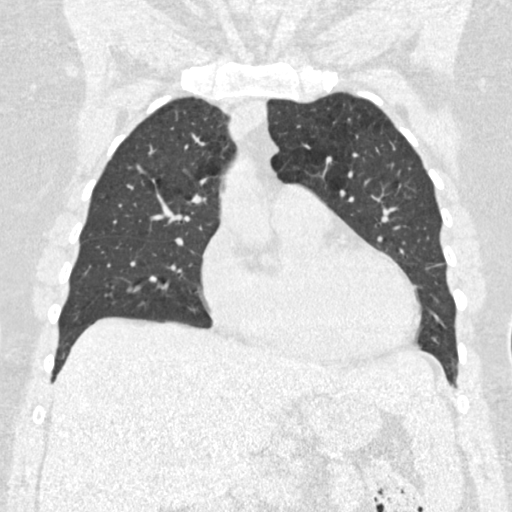
[im 171/285  lung]
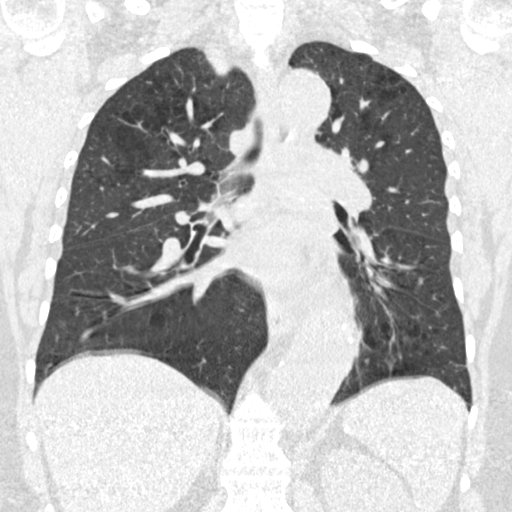

[15 of 40 positions shown; findings below may reference images not displayed]

FINDINGS: Cardiovascular: Heart size is normal. There is no significant
pericardial fluid, thickening or pericardial calcification. There is
aortic atherosclerosis, as well as atherosclerosis of the great
vessels of the mediastinum and the coronary arteries, including
calcified atherosclerotic plaque in the left main, left anterior
descending, left circumflex and right coronary arteries. Aneurysmal
dilatation of the descending thoracic aorta (axial image 39 of
series 2) measuring 5.1 x 5.0 cm. Mild calcifications of the aortic
valve.

Mediastinum/Nodes: No pathologically enlarged mediastinal or hilar
lymph nodes. Please note that accurate exclusion of hilar adenopathy
is limited on noncontrast CT scans. Esophagus is unremarkable in
appearance. No axillary lymphadenopathy.

Lungs/Pleura: Tiny pulmonary nodule in the right upper lobe near the
apex (axial image 48 of series 3), with a volume derived mean
diameter of 3.7 mm. No other larger more suspicious appearing
pulmonary nodules or masses are noted. No acute consolidative
airspace disease. No pleural effusions. Mild diffuse bronchial wall
thickening with mild to moderate centrilobular and mild paraseptal
emphysema.

Upper Abdomen: Aortic atherosclerosis.

Musculoskeletal: There are no aggressive appearing lytic or blastic
lesions noted in the visualized portions of the skeleton.
IMPRESSION: 1. Lung-RADS 2S, benign appearance or behavior. Continue annual
screening with low-dose chest CT without contrast in 12 months.
2. The "S" modifier above refers to potentially clinically
significant non lung cancer related findings. Specifically, there is
aortic atherosclerosis, in addition to left main and 3 vessel
coronary artery disease, as well as aneurysmal dilatation of the
descending thoracic aorta which measures up to 5.1 x 5.0 cm in
diameter. Recommend semi-annual imaging followup by CTA or MRA and
referral to cardiothoracic surgery if not already obtained. This
recommendation follows 6787
ACCF/AHA/AATS/ACR/ASA/SCA/RUDIJANTO/FRITZ/NASHARETH/DEVOTE Guidelines for the
Diagnosis and Management of Patients With Thoracic Aortic Disease.
Circulation. 6787; 121: [id number]. Aortic aneurysm NOS
(TVVD8-GIL.B).
3. Mild diffuse bronchial wall thickening with mild to moderate
centrilobular and mild paraseptal emphysema; imaging findings
suggestive of underlying COPD.
4. There are calcifications of the aortic valve. Echocardiographic
correlation for evaluation of potential valvular dysfunction may be
warranted if clinically indicated.

Aortic Atherosclerosis (TVVD8-IT7.7) and Emphysema (TVVD8-2M6.S).

## 2022-10-29 NOTE — Addendum Note (Signed)
Addended by: Bryna Colander on: 10/29/2022 08:00 AM   Modules accepted: Orders

## 2022-11-06 ENCOUNTER — Encounter: Payer: Self-pay | Admitting: Physician Assistant

## 2022-11-06 ENCOUNTER — Ambulatory Visit: Payer: Medicare HMO | Attending: Physician Assistant | Admitting: Physician Assistant

## 2022-11-06 VITALS — BP 144/92 | HR 67 | Ht 60.0 in | Wt 158.4 lb

## 2022-11-06 DIAGNOSIS — I10 Essential (primary) hypertension: Secondary | ICD-10-CM | POA: Diagnosis not present

## 2022-11-06 DIAGNOSIS — I716 Thoracoabdominal aortic aneurysm, without rupture, unspecified: Secondary | ICD-10-CM

## 2022-11-06 DIAGNOSIS — Z01810 Encounter for preprocedural cardiovascular examination: Secondary | ICD-10-CM

## 2022-11-06 DIAGNOSIS — Z951 Presence of aortocoronary bypass graft: Secondary | ICD-10-CM

## 2022-11-06 DIAGNOSIS — E785 Hyperlipidemia, unspecified: Secondary | ICD-10-CM

## 2022-11-06 DIAGNOSIS — I2510 Atherosclerotic heart disease of native coronary artery without angina pectoris: Secondary | ICD-10-CM

## 2022-11-06 NOTE — Patient Instructions (Signed)
Medication Instructions:  No changes at this time.   *If you need a refill on your cardiac medications before your next appointment, please call your pharmacy*   Lab Work: None  If you have labs (blood work) drawn today and your tests are completely normal, you will receive your results only by: MyChart Message (if you have MyChart) OR A paper copy in the mail If you have any lab test that is abnormal or we need to change your treatment, we will call you to review the results.   Testing/Procedures: None   Follow-Up: At [facility], you and your health needs are our priority.  As part of our continuing mission to provide you with exceptional heart care, we have created designated Provider Care Teams.  These Care Teams include your primary Cardiologist (physician) and Advanced Practice Providers (APPs -  Physician Assistants and Nurse Practitioners) who all work together to provide you with the care you need, when you need it.   Your next appointment:   4 month(s)  Provider:   Yvonne Kendall, MD or Eula Listen, PA-C

## 2022-11-06 NOTE — Progress Notes (Signed)
Cardiology Office Note    Date:  11/06/2022   ID:  [name], [name] Mar 02, 1956, MRN [id number]  PCP:  Jerl Mina, MD  Cardiologist:  Yvonne Kendall, MD  Electrophysiologist:  None   Chief Complaint: Follow up  History of Present Illness:   Kaylee Pope is a 67 y.o. 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  In this setting, she underwent preoperative cardiac risk stratification due to exertional substernal and left-sided chest discomfort.  Echo performed at [facility] on 07/10/2022 showed an EF greater than 55%, normal RV systolic function and ventricular cavity size, and mild aortic insufficiency.  Carotid artery ultrasound at that time showed less than 50% bilateral ICA stenoses.  She underwent coronary CTA on 07/12/2022 which demonstrated a calcium score of 1361 which was the 99th percentile.  There was significant dilatation of the descending thoracic aorta measuring 56 mm in diameter with severe mural atheroma.  The RCA was dominant with a CTO of the proximal to mid segment, 50 to 69% proximal LAD stenosis, and 25 to 49% proximal nondominant LCx stenosis.  CT FFR was significant in the mid LAD and LCx.  LHC on 07/23/2022 showed severe three-vessel CAD including sequential 70 to 90% proximal and mid LAD stenoses with aneurysmal segment at the takeoff of a large D1 branch, 70 to 80% proximal/mid LCx stenosis and CTO of the mid RCA with left to right collaterals.  Mildly elevated LVEDP estimated at 22 mmHg.  She was subsequently evaluated by CVTS and underwent four-vessel CABG on 07/27/2022.  Post cath course was largely uncomplicated with expected postoperative  volume overload and blood loss anemia.  She was diuresed.  She did have hypoxia with oxygen saturations in the mid 80s with ambulation and was discharged on supplemental oxygen.   She was seen in hospital follow-up on 08/14/2022 and was without symptoms of angina or cardiac decompensation.  She did note a 2-day history of increased shortness of breath.  Her weight was up 6 pounds by our scale today when compared to her visit in our office on 07/19/2022.  She was started on furosemide 40 mg daily with KCl repletion.  She followed up with cardiothoracic surgery on 08/22/2022 noting improvement in her breathing and lower extremity swelling following the initiation of furosemide.  Chest x-ray at that time showed small bilateral pleural effusions with significant improvement in effusion when compared to prior study.   She was evaluated by CVTS on 09/20/2022 and was improving.  She was no longer taking furosemide, noting stable weight and improvement in lower extremity swelling.  She was still using 1 L of supplemental oxygen at nighttime with oxygen saturations during the day in the mid 90s.  Chest x-ray showed no effusion, infiltrate, or pneumothorax with left midlung atelectasis noted.  She did complain of a burning sensation along her incision site and was advised to use Mederma.  She was last seen in the office on 09/21/2022 and was without symptoms of angina or cardiac decompensation.  Her weight was down 8 pounds when compared to her last visit.  Lower extremity swelling had resolved.  She underwent nocturnal pulse ox which was suggestive  of possible supplemental nocturnal oxygen need.  She followed up with [facility] vascular surgery on 10/18/2022 with recommendation for TEVAR.  She comes in accompanied by her husband today and is doing well from a cardiac perspective, without symptoms of angina or cardiac decompensation.  No dizziness, presyncope, or syncope.  No falls.  Tolerating cardiac medications without issues.   Home blood pressure readings were reviewed with an average BP of 116/77 with an average heart rate of 80 bpm.  Not currently using supplemental oxygen, previously on 1 L.  She notes some depression surrounding her comorbidities without thoughts of SI or HI.  [facility] Activity Status Index: > 4 METs RCRI: class II with 6.0% of adverse cardiac event in the perioperative timeframe   Labs independently reviewed: 09/2022 - Hgb 12.3, PLT 189, TC 145, TG 108, HDL 53, LDL 70, potassium 3.8, BUN 16, serum creatinine 0.8, albumin 4.3, AST/ALT normal, TSH normal  Past Medical History:  Diagnosis Date   Allergy    CAD (coronary artery disease)    a. s/p 4-V CABG (LIMA-LAD, VG-diag, VG-OM, VG-PDA) 07/2022   Hyperlipidemia    Hypertension    Thoracoabdominal aortic aneurysm (HCC)    Thyroid disease     Past Surgical History:  Procedure Laterality Date   CORONARY ARTERY BYPASS GRAFT N/A 07/27/2022   Procedure: CORONARY ARTERY BYPASS GRAFTING (CABG) X4 USING LEFT INTERNAL MAMMARY ARTERY AND BILATERAL ENDOSCOPIC HARVESTED GREATER SAPHENOUS VEINS;  Surgeon: Alleen Borne, MD;  Location: [facility] OR;  Service: Open Heart Surgery;  Laterality: N/A;   LEFT HEART CATH AND CORONARY ANGIOGRAPHY N/A 07/23/2022   Procedure: LEFT HEART CATH AND CORONARY ANGIOGRAPHY;  Surgeon: Yvonne Kendall, MD;  Location: [facility] INVASIVE CV LAB;  Service: Cardiovascular;  Laterality: N/A;   TEE WITHOUT CARDIOVERSION N/A 07/27/2022   Procedure: TRANSESOPHAGEAL ECHOCARDIOGRAM;  Surgeon: Alleen Borne, MD;  Location: [facility];  Service: Open Heart Surgery;  Laterality: N/A;   TONSILLECTOMY     age 46    Current Medications: Current Meds  Medication Sig   acetaminophen (TYLENOL) 325 MG tablet Take 2 tablets (650 mg total) by mouth every 6 (six) hours as needed for mild pain or moderate pain.   amLODipine (NORVASC) 5 MG tablet Take 1.5 tablets (7.5 mg total) by mouth daily. (Patient taking differently: Take 5 mg by mouth daily.)   aspirin EC  325 MG tablet Take 1 tablet (325 mg total) by mouth daily.   carvedilol (COREG) 12.5 MG tablet Take 1 tablet (12.5 mg total) by mouth 2 (two) times daily.   Ibuprofen-Acetaminophen (ACETAMINOPHEN-IBUPROFEN PO) Take by mouth as needed.   levothyroxine (SYNTHROID, LEVOTHROID) 75 MCG tablet Take 1 tablet (75 mcg total) by mouth daily.   OMEPRAZOLE PO Take 1 tablet by mouth daily.   rosuvastatin (CRESTOR) 20 MG tablet Take 1 tablet (20 mg total) by mouth daily.   venlafaxine XR (EFFEXOR-XR) 150 MG 24 hr capsule Take 1 capsule (150 mg total) by mouth daily.    Allergies:   Patient has no known allergies.   Social History   Socioeconomic History   Marital status: Married    Spouse name: Not on file   Number of children: 2   Years of education: Not on file   Highest education level: Not on file  Occupational History   Not on file  Tobacco Use   Smoking status: Former    Current packs/day: 0.25    Average packs/day: 0.3 packs/day for 50.0 years (12.5 ttl pk-yrs)  Types: Cigarettes    Passive exposure: Current   Smokeless tobacco: Never   Tobacco comments:    Stopped smoking 4/46/2024  Vaping Use   Vaping status: Never Used  Substance and Sexual Activity   Alcohol use: Not Currently   Drug use: Never   Sexual activity: Not on file  Other Topics Concern   Not on file  Social History Narrative   Engineer, materials at [employer]   One grandson   Social Determinants of Health   Financial Resource Strain: Low Risk  (10/10/2022)   Received from [facility], [facility]   Overall Financial Resource Strain (CARDIA)    Difficulty of Paying Living Expenses: Not very hard  Food Insecurity: No Food Insecurity (10/10/2022)   Received from [facility], [facility]   Hunger Vital Sign    Worried About Running Out of Food in the Last Year: Never true    Ran Out of Food in the Last Year: Never true  Transportation  Needs: No Transportation Needs (10/10/2022)   Received from [facility], [facility]   [facility] - Transportation    In the past 12 months, has lack of transportation kept you from medical appointments or from getting medications?: No    Lack of Transportation (Non-Medical): No  Physical Activity: Not on file  Stress: Not on file  Social Connections: Not on file     Family History:  The patient's family history includes Breast cancer (age of onset: 70) in her maternal aunt; COPD in her mother; Cancer in her maternal aunt; Diabetes in her sister; Heart Problems in her maternal grandmother; Heart attack in her father; Stroke in her father.  ROS:   12-point review of systems is negative unless otherwise noted in the HPI.   EKGs/Labs/Other Studies Reviewed:    Studies reviewed were summarized above. The additional studies were reviewed today:  2D echo 07/23/2022: 1. Left ventricular ejection fraction, by estimation, is 55 to 60%. The  left ventricle has normal function. The left ventricle has no regional  wall motion abnormalities. There is mild concentric left ventricular  hypertrophy. Left ventricular diastolic  parameters are indeterminate.   2. Right ventricular systolic function is normal. The right ventricular  size is normal.   3. The mitral valve is normal in structure. Trivial mitral valve  regurgitation. No evidence of mitral stenosis.   4. The aortic valve is normal in structure. Aortic valve regurgitation is  not visualized. No aortic stenosis is present.   5. Abodminal aorta is dilated ( 3.3cm).   6. The inferior vena cava is normal in size with greater than 50%  respiratory variability, suggesting right atrial pressure of 3 mmHg.   7. Evidence of atrial level shunting detected by color flow Doppler.  Small secundum ASD noted 1.20 cm.  __________   LHC/15/2024: Conclusions: Severe three-vessel coronary artery disease including  sequential 70-90% proximal and mid LAD stenoses with aneurysmal segment at the takeoff of large first diagonal branch, 70-80% proximal/mid LCx stenosis, and chronic total occlusion of mid RCA with left-to-right collaterals. Mildly elevated left ventricular filling pressure (LVEDP 22 mmHg).  LVEF known to be normal based on recent echocardiogram at [facility].   Recommendations: Admit for antianginal therapy, as progressive atypical left-sided chest pain is most likely the patient's anginal equivalent, as well as cardiac surgery consultation for CABG. Aggressive secondary prevention of coronary artery disease. __________   Coronary CTA with  CT FFR/07/2022: IMPRESSION: 1. Descending thoracic aortic aneurysm this should be followed up with CTA of the chest for complete evaluation. 2. Otherwise no significant extracardiac incidental findings were seen.   FINDINGS: Aorta: Severe dilation of descending thoracic aorta, 56 mm in diameter, with severe mural atheroma. No dissection.   Aortic Valve:  Trileaflet. mild calcifications.   Coronary Arteries:  Normal coronary origin.  Right dominance.   RCA is a dominant artery. There is calcified and non calcified plaque proximally causing total occlusion in the proximal-mid segment (100%).   Left main gives rise to LAD and LCX arteries. LM has no disease.   LAD has calcified plaque proximally causing moderate stenosis (50-69%).   LCX is a non-dominant artery. There is calcified plaque in the proximal segment causing mild stenosis (25-49%).   Other findings:   Normal pulmonary vein drainage into the left atrium.   Normal left atrial appendage without a thrombus.   Normal size of the pulmonary artery.   IMPRESSION: 1. Coronary calcium score of 1361. This was 99th percentile for age and sex matched control. 2. Normal coronary origin with right dominance. 3. CTO of proximal to mid RCA. 4. Moderate proximal LAD stenosis (50-69%). 5. Mild  proximal LCx stenosis (25-49%). 6. Known severe dilation of descending thoracic aorta, 56 mm in diameter, with severe mural atheroma. 7. CAD-RADS 5 Total coronary occlusion (100%). Consider cardiac catheterization or viability assessment. Consider symptom-guided anti-ischemic pharmacotherapy as well as risk factor modification per guideline directed care. 8. Will submit CT FFR to analyze proximal LAD. Will be reported separately.   1. Left Main:  No significant stenosis.   2. LAD: significant stenosis in the mid LAD.  FFRct 0.71 3. LCX: significant stenosis in the mid LCx.  FFRct 0.77 4. RCA: Total occlusion of mid RCA.   IMPRESSION: 1. CT FFR analysis showed significant stenosis in the mid LAD and LCx. 2.  Total occlusion of mid RCA. 3.  Cardiac catheterization recommended.   EKG:  EKG is not ordered today.    Recent Labs: 07/30/2022: Magnesium 2.3 09/21/2022: ALT 18; BUN 15; Creatinine, Ser 0.69; Hemoglobin 11.7; Platelets 174; Potassium 3.4; Sodium 143  Recent Lipid Panel    Component Value Date/Time   CHOL 130 09/21/2022 1551   TRIG 111 09/21/2022 1551   HDL 52 09/21/2022 1551   CHOLHDL 2.5 09/21/2022 1551   VLDL 22 09/21/2022 1551   LDLCALC 56 09/21/2022 1551   LDLDIRECT 64 09/21/2022 1551    PHYSICAL EXAM:    VS:  BP (!) 144/92 (BP Location: Left Arm, Patient Position: Sitting, Cuff Size: Normal)   Pulse 67   Ht 5' (1.524 m)   Wt 158 lb 6.4 oz (71.8 kg)   SpO2 94%   BMI 30.94 kg/m   BMI: Body mass index is 30.94 kg/m.  Physical Exam Vitals reviewed.  Constitutional:      Appearance: She is well-developed.  HENT:     Head: Normocephalic and atraumatic.  Eyes:     General:        Right eye: No discharge.        Left eye: No discharge.  Neck:     Vascular: No JVD.  Cardiovascular:     Rate and Rhythm: Normal rate and regular rhythm.     Heart sounds: Normal heart sounds, S1 normal and S2 normal. Heart sounds not distant. No midsystolic click and no  opening snap. No murmur heard.    No friction rub.  Comments: Well-healed anterior chest wall midline surgical incision site. Pulmonary:     Effort: Pulmonary effort is normal. No respiratory distress.     Breath sounds: Normal breath sounds. No decreased breath sounds, wheezing or rales.  Chest:     Chest wall: No tenderness.  Abdominal:     General: There is no distension.  Musculoskeletal:     Cervical back: Normal range of motion.     Right lower leg: No edema.     Left lower leg: No edema.  Skin:    General: Skin is warm and dry.     Nails: There is no clubbing.  Neurological:     Mental Status: She is alert and oriented to person, place, and time.  Psychiatric:        Speech: Speech normal.        Behavior: Behavior normal.        Thought Content: Thought content normal.        Judgment: Judgment normal.     Wt Readings from Last 3 Encounters:  11/06/22 158 lb 6.4 oz (71.8 kg)  09/21/22 163 lb 12.8 oz (74.3 kg)  09/20/22 163 lb (73.9 kg)     ASSESSMENT & PLAN:   CAD status post CABG without angina: She is doing well and without symptoms concerning for cardiac decompensation.  Continue aggressive risk factor modification and secondary prevention aspirin, amlodipine and rosuvastatin.  Recommend cardiac rehab once cleared from a vascular surgery perspective.  No indication for further ischemic testing at this time.  Thoracoabdominal aortic aneurysm: Followed by [facility].  Scheduled for TEVAR.  HTN: Blood pressure is mildly elevated in the office today though well-controlled at home.  She remains on amlodipine 5 mg daily and carvedilol 12.5 mg twice daily.  HLD: LDL 78.  LP(a) 307.  Target LDL less than 55.  She remains on rosuvastatin 20 mg.  Nocturnal hypoxia: Noted on recent nocturnal pulse ox study.  Has been referred to pulmonology.  Preoperative cardiac risk stratification: Scheduled to undergo TEVAR.  She is doing well from a cardiac perspective and has been  surgically revascularized.  She is without symptoms of angina or cardiac decompensation.  No further cardiac testing or intervention will further mitigate her perioperative risk.  She may proceed with noncardiac surgery without further testing.   Disposition: F/u with Dr. Okey Dupre or an APP in 4 months.   Medication Adjustments/Labs and Tests Ordered: Current medicines are reviewed at length with the patient today.  Concerns regarding medicines are outlined above. Medication changes, Labs and Tests ordered today are summarized above and listed in the Patient Instructions accessible in Encounters.   Signed, Eula Listen, PA-C 11/06/2022 1:05 PM     [facility] [place] [address] [place], [place] [address] [phone]

## 2022-11-23 ENCOUNTER — Emergency Department (HOSPITAL_BASED_OUTPATIENT_CLINIC_OR_DEPARTMENT_OTHER): Payer: Medicare HMO

## 2022-11-23 ENCOUNTER — Other Ambulatory Visit: Payer: Self-pay

## 2022-11-23 ENCOUNTER — Other Ambulatory Visit (HOSPITAL_BASED_OUTPATIENT_CLINIC_OR_DEPARTMENT_OTHER): Payer: Self-pay

## 2022-11-23 ENCOUNTER — Emergency Department (HOSPITAL_BASED_OUTPATIENT_CLINIC_OR_DEPARTMENT_OTHER)
Admission: EM | Admit: 2022-11-23 | Discharge: 2022-11-23 | Disposition: A | Discharge status: Home / Self Care | Payer: Medicare HMO | Attending: Emergency Medicine | Admitting: Emergency Medicine

## 2022-11-23 DIAGNOSIS — M546 Pain in thoracic spine: Secondary | ICD-10-CM | POA: Insufficient documentation

## 2022-11-23 DIAGNOSIS — G8918 Other acute postprocedural pain: Secondary | ICD-10-CM

## 2022-11-23 DIAGNOSIS — I10 Essential (primary) hypertension: Secondary | ICD-10-CM | POA: Insufficient documentation

## 2022-11-23 DIAGNOSIS — Z79899 Other long term (current) drug therapy: Secondary | ICD-10-CM | POA: Diagnosis not present

## 2022-11-23 DIAGNOSIS — Z7982 Long term (current) use of aspirin: Secondary | ICD-10-CM | POA: Insufficient documentation

## 2022-11-23 LAB — COMPREHENSIVE METABOLIC PANEL
ALT: 12 U/L (ref 0–44)
AST: 11 U/L — ABNORMAL LOW (ref 15–41)
Albumin: 3.9 g/dL (ref 3.5–5.0)
Alkaline Phosphatase: 91 U/L (ref 38–126)
Anion gap: 8 (ref 5–15)
BUN: 10 mg/dL (ref 8–23)
CO2: 30 mmol/L (ref 22–32)
Calcium: 9.2 mg/dL (ref 8.9–10.3)
Chloride: 102 mmol/L (ref 98–111)
Creatinine, Ser: 0.73 mg/dL (ref 0.44–1.00)
GFR, Estimated: >60 mL/min (ref >60)
Glucose, Bld: 102 mg/dL — ABNORMAL HIGH (ref 70–99)
Potassium: 4 mmol/L (ref 3.5–5.1)
Sodium: 140 mmol/L (ref 135–145)
Total Bilirubin: 0.4 mg/dL (ref 0.3–1.2)
Total Protein: 6.8 g/dL (ref 6.5–8.1)

## 2022-11-23 LAB — CBC WITH DIFFERENTIAL/PLATELET
Abs Immature Granulocytes: 0.02 10*3/uL (ref 0.00–0.07)
Basophils Absolute: 0 10*3/uL (ref 0.0–0.1)
Basophils Relative: 1 %
Eosinophils Absolute: 0.2 10*3/uL (ref 0.0–0.5)
Eosinophils Relative: 2 %
HCT: 35.2 % — ABNORMAL LOW (ref 36.0–46.0)
Hemoglobin: 11 g/dL — ABNORMAL LOW (ref 12.0–15.0)
Immature Granulocytes: 0 %
Lymphocytes Relative: 16 %
Lymphs Abs: 1 10*3/uL (ref 0.7–4.0)
MCH: 28.1 pg (ref 26.0–34.0)
MCHC: 31.3 g/dL (ref 30.0–36.0)
MCV: 89.8 fL (ref 80.0–100.0)
Monocytes Absolute: 0.6 10*3/uL (ref 0.1–1.0)
Monocytes Relative: 9 %
Neutro Abs: 4.5 10*3/uL (ref 1.7–7.7)
Neutrophils Relative %: 72 %
Platelets: 188 10*3/uL (ref 150–400)
RBC: 3.92 MIL/uL (ref 3.87–5.11)
RDW: 13.9 % (ref 11.5–15.5)
WBC: 6.4 10*3/uL (ref 4.0–10.5)
nRBC: 0 % (ref 0.0–0.2)

## 2022-11-23 LAB — TROPONIN I (HIGH SENSITIVITY): Troponin I (High Sensitivity): 5 ng/L (ref <18)

## 2022-11-23 MED ORDER — CARVEDILOL 12.5 MG PO TABS
12.5000 mg | ORAL_TABLET | Freq: Once | ORAL | Status: AC
  Administered 2022-11-23: 12.5 mg via ORAL
  Filled 2022-11-23: qty 1

## 2022-11-23 MED ORDER — ONDANSETRON 4 MG PO TBDP: 4.0000 mg | ORAL_TABLET | Freq: Three times a day (TID) | ORAL | 0 refills | Status: DC | PRN

## 2022-11-23 MED ORDER — TRAMADOL HCL 50 MG PO TABS
50.0000 mg | ORAL_TABLET | Freq: Three times a day (TID) | ORAL | 0 refills | Status: DC | PRN
Start: 2022-11-23 — End: 2023-06-26

## 2022-11-23 MED ORDER — ACETAMINOPHEN 325 MG PO TABS
650.0000 mg | ORAL_TABLET | Freq: Once | ORAL | Status: AC
  Administered 2022-11-23: 650 mg via ORAL
  Filled 2022-11-23: qty 2

## 2022-11-23 MED ORDER — IOHEXOL 350 MG/ML SOLN
100.0000 mL | Freq: Once | INTRAVENOUS | Status: AC | PRN
  Administered 2022-11-23: 100 mL via INTRAVENOUS

## 2022-11-23 NOTE — ED Triage Notes (Signed)
Pt arrived POV. Pt caox4, ambulatory, NAD c/o sharp pain around her upper torso (upper abd and back) that has been ongoing since have surgery on AAA 8/6 at [facility]. Pt denies any additional new s/s since surgery. Pt expressed frustration about communicating with doctors involved with surgery about the new pain and was told to go to the ED.

## 2022-11-23 NOTE — ED Provider Notes (Signed)
[place] EMERGENCY DEPARTMENT AT [facility] Provider Note   CSN: [id number] Arrival date & time: 11/23/22  4782     History  Chief Complaint  Patient presents with   Back Pain    DARNEL HUE is a 67 y.o. female.  Patient here with mid back pain for the last few days maybe a little bit longer.  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  Had upper thoracic portion done.  Supposed to have next procedure next month.  She also had cardiac bypass surgery about 4 months ago as well.  She denies any chest pain or shortness of breath.  She has been having this vague pain failure last few days.  Talk with her surgeon who recommended she come for evaluation.  She denies any fevers or chills or cough or sputum production.  Nothing makes it worse or better.  History of hypertension, high cholesterol otherwise.  They went in through her going for the surgery.  Not have any discomfort there.  The history is provided by the patient.       Home Medications Prior to Admission medications   Medication Sig Start Date End Date Taking? Authorizing Provider  ondansetron (ZOFRAN-ODT) 4 MG disintegrating tablet Take 1 tablet (4 mg total) by mouth every 8 (eight) hours as needed. 11/23/22  Yes Clarance Bollard, DO  traMADol (ULTRAM) 50 MG tablet Take 1 tablet (50 mg total) by mouth every 8 (eight) hours as needed. 11/23/22  Yes Zariyah Stephens, DO  acetaminophen (TYLENOL) 325 MG tablet Take 2 tablets (650 mg total) by mouth every 6 (six) hours as needed for mild pain or moderate pain. 08/02/22   Leary Roca, PA-C  amLODipine (NORVASC) 5 MG tablet Take 1.5 tablets (7.5 mg total) by mouth daily. Patient taking differently: Take 5 mg by mouth daily. 09/28/22   Sondra Barges, PA-C  aspirin EC 325 MG tablet Take 1 tablet (325 mg total) by mouth daily. 08/02/22   Leary Roca, PA-C  carvedilol (COREG) 12.5 MG tablet Take 1 tablet (12.5 mg total) by mouth 2 (two)  times daily. 09/21/22 12/20/22  Sondra Barges, PA-C  Ibuprofen-Acetaminophen (ACETAMINOPHEN-IBUPROFEN PO) Take by mouth as needed.    [provider]  levothyroxine (SYNTHROID, LEVOTHROID) 75 MCG tablet Take 1 tablet (75 mcg total) by mouth daily. 12/27/14   Dianne Dun, MD  OMEPRAZOLE PO Take 1 tablet by mouth daily.    [provider]  rosuvastatin (CRESTOR) 20 MG tablet Take 1 tablet (20 mg total) by mouth daily. 06/20/22   End, Cristal Deer, MD  venlafaxine XR (EFFEXOR-XR) 150 MG 24 hr capsule Take 1 capsule (150 mg total) by mouth daily. 12/27/14   Dianne Dun, MD      Allergies    Patient has no known allergies.    Review of Systems   Review of Systems  Physical Exam Updated Vital Signs BP 138/87   Pulse 70   Temp 98 F (36.7 C) (Oral)   Resp 20   SpO2 98%  Physical Exam Vitals and nursing note reviewed.  Constitutional:      General: She is not in acute distress.    Appearance: She is well-developed. She is not ill-appearing.  HENT:     Head: Normocephalic and atraumatic.     Nose: Nose normal.     Mouth/Throat:     Mouth: Mucous membranes are moist.  Eyes:     Extraocular Movements: Extraocular movements  intact.     Conjunctiva/sclera: Conjunctivae normal.     Pupils: Pupils are equal, round, and reactive to light.  Cardiovascular:     Rate and Rhythm: Normal rate and regular rhythm.     Pulses: Normal pulses.     Heart sounds: Normal heart sounds. No murmur heard. Pulmonary:     Effort: Pulmonary effort is normal. No respiratory distress.     Breath sounds: Normal breath sounds.  Abdominal:     General: Abdomen is flat.     Palpations: Abdomen is soft.     Tenderness: There is no abdominal tenderness.  Musculoskeletal:        General: No swelling. Normal range of motion.     Cervical back: Normal range of motion and neck supple.  Skin:    General: Skin is warm and dry.     Capillary Refill: Capillary refill takes less than 2 seconds.   Neurological:     General: No focal deficit present.     Mental Status: She is alert.  Psychiatric:        Mood and Affect: Mood normal.     ED Results / Procedures / Treatments   Labs (all labs ordered are listed, but only abnormal results are displayed) Labs Reviewed  CBC WITH DIFFERENTIAL/PLATELET - Abnormal; Notable for the following components:      Result Value   Hemoglobin 11.0 (*)    HCT 35.2 (*)    All other components within normal limits  COMPREHENSIVE METABOLIC PANEL - Abnormal; Notable for the following components:   Glucose, Bld 102 (*)    AST 11 (*)    All other components within normal limits  TROPONIN I (HIGH SENSITIVITY)    EKG EKG Interpretation Date/Time:  Friday November 23 2022 09:49:24 EDT Ventricular Rate:  70 PR Interval:  169 QRS Duration:  97 QT Interval:  428 QTC Calculation: 462 R Axis:   74  Text Interpretation: Sinus rhythm Confirmed by Virgina Norfolk [phone]) on 11/23/2022 9:59:58 AM  Radiology CT Angio Chest/Abd/Pel for Dissection W and/or Wo Contrast  Result Date: 11/23/2022 CLINICAL DATA:  Status post stent graft repair of thoracic aortic aneurysm EXAM: CT ANGIOGRAPHY CHEST, ABDOMEN AND PELVIS TECHNIQUE: Non-contrast CT of the chest was initially obtained. Multidetector CT imaging through the chest, abdomen and pelvis was performed using the standard protocol during bolus administration of intravenous contrast. Multiplanar reconstructed images and MIPs were obtained and reviewed to evaluate the vascular anatomy. RADIATION DOSE REDUCTION: This exam was performed according to the departmental dose-optimization program which includes automated exposure control, adjustment of the mA and/or kV according to patient size and/or use of iterative reconstruction technique. CONTRAST:  OMNIPAQUE IOHEXOL 350 MG/ML SOLN COMPARISON:  None Available. FINDINGS: CTA CHEST FINDINGS Cardiovascular: The heart size appears normal. Postoperative changes from  prior median sternotomy and CABG procedure. Aortic atherosclerosis. Postoperative changes from recent stent graft repair of the descending thoracic aorta. Signs of endoleak at the level of the distal portion of the graft secondary to incomplete distal apposition of the stent graft, image 98/5 in sagittal image 129/9. Proximal portion of the aneurysm sac measures 5.9 cm, image 133/9. On 12/21/2021 this measured 5.5 cm. The distal thoracic aortic aneurysm measures 5.5 cm, image 137/9. Formally this measured 5.4 cm. Mediastinum/Nodes: Thyroid gland, trachea, and esophagus are unremarkable. No enlarged mediastinal or hilar lymph nodes. Lungs/Pleura: Bandlike area of scarring noted within the left upper lobe. Moderate centrilobular and paraseptal emphysema. No pleural effusion or airspace consolidation.  No suspicious pulmonary nodules. Musculoskeletal: No chest wall abnormality. No acute or significant osseous findings. Review of the MIP images confirms the above findings. CTA ABDOMEN AND PELVIS FINDINGS VASCULAR Aorta: Heterogeneous and irregular atherosclerotic plaque throughout the abdominal aorta. Aneurysmal dilation of the visceral segment with a maximal diameter of 4.5 cm, formally 4.1 cm. The aorta then resumes a normal caliber in till the infrarenal segment were there is mild focal aneurysmal dilatation with a maximal diameter of 3.4 cm, previously 3.2 cm. No dissection identified. Celiac: Patent without evidence of aneurysm, dissection, vasculitis or significant stenosis. SMA: Patent without evidence of aneurysm, dissection, vasculitis or significant stenosis. Renals: Both renal arteries are patent without evidence of aneurysm, dissection, vasculitis, fibromuscular dysplasia or significant stenosis. IMA: Patent without evidence of aneurysm, dissection, vasculitis or significant stenosis. Inflow: Patent without evidence of aneurysm, dissection, vasculitis or significant stenosis. Veins: No obvious venous  abnormality within the limitations of this arterial phase study. Review of the MIP images confirms the above findings. NON-VASCULAR Hepatobiliary: No focal liver abnormality is seen. No gallstones, gallbladder wall thickening, or biliary dilatation. Pancreas: Unremarkable. No pancreatic ductal dilatation or surrounding inflammatory changes. Spleen: Normal in size without focal abnormality. Adrenals/Urinary Tract: Adrenal glands are unremarkable. Kidneys are normal, without renal calculi, focal lesion, or hydronephrosis. Bladder is unremarkable. Stomach/Bowel: Stomach appears within normal limits. The appendix is visualized and is normal. No pathologic dilatation of the large or small bowel loops. No signs of bowel wall thickening or inflammation. Lymphatic: No enlarged lymph nodes. Reproductive: Small calcified uterine fibroid.  No adnexal mass. Other: No ascites or focal fluid collections. Musculoskeletal: No acute or significant osseous findings. Review of the MIP images confirms the above findings. IMPRESSION: 1. Postoperative changes from recent stent graft repair of the descending thoracic aortic aneurysm signs of endoleak at the level of the distal portion of the graft secondary to incomplete distal apposition of the stent graft. Proximal portion of the aneurysm sac measures 5.9 cm, formally 5.5 cm. The distal thoracic aortic aneurysm measures 5.5 cm, formally 5.4 cm. 2. Aneurysmal dilation of the visceral segment of the abdominal aorta with a maximal diameter of 4.5 cm, formally 4.1 cm. 3. Mild focal aneurysmal dilatation of the infrarenal segment of the abdominal aorta with a maximal diameter of 3.4 cm, previously 3.2 cm. 4. No signs of acute aortic dissection. 5. Aortic Atherosclerosis (ICD10-I70.0) and Emphysema (ICD10-J43.9). These results were called by telephone at the time of interpretation on 11/23/2022 at 12:11 pm to provider Jadore Mcguffin , who verbally acknowledged these results. Electronically  Signed   By: Signa Kell M.D.   On: 11/23/2022 12:11   DG Chest Portable 1 View  Result Date: 11/23/2022 CLINICAL DATA:  Ongoing sharp pain in the upper abdomen and back status post aortic aneurysm repair on 11/13/2022 EXAM: PORTABLE CHEST 1 VIEW COMPARISON:  Chest radiograph dated 09/20/2022 FINDINGS: Aortic stent graft is seen projecting over the aortic arch and descending thoracic aorta. Well inflated lungs. Increased hazy left basilar opacity. No pneumothorax. The heart size and mediastinal contours are within normal limits. Median sternotomy wires are nondisplaced. IMPRESSION: 1. Interval placement of aortic stent graft. 2. Increased hazy left basilar opacity, which may represent atelectasis, layering pleural effusion, or infection. Electronically Signed   By: Agustin Cree M.D.   On: 11/23/2022 10:08    Procedures Procedures    Medications Ordered in ED Medications  iohexol (OMNIPAQUE) 350 MG/ML injection 100 mL (100 mLs Intravenous Contrast Given 11/23/22 1054)  ED Course/ Medical Decision Making/ A&P                                 Medical Decision Making Amount and/or Complexity of Data Reviewed Labs: ordered. Radiology: ordered.  Risk Prescription drug management.   HOLLEN LAWTER is here with back pain.  History of coronary bypass surgery 4 months ago and now she had TEVAR procedure stop 1 of 2 a few weeks ago at [facility] for thoracic aorta aneurysm.  Abdominal aneurysm to be repaired later next month.  She has been having a little bit of sharp and dull pain around her upper torso and back.  She was told to come here by her surgeon.  Patient denies any chest pain weakness or chills or fever or cough or sputum production.  She looks well.  EKG shows sinus rhythm.  No ischemic changes.  Neurovascularly neuromuscular she appears to be intact.  Differential diagnosis could be general postop pain but could be a postop complication including infectious process or may be some issue with  her initial repair.  Seems to be less likely to be pericarditis or myocarditis given that her CABG was done 4 months ago.  Overall will get CBC, BMP, troponin, CT dissection study and reevaluate.  Per my review and interpretation of labs is no significant anemia or electrolyte abnormality or kidney injury or leukocytosis.  Troponin is normal.  CT scan shows radiology report over the phone they say there might be a little bit of endoleak at the distal portion of the graft.  Overall aneurysms slightly larger than prior imaging.  I talked with Dr. Griselda Miner with fast surgery at [facility] who also talked with Dr. Pearlean Brownie who reviewed images.  Ultimately they feel comfortable with how the images look.  They expect an endoleak at this time.  Overall they wanted me to have shared decision with the patient about whether or not she go to [facility] for observation overnight versus if she felt comfortable following up closely outpatient.  Overall her pain is fairly mild.  We had extensive conversation and ultimately I made the recommendation that she likely go for observation stay with [facility] as it is likely the safest option to make sure were not missing anything emergent but ultimately she is feeling well with good vitals and overall reassuring workup.  Patient ultimately decided that she wanted to follow-up closely outpatient and she understands return precautions.  Specifically worsening pain, lightheadedness, chest pain, severe abdominal pain that she should return for evaluation.  Overall will prescribe tramadol and Zofran and she feels comfortable with this plan and ultimately understands the risks and benefits.  Patient was discharged understands return precautions.  Discharged in good condition.  Vascular surgery team at [facility] okay with this plan as well.  They were updated with the plan.  This chart was dictated using voice recognition software.  Despite best efforts to proofread,  errors can occur which can change the  documentation meaning.         Final Clinical Impression(s) / ED Diagnoses Final diagnoses:  Post-operative pain    Rx / DC Orders ED Discharge Orders          Ordered    traMADol (ULTRAM) 50 MG tablet  Every 8 hours PRN        11/23/22 1417    ondansetron (ZOFRAN-ODT) 4 MG disintegrating tablet  Every 8 hours PRN  11/23/22 1417              Virgina Norfolk, DO 11/23/22 1418

## 2022-11-23 NOTE — ED Notes (Signed)
Face sheet faxed to [facility] [phone]

## 2022-11-23 NOTE — ED Notes (Signed)
Pt discharged home and given discharge paperwork. Opportunities given for questions. Pt verbalizes understanding. PIV removed x1. Stone,Heather R , RN 

## 2022-11-23 NOTE — ED Provider Notes (Signed)
Patient had been hypertensive here and after getting Tylenol and carvedilol blood pressure improved to 166/101 171/98.  Again consulted the [facility] vascular fellow discussed these elevated blood pressures with them as patient reports at home typically her blood pressure has been between 130s and 140s.  That is what it was initially when she came in today.  They reported they allow some permissive hypertension and would just like it to be under 180.  They do not want her to restart her amlodipine but continue her carvedilol.  And they want to follow with her closely next week and we will then discuss whether she needs any further blood pressure medication.  This was discussed with the patient and her husband.  She would still like to go home.   Gwyneth Sprout, MD 11/23/22 1740

## 2022-11-23 NOTE — Discharge Instructions (Signed)
As we discussed please return to the emergency department if your symptoms worsen.  Please call your vascular surgery team to confirm appointment with them.  Do not hesitate to return if you develop worsening pain or other concerning symptoms or have any other questions.  I have discussed findings with your vascular surgery team at [facility] today.  They are well aware of what is going on.  Do not hesitate to call them as well.

## 2022-11-23 NOTE — ED Notes (Signed)
[facility] transfer/consult line called

## 2022-11-27 ENCOUNTER — Ambulatory Visit: Payer: Medicare HMO | Admitting: Physician Assistant

## 2022-11-29 ENCOUNTER — Encounter: Payer: Self-pay | Admitting: Cardiology

## 2022-11-29 ENCOUNTER — Ambulatory Visit: Payer: Medicare HMO | Attending: Cardiology | Admitting: Cardiology

## 2022-11-29 ENCOUNTER — Telehealth: Payer: Self-pay | Admitting: Cardiology

## 2022-11-29 VITALS — BP 164/78 | HR 67 | Ht 60.0 in | Wt 157.4 lb

## 2022-11-29 DIAGNOSIS — R072 Precordial pain: Secondary | ICD-10-CM

## 2022-11-29 DIAGNOSIS — Z951 Presence of aortocoronary bypass graft: Secondary | ICD-10-CM | POA: Diagnosis not present

## 2022-11-29 DIAGNOSIS — I2510 Atherosclerotic heart disease of native coronary artery without angina pectoris: Secondary | ICD-10-CM | POA: Diagnosis not present

## 2022-11-29 DIAGNOSIS — R079 Chest pain, unspecified: Secondary | ICD-10-CM

## 2022-11-29 DIAGNOSIS — E039 Hypothyroidism, unspecified: Secondary | ICD-10-CM

## 2022-11-29 DIAGNOSIS — I10 Essential (primary) hypertension: Secondary | ICD-10-CM

## 2022-11-29 DIAGNOSIS — E785 Hyperlipidemia, unspecified: Secondary | ICD-10-CM

## 2022-11-29 DIAGNOSIS — I716 Thoracoabdominal aortic aneurysm, without rupture, unspecified: Secondary | ICD-10-CM

## 2022-11-29 NOTE — Telephone Encounter (Signed)
Added patient to see Charlsie Quest at 1:55 pm

## 2022-11-29 NOTE — H&P (View-Only) (Signed)
 Cardiology Office Note:  .   Date:  11/29/2022  ID:  Kaylee Pope, DOB [birth year], MRN [id number] PCP: Valora Agent, MD   HeartCare Providers Cardiologist:  Lonni Hanson, MD    History of Present Illness: .   Kaylee Pope is a 67 y.o. female with history of coronary artery disease status post four-vessel CABG (07/27/2022 LIMA-LAD, SVG-diagonal, SVG-OM, and SVG-PDA), thoracoabdominal aortic aneurysm status post repair [facility]), hypertension, hyperlipidemia, hypothyroidism, who presents today as a work in for atypical chest discomfort.  Patient has a longstanding history of enlarged thoracic abdominal aortic aneurysm measuring up to 5.7 cm and just recently underwent endovascular repair at [facility].  Echo performed at [facility] health/2/24 showed EF greater than 55%, mild aortic insufficiency.  Carotid artery ultrasound at that time showed less than 50% bilateral ICA stenosis.  She underwent coronary CTA on 07/12/22 which demonstrated a calcium  score of 1361 which was 97 percentile for age and sex matched control.  There was significant dilatation of the descending thoracic aorta measured 56 mm in diam with severe mural atheroma.  The RCA was dominant with a CTO of the proximal to mid segment, 50 to 69% proximal LAD stenosis, and 25 to 49% proximal nondominant left circumflex stenosis.  CT FFR was significant in the mid LAD and left circumflex.  Left heart catheterization on 07/23/2022 showed severe three-vessel coronary disease including sequential 70-90% proximal and mid LAD stenosis with aneurysmal segment at the takeoff of the large D1 branch, 70 to 80% proximal/mid left circumflex stenosis and CTO of the mid RCA with left-to-right collaterals.  Mildly elevated LVEDP estimated at 22 mmHg.  She was subsequently seen and evaluated by CVTS and underwent a four-vessel CABG on 07/27/2022.  Post CABG course was largely uncomplicated with expected postoperative volume overload and blood loss anemia.  She was  diuresed.  She suffered from hypoxia with oxygen saturations in the mid 80s with ambulation and was discharged on supplemental oxygen therapy.  Postoperative follow-up she suffered from weight gain and was started on furosemide  40 mg daily with potassium supplementation.  Follow-up with CVTS noted improvement in her shortness of breath and lower extremity swelling and furosemide  was discontinued.  Chest x-ray at that time showed small bilateral pleural effusion with significant improvement in effusion when compared to prior study.  She complained of burning along her incision site and was advised to use Mederma.  In 09/21/2022 when she was seen in clinic weight was down 8 pounds, lower extremity swelling have resolved, and she underwent nocturnal orthostatics which was suggestive of POTS.  I will after oxygenation.  She then followed up with [facility] vascular surgery and was recommended for TEVAR which was completed on 11/12/22.  Postoperative.  Was uneventful on postop day 1 her home carvedilol  was restarted.  Postop day 2 she continued to transition as expected.  To be discharged from the facility on 11/15/2022.  Pain was controlled at that time with oral medications.  She was able to ambulate and void without complications.  Patient was a moderate risk of spinal cord ischemia and her home amlodipine  was advised to be held until follow-up.  [facility]23/2022 with complaint of back pain.  She was having the same pain pain for the last several days.  She had talked with the surgeon who recommended that she be evaluated in the emergency department.  Blood pressure was 138/87, pulse of 78, respiration of 20, temperature of 98.  Pertinent  labs are hemoglobin of 11, blood glucose of 102, AST of 11.  EKG reveals sinus rhythm with a rate of 70.  Sensitivity troponin was normal.  CTA of the chest/abdomen/umbilicus showed postoperative changes from recent stent graft repair of the descending  thoracic aortic aneurysm with signs of endoleak at the level of the distal portion of the graft secondary to incomplete distal apposition of the stent graft.  Proximal portion of the aneurysm sac measured 5.9 cm, formally 5.5 cm.  The distal thoracic aorta aneurysm measures 5.5 cm formally 5.4 cm.  Aneurysmal dilatation of the visceral segment of the abdominal aorta with a maximal diameter of 4.5 cm, 4.4 0.1 cm.  Mild focal aneurysmal dilatation of the infrarenal segment of the abdominal aorta with maximum diameter 3.4 cm, previously 3.2 cm.  No signs of acute aortic dissection.  [facility] vascular fellow discussed elevated blood pressures of 166/101 and 171/98.  They reported they would allow permissive hypertension and would just like it to be under 180.  They did not want her to restart her amlodipine  but continue her carvedilol  and they would continue to keep a close follow-up.  ED provider followed up with [facility] vascular with the expectation that a small endoleak was possible at this point.  Patient decided that she wanted to follow closely outpatient understands return precautions specifically worsening pain, lightheadedness, chest pain, severe abdominal pain that she should return for evaluation overall she was prescribed tramadol  and Zofran  and felt comfortable with the plan was subsequently discharged home.  She followed up with her primary care provider 11/27/2022 with continued complaints of postoperative pain.  Blood work was rechecked for anemia where hemoglobin was noted to be stable at 10.8.  She continues to have right-sided chest wall pain seems mostly related to her recent surgery but will continue monitoring.  She was continued on tramadol  every 6 hours as needed.  Patient was seen today, in the office at [facility] by vascular earlier today with complaints of vague chest pain and back pain.  She was offered admittance to the hospital with cardiovascular follow-up and continued workup for her symptoms.   Unfortunately she declined and opted to try to be seen in clinic today.  She returns to clinic today accompanied by her husband with continued chest wall pain to the right side of her chest that she describes as a sharp stabbing type pain not associated with exertion.  She endorses fatigue, shortness of breath, and overall just feeling well.  She stated she is unsure if it is muscular pain or its nerve pain.  States she was advised on her appointment today given something that could be nerve pain could likely be prescribed gabapentin for the discomfort.  She denies any peripheral edema and is no longer on fluid medication.  She states that she was advised that they would like to admit her to the facility for further workup to rule out cardiac nature of all of her discomfort.  She states she was also told that recent CT that she had postoperatively was okay but her second procedure that was scheduled in the end of September was moved up to the beginning of September.  ROS: 10 point review of systems has been reviewed and considered negative with exception of what is been listed in the HPI  Studies Reviewed: SABRA   EKG Interpretation Date/Time:  Thursday November 29 2022 14:22:04 EDT Ventricular Rate:  67 PR Interval:  156 QRS Duration:  90 QT Interval:  410 QTC  Calculation: 433 R Axis:   76  Text Interpretation: Normal sinus rhythm Possible Left atrial enlargement Nonspecific T wave abnormality When compared with ECG of 23-Nov-2022 09:49, PREVIOUS ECG IS PRESENT Confirmed by Gerard Frederick ([id number]) on 11/29/2022 2:35:14 PM    2D echo 07/23/2022: 1. Left ventricular ejection fraction, by estimation, is 55 to 60%. The  left ventricle has normal function. The left ventricle has no regional  wall motion abnormalities. There is mild concentric left ventricular  hypertrophy. Left ventricular diastolic  parameters are indeterminate.   2. Right ventricular systolic function is normal. The right ventricular   size is normal.   3. The mitral valve is normal in structure. Trivial mitral valve  regurgitation. No evidence of mitral stenosis.   4. The aortic valve is normal in structure. Aortic valve regurgitation is  not visualized. No aortic stenosis is present.   5. Abodminal aorta is dilated ( 3.3cm).   6. The inferior vena cava is normal in size with greater than 50%  respiratory variability, suggesting right atrial pressure of 3 mmHg.   7. Evidence of atrial level shunting detected by color flow Doppler.  Small secundum ASD noted 1.20 cm.  __________   LHC/15/2024: Conclusions: Severe three-vessel coronary artery disease including sequential 70-90% proximal and mid LAD stenoses with aneurysmal segment at the takeoff of large first diagonal branch, 70-80% proximal/mid LCx stenosis, and chronic total occlusion of mid RCA with left-to-right collaterals. Mildly elevated left ventricular filling pressure (LVEDP 22 mmHg).  LVEF known to be normal based on recent echocardiogram at [facility].   Recommendations: Admit for antianginal therapy, as progressive atypical left-sided chest pain is most likely the patient's anginal equivalent, as well as cardiac surgery consultation for CABG. Aggressive secondary prevention of coronary artery disease. __________   Coronary CTA with CT FFR/07/2022: IMPRESSION: 1. Descending thoracic aortic aneurysm this should be followed up with CTA of the chest for complete evaluation. 2. Otherwise no significant extracardiac incidental findings were seen.   FINDINGS: Aorta: Severe dilation of descending thoracic aorta, 56 mm in diameter, with severe mural atheroma. No dissection.   Aortic Valve:  Trileaflet. mild calcifications.   Coronary Arteries:  Normal coronary origin.  Right dominance.   RCA is a dominant artery. There is calcified and non calcified plaque proximally causing total occlusion in the proximal-mid segment (100%).   Left main gives rise to LAD and  LCX arteries. LM has no disease.   LAD has calcified plaque proximally causing moderate stenosis (50-69%).   LCX is a non-dominant artery. There is calcified plaque in the proximal segment causing mild stenosis (25-49%).   Other findings:   Normal pulmonary vein drainage into the left atrium.   Normal left atrial appendage without a thrombus.   Normal size of the pulmonary artery.   IMPRESSION: 1. Coronary calcium  score of 1361. This was 99th percentile for age and sex matched control. 2. Normal coronary origin with right dominance. 3. CTO of proximal to mid RCA. 4. Moderate proximal LAD stenosis (50-69%). 5. Mild proximal LCx stenosis (25-49%). 6. Known severe dilation of descending thoracic aorta, 56 mm in diameter, with severe mural atheroma. 7. CAD-RADS 5 Total coronary occlusion (100%). Consider cardiac catheterization or viability assessment. Consider symptom-guided anti-ischemic pharmacotherapy as well as risk factor modification per guideline directed care. 8. Will submit CT FFR to analyze proximal LAD. Will be reported separately.   1. Left Main:  No significant stenosis.   2. LAD: significant stenosis in the mid LAD.  FFRct  0.71 3. LCX: significant stenosis in the mid LCx.  FFRct 0.77 4. RCA: Total occlusion of mid RCA.   IMPRESSION: 1. CT FFR analysis showed significant stenosis in the mid LAD and LCx. 2.  Total occlusion of mid RCA. 3.  Cardiac catheterization recommended. Risk Assessment/Calculations:         Physical Exam:   VS:  BP (!) 164/78 (BP Location: Left Arm, Patient Position: Sitting, Cuff Size: Normal)   Pulse 67   Ht 5' (1.524 m)   Wt 157 lb 6.4 oz (71.4 kg)   SpO2 94%   BMI 30.74 kg/m    Wt Readings from Last 3 Encounters:  11/29/22 157 lb 6.4 oz (71.4 kg)  11/06/22 158 lb 6.4 oz (71.8 kg)  09/21/22 163 lb 12.8 oz (74.3 kg)    GEN: Well nourished, well developed in no acute distress NECK: No JVD; No carotid bruits CARDIAC: RRR, no  murmurs, rubs, gallops, well healed mid-sternal scar and CT removal site X 3 RESPIRATORY:  Clear to auscultation without rales, wheezing or rhonchi  ABDOMEN: Soft, non-tender, non-distended EXTREMITIES:  No edema; No deformity   ASSESSMENT AND PLAN: .   Coronary artery disease status post CABG with atypical chest discomfort.  She has yet to start cardiac rehab as she is gone from 1 procedure to the next 2 with vascular and has an upcoming procedure in Sept. she describes a substernal and right breast discomfort that is a sharp stabbing pain.  EKG was unchanged and today revealing sinus rhythm with a rate of 67.  Recent emergency department visit workup was unrevealing with high-sensitivity troponin of 5.  With continued complaints and concerns for chest discomfort she has been scheduled for Lexiscan  Myoview  to rule out any ischemic cause.  She has been advised that if her testing comes back abnormal and that it may delay any further vascular procedure that she needs to have completed.  She is also being restarted on amlodipine  2.5 mg daily for antianginal effect.  This was discussed with the DOD in clinic today Dr. Anner as well as the vascular nurse practitioner at [facility].  She is also continued on aspirin  325 mg daily and rosuvastatin  20 mg daily.  Thoracoabdominal aortic aneurysm status post TEVAR.  Continued discomfort.  She continues to be followed by [facility] vascular.  Recent CT showed postoperative changes from recent stent graft repair of the descending thoracic aortic aneurysm signs of endoleak at the level of the distal portion of the graft secondary to incomplete distal apposition of the stent graft.  Proximal portion of the aneurysm sac measures 5.9 cm, formally 5.5 cm, distal thoracic aortic aneurysm measuring 5.5 cm, formally 5.4 cm.  Aneurysmal dilatation of the visceral segment of the abdominal aorta with a maximal diameter of 4.5 cm formally 4.1 cm.  Mild focal aneurysm dilatation of the  infrarenal segment of the abdominal aorta with maximum diameter of 3.4 cm previously 3.2 cm.  There were no signs of acute aortic dissection at the time.  She is just recently followed up with vascular at [facility] earlier today and her second procedure has been moved up from the end of September to the beginning September.  Hypertension with blood pressure today 164/78.  Patient's blood pressure has been running higher even with a recent visit to the emergency department.  She is continued on carvedilol  12.5 mg twice daily.  She is also being restarted on amlodipine  2.5 mg daily for antianginal effect.  Mixed hyperlipidemia with an LDL  of 70 on 10/03/2022.  Patient is currently on rosuvastatin  20 mg daily.  On return would recommend increasing rosuvastatin  to 40 mg daily or adding 10 mg of ezetimibe  as goal for LDL is 55.  Hypothyroidism last TSH 4.2 which she is continued on Synthroid .  This continues to be managed by her PCP.    Informed Consent   Shared Decision Making/Informed Consent The risks [chest pain, shortness of breath, cardiac arrhythmias, dizziness, blood pressure fluctuations, myocardial infarction, stroke/transient ischemic attack, nausea, vomiting, allergic reaction, radiation exposure, metallic taste sensation and life-threatening complications (estimated to be 1 in 10,000)], benefits (risk stratification, diagnosing coronary artery disease, treatment guidance) and alternatives of a nuclear stress test were discussed in detail with Kaylee Pope and she agrees to proceed.  Patient was scheduled for Lexiscan  Myoview  but is also unsure whether she will keep this appointment or if she will return to [facility] and allow them to admit her to the facility for further cardiovascular workup.  Patient advised that she was told earlier today and that gabapentin may correct her symptoms.  Assured the patient that if the gabapentin would correct all of her symptoms then earlier today, [facility] could have prescribed  her a medication. She and her husband were both advised that medications come with side effects.  She has been encouraged to discuss her options with her family and decide what is going on over the most benefit. Either she can restart low dose amlodipine  at 2.5 mg daily and undergo the Lexiscan  or be admitted to [facility] for inpatient evaluation, but either away further work-up is advised.      Dispo: Patient return to clinic to see MD/APP after testing is completed or sooner if needed to reevaluate symptoms  Signed, Katyana Trolinger, NP

## 2022-11-29 NOTE — Progress Notes (Signed)
Cardiology Office Note:  .   Date:  11/29/2022  ID:  Kaylee Pope, DOB May 03, 1955, MRN [id number] PCP: Jerl Mina, MD  [facility] Providers Cardiologist:  Yvonne Kendall, MD    History of Present Illness: .   Kaylee Pope is a 67 y.o. female with history of coronary artery disease status post four-vessel CABG (07/27/2022 LIMA-LAD, SVG-diagonal, SVG-OM, and SVG-PDA), thoracoabdominal aortic aneurysm status post repair [facility]), hypertension, hyperlipidemia, hypothyroidism, who presents today as a work in for atypical chest discomfort.  Patient has a longstanding history of enlarged thoracic abdominal aortic aneurysm measuring up to 5.7 cm and just recently underwent endovascular repair at [facility].  Echo performed at [facility] health/2/24 showed EF greater than 55%, mild aortic insufficiency.  Carotid artery ultrasound at that time showed less than 50% bilateral ICA stenosis.  She underwent coronary CTA on 07/12/22 which demonstrated a calcium score of 1361 which was 97 percentile for age and sex matched control.  There was significant dilatation of the descending thoracic aorta measured 56 mm in diam with severe mural atheroma.  The RCA was dominant with a CTO of the proximal to mid segment, 50 to 69% proximal LAD stenosis, and 25 to 49% proximal nondominant left circumflex stenosis.  CT FFR was significant in the mid LAD and left circumflex.  Left heart catheterization on 07/23/2022 showed severe three-vessel coronary disease including sequential 70-90% proximal and mid LAD stenosis with aneurysmal segment at the takeoff of the large D1 branch, 70 to 80% proximal/mid left circumflex stenosis and CTO of the mid RCA with left-to-right collaterals.  Mildly elevated LVEDP estimated at 22 mmHg.  She was subsequently seen and evaluated by CVTS and underwent a four-vessel CABG on 07/27/2022.  Post CABG course was largely uncomplicated with expected postoperative volume overload and blood loss anemia.  She was  diuresed.  She suffered from hypoxia with oxygen saturations in the mid 80s with ambulation and was discharged on supplemental oxygen therapy.  Postoperative follow-up she suffered from weight gain and was started on furosemide 40 mg daily with potassium supplementation.  Follow-up with CVTS noted improvement in her shortness of breath and lower extremity swelling and furosemide was discontinued.  Chest x-ray at that time showed small bilateral pleural effusion with significant improvement in effusion when compared to prior study.  She complained of burning along her incision site and was advised to use Mederma.  In 09/21/2022 when she was seen in clinic weight was down 8 pounds, lower extremity swelling have resolved, and she underwent nocturnal orthostatics which was suggestive of POTS.  I will after oxygenation.  She then followed up with [facility] vascular surgery and was recommended for TEVAR which was completed on 11/12/22.  Postoperative.  Was uneventful on postop day 1 her home carvedilol was restarted.  Postop day 2 she continued to transition as expected.  To be discharged from the facility on 11/15/2022.  Pain was controlled at that time with oral medications.  She was able to ambulate and void without complications.  Patient was a moderate risk of spinal cord ischemia and her home amlodipine was advised to be held until follow-up.  She presented to the [facility] emergency department on 11/23/2022 with complaint of back pain.  She was having the same pain pain for the last several days.  She had talked with the surgeon who recommended that she be evaluated in the emergency department.  Blood pressure was 138/87, pulse of 78, respiration of 20, temperature of 98.  Pertinent  labs are hemoglobin of 11, blood glucose of 102, AST of 11.  EKG reveals sinus rhythm with a rate of 70.  Sensitivity troponin was normal.  CTA of the chest/abdomen/umbilicus showed postoperative changes from recent stent graft repair of the descending  thoracic aortic aneurysm with signs of endoleak at the level of the distal portion of the graft secondary to incomplete distal apposition of the stent graft.  Proximal portion of the aneurysm sac measured 5.9 cm, formally 5.5 cm.  The distal thoracic aorta aneurysm measures 5.5 cm formally 5.4 cm.  Aneurysmal dilatation of the visceral segment of the abdominal aorta with a maximal diameter of 4.5 cm, 4.4 0.1 cm.  Mild focal aneurysmal dilatation of the infrarenal segment of the abdominal aorta with maximum diameter 3.4 cm, previously 3.2 cm.  No signs of acute aortic dissection.  [facility] vascular fellow discussed elevated blood pressures of 166/101 and 171/98.  They reported they would allow permissive hypertension and would just like it to be under 180.  They did not want her to restart her amlodipine but continue her carvedilol and they would continue to keep a close follow-up.  ED provider followed up with [facility] vascular with the expectation that a small endoleak was possible at this point.  Patient decided that she wanted to follow closely outpatient understands return precautions specifically worsening pain, lightheadedness, chest pain, severe abdominal pain that she should return for evaluation overall she was prescribed tramadol and Zofran and felt comfortable with the plan was subsequently discharged home.  She followed up with her primary care provider 11/27/2022 with continued complaints of postoperative pain.  Blood work was rechecked for anemia where hemoglobin was noted to be stable at 10.8.  She continues to have right-sided chest wall pain seems mostly related to her recent surgery but will continue monitoring.  She was continued on tramadol every 6 hours as needed.  Patient was seen today, in the office at [facility] by vascular earlier today with complaints of vague chest pain and back pain.  She was offered admittance to the hospital with cardiovascular follow-up and continued workup for her symptoms.   Unfortunately she declined and opted to try to be seen in clinic today.  She returns to clinic today accompanied by her husband with continued chest wall pain to the right side of her chest that she describes as a sharp stabbing type pain not associated with exertion.  She endorses fatigue, shortness of breath, and overall just feeling well.  She stated she is unsure if it is muscular pain or its nerve pain.  States she was advised on her appointment today given something that could be nerve pain could likely be prescribed gabapentin for the discomfort.  She denies any peripheral edema and is no longer on fluid medication.  She states that she was advised that they would like to admit her to the facility for further workup to rule out cardiac nature of all of her discomfort.  She states she was also told that recent CT that she had postoperatively was okay but her second procedure that was scheduled in the end of September was moved up to the beginning of September.  ROS: 10 point review of systems has been reviewed and considered negative with exception of what is been listed in the HPI  Studies Reviewed: Marland Kitchen   EKG Interpretation Date/Time:  Thursday November 29 2022 14:22:04 EDT Ventricular Rate:  67 PR Interval:  156 QRS Duration:  90 QT Interval:  410 QTC  Calculation: 433 R Axis:   76  Text Interpretation: Normal sinus rhythm Possible Left atrial enlargement Nonspecific T wave abnormality When compared with ECG of 23-Nov-2022 09:49, PREVIOUS ECG IS PRESENT Confirmed by Charlsie Quest ([id number]) on 11/29/2022 2:35:14 PM    2D echo 07/23/2022: 1. Left ventricular ejection fraction, by estimation, is 55 to 60%. The  left ventricle has normal function. The left ventricle has no regional  wall motion abnormalities. There is mild concentric left ventricular  hypertrophy. Left ventricular diastolic  parameters are indeterminate.   2. Right ventricular systolic function is normal. The right ventricular   size is normal.   3. The mitral valve is normal in structure. Trivial mitral valve  regurgitation. No evidence of mitral stenosis.   4. The aortic valve is normal in structure. Aortic valve regurgitation is  not visualized. No aortic stenosis is present.   5. Abodminal aorta is dilated ( 3.3cm).   6. The inferior vena cava is normal in size with greater than 50%  respiratory variability, suggesting right atrial pressure of 3 mmHg.   7. Evidence of atrial level shunting detected by color flow Doppler.  Small secundum ASD noted 1.20 cm.  __________   LHC/15/2024: Conclusions: Severe three-vessel coronary artery disease including sequential 70-90% proximal and mid LAD stenoses with aneurysmal segment at the takeoff of large first diagonal branch, 70-80% proximal/mid LCx stenosis, and chronic total occlusion of mid RCA with left-to-right collaterals. Mildly elevated left ventricular filling pressure (LVEDP 22 mmHg).  LVEF known to be normal based on recent echocardiogram at [facility].   Recommendations: Admit for antianginal therapy, as progressive atypical left-sided chest pain is most likely the patient's anginal equivalent, as well as cardiac surgery consultation for CABG. Aggressive secondary prevention of coronary artery disease. __________   Coronary CTA with CT FFR/07/2022: IMPRESSION: 1. Descending thoracic aortic aneurysm this should be followed up with CTA of the chest for complete evaluation. 2. Otherwise no significant extracardiac incidental findings were seen.   FINDINGS: Aorta: Severe dilation of descending thoracic aorta, 56 mm in diameter, with severe mural atheroma. No dissection.   Aortic Valve:  Trileaflet. mild calcifications.   Coronary Arteries:  Normal coronary origin.  Right dominance.   RCA is a dominant artery. There is calcified and non calcified plaque proximally causing total occlusion in the proximal-mid segment (100%).   Left main gives rise to LAD and  LCX arteries. LM has no disease.   LAD has calcified plaque proximally causing moderate stenosis (50-69%).   LCX is a non-dominant artery. There is calcified plaque in the proximal segment causing mild stenosis (25-49%).   Other findings:   Normal pulmonary vein drainage into the left atrium.   Normal left atrial appendage without a thrombus.   Normal size of the pulmonary artery.   IMPRESSION: 1. Coronary calcium score of 1361. This was 99th percentile for age and sex matched control. 2. Normal coronary origin with right dominance. 3. CTO of proximal to mid RCA. 4. Moderate proximal LAD stenosis (50-69%). 5. Mild proximal LCx stenosis (25-49%). 6. Known severe dilation of descending thoracic aorta, 56 mm in diameter, with severe mural atheroma. 7. CAD-RADS 5 Total coronary occlusion (100%). Consider cardiac catheterization or viability assessment. Consider symptom-guided anti-ischemic pharmacotherapy as well as risk factor modification per guideline directed care. 8. Will submit CT FFR to analyze proximal LAD. Will be reported separately.   1. Left Main:  No significant stenosis.   2. LAD: significant stenosis in the mid LAD.  FFRct  0.71 3. LCX: significant stenosis in the mid LCx.  FFRct 0.77 4. RCA: Total occlusion of mid RCA.   IMPRESSION: 1. CT FFR analysis showed significant stenosis in the mid LAD and LCx. 2.  Total occlusion of mid RCA. 3.  Cardiac catheterization recommended. Risk Assessment/Calculations:         Physical Exam:   VS:  BP (!) 164/78 (BP Location: Left Arm, Patient Position: Sitting, Cuff Size: Normal)   Pulse 67   Ht 5' (1.524 m)   Wt 157 lb 6.4 oz (71.4 kg)   SpO2 94%   BMI 30.74 kg/m    Wt Readings from Last 3 Encounters:  11/29/22 157 lb 6.4 oz (71.4 kg)  11/06/22 158 lb 6.4 oz (71.8 kg)  09/21/22 163 lb 12.8 oz (74.3 kg)    GEN: Well nourished, well developed in no acute distress NECK: No JVD; No carotid bruits CARDIAC: RRR, no  murmurs, rubs, gallops, well healed mid-sternal scar and CT removal site X 3 RESPIRATORY:  Clear to auscultation without rales, wheezing or rhonchi  ABDOMEN: Soft, non-tender, non-distended EXTREMITIES:  No edema; No deformity   ASSESSMENT AND PLAN: .   Coronary artery disease status post CABG with atypical chest discomfort.  She has yet to start cardiac rehab as she is gone from 1 procedure to the next 2 with vascular and has an upcoming procedure in Sept. she describes a substernal and right breast discomfort that is a sharp stabbing pain.  EKG was unchanged and today revealing sinus rhythm with a rate of 67.  Recent emergency department visit workup was unrevealing with high-sensitivity troponin of 5.  With continued complaints and concerns for chest discomfort she has been scheduled for Lexiscan Myoview to rule out any ischemic cause.  She has been advised that if her testing comes back abnormal and that it may delay any further vascular procedure that she needs to have completed.  She is also being restarted on amlodipine 2.5 mg daily for antianginal effect.  This was discussed with the DOD in clinic today Dr. Herbie Baltimore as well as the vascular nurse practitioner at [facility].  She is also continued on aspirin 325 mg daily and rosuvastatin 20 mg daily.  Thoracoabdominal aortic aneurysm status post TEVAR.  Continued discomfort.  She continues to be followed by [facility] vascular.  Recent CT showed postoperative changes from recent stent graft repair of the descending thoracic aortic aneurysm signs of endoleak at the level of the distal portion of the graft secondary to incomplete distal apposition of the stent graft.  Proximal portion of the aneurysm sac measures 5.9 cm, formally 5.5 cm, distal thoracic aortic aneurysm measuring 5.5 cm, formally 5.4 cm.  Aneurysmal dilatation of the visceral segment of the abdominal aorta with a maximal diameter of 4.5 cm formally 4.1 cm.  Mild focal aneurysm dilatation of the  infrarenal segment of the abdominal aorta with maximum diameter of 3.4 cm previously 3.2 cm.  There were no signs of acute aortic dissection at the time.  She is just recently followed up with vascular at [facility] earlier today and her second procedure has been moved up from the end of September to the beginning September.  Hypertension with blood pressure today 164/78.  Patient's blood pressure has been running higher even with a recent visit to the emergency department.  She is continued on carvedilol 12.5 mg twice daily.  She is also being restarted on amlodipine 2.5 mg daily for antianginal effect.  Mixed hyperlipidemia with an LDL  of 70 on 10/03/2022.  Patient is currently on rosuvastatin 20 mg daily.  On return would recommend increasing rosuvastatin to 40 mg daily or adding 10 mg of ezetimibe as goal for LDL is 55.  Hypothyroidism last TSH 4.2 which she is continued on Synthroid.  This continues to be managed by her PCP.    Informed Consent   Shared Decision Making/Informed Consent The risks [chest pain, shortness of breath, cardiac arrhythmias, dizziness, blood pressure fluctuations, myocardial infarction, stroke/transient ischemic attack, nausea, vomiting, allergic reaction, radiation exposure, metallic taste sensation and life-threatening complications (estimated to be 1 in 10,000)], benefits (risk stratification, diagnosing coronary artery disease, treatment guidance) and alternatives of a nuclear stress test were discussed in detail with Ms. Szafran and she agrees to proceed.  Patient was scheduled for [facility] but is also unsure whether she will keep this appointment or if she will return to [facility] and allow them to admit her to the facility for further cardiovascular workup.  Patient advised that she was told earlier today and that gabapentin may correct her symptoms.  Assured the patient that if the gabapentin would correct all of her symptoms then earlier today, [facility] could have prescribed  her a medication. She and her husband were both advised that medications come with side effects.  She has been encouraged to discuss her options with her family and decide what is going on over the most benefit. Either she can restart low dose amlodipine at 2.5 mg daily and undergo the Lexiscan or be admitted to [facility] for inpatient evaluation, but either away further work-up is advised.      Dispo: Patient return to clinic to see MD/APP after testing is completed or sooner if needed to reevaluate symptoms  Signed, Thatcher Doberstein, NP

## 2022-11-29 NOTE — Patient Instructions (Signed)
Medication Instructions:  Will call regarding medication changes per your provider  *If you need a refill on your cardiac medications before your next appointment, please call your pharmacy*   Lab Work: none If you have labs (blood work) drawn today and your tests are completely normal, you will receive your results only by: MyChart Message (if you have MyChart) OR A paper copy in the mail If you have any lab test that is abnormal or we need to change your treatment, we will call you to review the results.   Testing/Procedures: Your provider has ordered a Lexiscan/ Exercise Myoview Stress test. This will take place at [facility]. Please report to the [facility] medical mall entrance. The volunteers at the first desk will direct you where to go.  [facility] MYOVIEW  Your provider has ordered a Stress Test with nuclear imaging. The purpose of this test is to evaluate the blood supply to your heart muscle. This procedure is referred to as a "Non-Invasive Stress Test." This is because other than having an IV started in your vein, nothing is inserted or "invades" your body. Cardiac stress tests are done to find areas of poor blood flow to the heart by determining the extent of coronary artery disease (CAD). Some patients exercise on a treadmill, which naturally increases the blood flow to your heart, while others who are unable to walk on a treadmill due to physical limitations will have a pharmacologic/chemical stress agent called Lexiscan . This medicine will mimic walking on a treadmill by temporarily increasing your coronary blood flow.   Please note: these test may take anywhere between 2-4 hours to complete  How to prepare for your Myoview test:  Nothing to eat for 6 hours prior to the test No caffeine for 24 hours prior to test No smoking 24 hours prior to test. Your medication may be taken with water.  If your doctor stopped a medication because of this test, do not take that medication. Ladies, please  do not wear dresses.  Skirts or pants are appropriate. Please wear a short sleeve shirt. No perfume, cologne or lotion. Wear comfortable walking shoes. No heels!   PLEASE NOTIFY THE OFFICE AT LEAST 24 HOURS IN ADVANCE IF YOU ARE UNABLE TO KEEP YOUR APPOINTMENT.  [phone] AND  PLEASE NOTIFY NUCLEAR MEDICINE AT [facility] AT LEAST 24 HOURS IN ADVANCE IF YOU ARE UNABLE TO KEEP YOUR APPOINTMENT. [phone]    Follow-Up: At [facility], you and your health needs are our priority.  As part of our continuing mission to provide you with exceptional heart care, we have created designated Provider Care Teams.  These Care Teams include your primary Cardiologist (physician) and Advanced Practice Providers (APPs -  Physician Assistants and Nurse Practitioners) who all work together to provide you with the care you need, when you need it.  We recommend signing up for the patient portal called "MyChart".  Sign up information is provided on this After Visit Summary.  MyChart is used to connect with patients for Virtual Visits (Telemedicine).  Patients are able to view lab/test results, encounter notes, upcoming appointments, etc.  Non-urgent messages can be sent to your provider as well.   To learn more about what you can do with MyChart, go to [email or web address].    Your next appointment:   F/u after Lexiscan  Provider:   Charlsie Quest, NP

## 2022-12-03 ENCOUNTER — Encounter: Payer: Self-pay | Admitting: Internal Medicine

## 2022-12-06 ENCOUNTER — Telehealth: Payer: Self-pay | Admitting: Internal Medicine

## 2022-12-06 NOTE — Telephone Encounter (Signed)
Patient wants call back to confirm she can take her medications prior to NM MYO MULT SPECT W/WALL test tomorrow.

## 2022-12-06 NOTE — Telephone Encounter (Signed)
Pt called questioning if she is required to hold any medication piror to her lexiscan. Per chart review, pt informed she is ok to take all medication as prescribed prior to procedure.  Pt verbalized understanding.

## 2022-12-07 ENCOUNTER — Encounter: Admission: RE | Admit: 2022-12-07 | Payer: Medicare HMO | Source: Ambulatory Visit

## 2022-12-07 DIAGNOSIS — R072 Precordial pain: Secondary | ICD-10-CM

## 2022-12-07 DIAGNOSIS — R079 Chest pain, unspecified: Secondary | ICD-10-CM | POA: Insufficient documentation

## 2022-12-07 LAB — NM MYOCAR MULTI W/SPECT W/WALL MOTION / EF
Base ST Depression (mm): 0 mm
LV dias vol: 72 mL (ref 46–106)
LV sys vol: 28 mL
MPHR: 152 {beats}/min
Nuc Stress EF: 61 %
Peak HR: 90 {beats}/min
Percent HR: 59 %
Rest HR: 68 {beats}/min
Rest Nuclear Isotope Dose: 9.9 mCi
SDS: 1
SRS: 1
SSS: 0
ST Depression (mm): 0 mm
Stress Nuclear Isotope Dose: 30.3 mCi
TID: 0.91

## 2022-12-07 MED ORDER — TECHNETIUM TC 99M TETROFOSMIN IV KIT
30.0000 | PACK | Freq: Once | INTRAVENOUS | Status: AC
  Administered 2022-12-07: 30.29 via INTRAVENOUS

## 2022-12-07 MED ORDER — REGADENOSON 0.4 MG/5ML IV SOLN
0.4000 mg | Freq: Once | INTRAVENOUS | Status: DC
  Filled 2022-12-07: qty 5

## 2022-12-07 MED ORDER — TECHNETIUM TC 99M TETROFOSMIN IV KIT
10.0000 | PACK | Freq: Once | INTRAVENOUS | Status: AC | PRN
  Administered 2022-12-07: 9.93 via INTRAVENOUS

## 2022-12-07 MED ORDER — REGADENOSON 0.4 MG/5ML IV SOLN
0.4000 mg | Freq: Once | INTRAVENOUS | Status: AC
  Administered 2022-12-07: 0.4 mg via INTRAVENOUS

## 2022-12-11 ENCOUNTER — Telehealth: Payer: Self-pay | Admitting: Internal Medicine

## 2022-12-11 ENCOUNTER — Encounter: Payer: Self-pay | Admitting: Internal Medicine

## 2022-12-11 NOTE — Telephone Encounter (Signed)
Patient's husband is requesting a call back to discuss stress test results.

## 2022-12-11 NOTE — Telephone Encounter (Signed)
Advised patients husband that we are waiting for the provider to read the results and make recommendations.

## 2022-12-12 ENCOUNTER — Other Ambulatory Visit: Payer: Self-pay | Admitting: Cardiology

## 2022-12-12 NOTE — Telephone Encounter (Signed)
With the test results being abnormal it may be more beneficial to revisit causes of chest pain prior to continuing with the upcoming vascular surgery. She would likely benefit from a repeat heart catheterization. We can set that up for her or have her come back in to discuss further.

## 2022-12-12 NOTE — Telephone Encounter (Signed)
Patient scheduled for [facility] on 9/10 at 12:30 with Dr. Okey Dupre. Patient given instructions and verbalized understanding. No further questions at this time. Instructions sent via mychart as well.

## 2022-12-13 ENCOUNTER — Telehealth: Payer: Self-pay | Admitting: Internal Medicine

## 2022-12-13 DIAGNOSIS — R079 Chest pain, unspecified: Secondary | ICD-10-CM

## 2022-12-13 NOTE — Addendum Note (Signed)
Addended by: Parke Poisson on: 12/13/2022 02:12 PM   Modules accepted: Orders

## 2022-12-13 NOTE — Telephone Encounter (Signed)
As requested by Dr. Okey Dupre, cath scheduled for 12/18/22 @ [facility] cancelled and rescheduled for 12/17/22. Pt provided pre-cath instructions listed below. Instructions also uploaded to mychart for review.  ECHO and labs scheduled for tomorrow 12/14/22 @ 3 pm.   [facility] A DEPT OF [place]. [facility] AT [facility] [address] [place], [place] [address] [place] [address] Dept: [phone] Loc: [phone]  Kaylee Pope  12/13/2022  You are scheduled for a Cardiac Catheterization on Monday, September 9 with Dr. Cristal De[place].  1. Please arrive at the [facility] (Main Entrance A) at [facility]: [address] [place], [place] [address] at 7:00 AM (This time is 1 hour(s) before your procedure to ensure your preparation). Free valet parking service is available. You will check in at ADMITTING. The support person will be asked to wait in the waiting room.  It is OK to have someone drop you off and come back when you are ready to be discharged.    Special note: Every effort is made to have your procedure done on time. Please understand that emergencies sometimes delay scheduled procedures.  2. Diet: Do not eat solid foods after midnight.  The patient may have clear liquids until 5am upon the day of the procedure.  3. Labs: You will need to have blood drawn tomorrow 12/14/22  4. Medication instructions in preparation for your procedure:   Contrast Allergy: No   On the morning of your procedure, take your Aspirin 81 mg and any morning medicines NOT listed above.  You may use sips of water.  5. Plan to go home the same day, you will only stay overnight if medically necessary. 6. Bring a current list of your medications and current insurance cards. 7. You MUST have a responsible person to drive you home. 8. Someone MUST be with you the first 24 hours after you arrive home or your discharge will be delayed. 9. Please wear clothes that are easy to  get on and off and wear slip-on shoes.  Thank you for allowing [place] to care for you!   -- [facility]

## 2022-12-13 NOTE — Telephone Encounter (Signed)
Patient's husband is calling on behalf of the patient. They are requesting to have a call back from Dr. Okey Dupre because he know their current situation. Please advise.

## 2022-12-13 NOTE — Telephone Encounter (Signed)
I reviewed the recent stress test results with [name] and her husband by phone.  She continues to have intermittent chest pain, mostly on the right side when she lies down.  This began almost immediately after her TAAA intervention at [facility].  She has also noticed increased shortness of breath since then.  Her CTA C/A/P showed an endoleak but no other acute abnormalities.  Subsequent MPI showed small inferior partial reversible defect.  Though her symptoms are atypical, I believe the only way to definitively determine the cause of her chest pain is to repeat cardiac catheterization.  If it demonstrates stable native disease and patent grafts, she will need to revisit her pain with Dr. Pattricia Boss at [facility].  We have moved her cath up to 9 AM on 12/17/2022 at [facility].  I advised her to seek immediate medical attention if her chest pain worsens in the meantime. We will repeat a CBC and BMP tomorrow.  I will also attempt to arrange for an echocardiogram tomorrow.  Yvonne Kendall, MD [facility]

## 2022-12-14 ENCOUNTER — Ambulatory Visit: Payer: Medicare HMO | Attending: Internal Medicine

## 2022-12-14 DIAGNOSIS — R079 Chest pain, unspecified: Secondary | ICD-10-CM | POA: Diagnosis not present

## 2022-12-14 LAB — ECHOCARDIOGRAM LIMITED: Area-P 1/2: 4.39 cm2

## 2022-12-15 LAB — BASIC METABOLIC PANEL
BUN/Creatinine Ratio: 14 (ref 12–28)
BUN: 12 mg/dL (ref 8–27)
CO2: 27 mmol/L (ref 20–29)
Calcium: 8.6 mg/dL — ABNORMAL LOW (ref 8.7–10.3)
Chloride: 99 mmol/L (ref 96–106)
Creatinine, Ser: 0.85 mg/dL (ref 0.57–1.00)
Glucose: 104 mg/dL — ABNORMAL HIGH (ref 70–99)
Potassium: 3.8 mmol/L (ref 3.5–5.2)
Sodium: 139 mmol/L (ref 134–144)
eGFR: 75 mL/min/{1.73_m2} (ref >59)

## 2022-12-15 LAB — CBC
Hematocrit: 31.5 % — ABNORMAL LOW (ref 34.0–46.6)
Hemoglobin: 10 g/dL — ABNORMAL LOW (ref 11.1–15.9)
MCH: 27.5 pg (ref 26.6–33.0)
MCHC: 31.7 g/dL (ref 31.5–35.7)
MCV: 87 fL (ref 79–97)
Platelets: 257 10*3/uL (ref 150–450)
RBC: 3.63 x10E6/uL — ABNORMAL LOW (ref 3.77–5.28)
RDW: 14.1 % (ref 11.7–15.4)
WBC: 6.4 10*3/uL (ref 3.4–10.8)

## 2022-12-17 ENCOUNTER — Encounter (HOSPITAL_COMMUNITY)
Admission: RE | Disposition: A | Discharge status: Home / Self Care | Payer: Self-pay | Source: Ambulatory Visit | Attending: Internal Medicine

## 2022-12-17 ENCOUNTER — Ambulatory Visit (HOSPITAL_COMMUNITY)
Admission: RE | Admit: 2022-12-17 | Discharge: 2022-12-17 | Disposition: A | Discharge status: Home / Self Care | Payer: Medicare HMO | Source: Ambulatory Visit | Attending: Internal Medicine | Admitting: Internal Medicine

## 2022-12-17 DIAGNOSIS — Z79899 Other long term (current) drug therapy: Secondary | ICD-10-CM | POA: Diagnosis not present

## 2022-12-17 DIAGNOSIS — I25110 Atherosclerotic heart disease of native coronary artery with unstable angina pectoris: Secondary | ICD-10-CM | POA: Diagnosis not present

## 2022-12-17 DIAGNOSIS — E782 Mixed hyperlipidemia: Secondary | ICD-10-CM | POA: Diagnosis not present

## 2022-12-17 DIAGNOSIS — Z8679 Personal history of other diseases of the circulatory system: Secondary | ICD-10-CM | POA: Insufficient documentation

## 2022-12-17 DIAGNOSIS — Z951 Presence of aortocoronary bypass graft: Secondary | ICD-10-CM | POA: Insufficient documentation

## 2022-12-17 DIAGNOSIS — Z79890 Hormone replacement therapy (postmenopausal): Secondary | ICD-10-CM | POA: Insufficient documentation

## 2022-12-17 DIAGNOSIS — I2582 Chronic total occlusion of coronary artery: Secondary | ICD-10-CM | POA: Insufficient documentation

## 2022-12-17 DIAGNOSIS — R9439 Abnormal result of other cardiovascular function study: Secondary | ICD-10-CM | POA: Insufficient documentation

## 2022-12-17 DIAGNOSIS — E039 Hypothyroidism, unspecified: Secondary | ICD-10-CM | POA: Diagnosis not present

## 2022-12-17 DIAGNOSIS — I200 Unstable angina: Secondary | ICD-10-CM | POA: Diagnosis present

## 2022-12-17 DIAGNOSIS — I10 Essential (primary) hypertension: Secondary | ICD-10-CM | POA: Diagnosis not present

## 2022-12-17 DIAGNOSIS — Z7982 Long term (current) use of aspirin: Secondary | ICD-10-CM | POA: Insufficient documentation

## 2022-12-17 HISTORY — PX: LEFT HEART CATH AND CORS/GRAFTS ANGIOGRAPHY: CATH118250

## 2022-12-17 SURGERY — LEFT HEART CATH AND CORS/GRAFTS ANGIOGRAPHY
Anesthesia: LOCAL

## 2022-12-17 MED ORDER — LIDOCAINE HCL (PF) 1 % IJ SOLN
INTRAMUSCULAR | Status: DC | PRN
  Administered 2022-12-17: 5 mL via INTRADERMAL

## 2022-12-17 MED ORDER — VERAPAMIL HCL 2.5 MG/ML IV SOLN
INTRAVENOUS | Status: DC | PRN
  Administered 2022-12-17: 10 mL via INTRA_ARTERIAL

## 2022-12-17 MED ORDER — HYDRALAZINE HCL 20 MG/ML IJ SOLN: 10.0000 mg | INTRAMUSCULAR | Status: DC | PRN

## 2022-12-17 MED ORDER — HEPARIN SODIUM (PORCINE) 1000 UNIT/ML IJ SOLN
INTRAMUSCULAR | Status: DC | PRN
  Administered 2022-12-17: 3500 [IU] via INTRAVENOUS

## 2022-12-17 MED ORDER — LIDOCAINE HCL (PF) 1 % IJ SOLN
INTRAMUSCULAR | Status: AC
  Filled 2022-12-17: qty 30

## 2022-12-17 MED ORDER — LABETALOL HCL 5 MG/ML IV SOLN: 10.0000 mg | INTRAVENOUS | Status: DC | PRN

## 2022-12-17 MED ORDER — ASPIRIN 81 MG PO CHEW: 81.0000 mg | CHEWABLE_TABLET | ORAL | Status: DC

## 2022-12-17 MED ORDER — SODIUM CHLORIDE 0.9% FLUSH: 3.0000 mL | INTRAVENOUS | Status: DC | PRN

## 2022-12-17 MED ORDER — MIDAZOLAM HCL 2 MG/2ML IJ SOLN
INTRAMUSCULAR | Status: DC | PRN
  Administered 2022-12-17: 1 mg via INTRAVENOUS

## 2022-12-17 MED ORDER — SODIUM CHLORIDE 0.9 % IV SOLN: 250.0000 mL | INTRAVENOUS | Status: DC | PRN

## 2022-12-17 MED ORDER — HEPARIN SODIUM (PORCINE) 1000 UNIT/ML IJ SOLN
INTRAMUSCULAR | Status: AC
  Filled 2022-12-17: qty 10

## 2022-12-17 MED ORDER — FENTANYL CITRATE (PF) 100 MCG/2ML IJ SOLN
INTRAMUSCULAR | Status: AC
  Filled 2022-12-17: qty 2

## 2022-12-17 MED ORDER — SODIUM CHLORIDE 0.9 % IV SOLN: INTRAVENOUS | Status: DC

## 2022-12-17 MED ORDER — ISOSORBIDE MONONITRATE ER 30 MG PO TB24: 15.0000 mg | ORAL_TABLET | Freq: Every day | ORAL | 5 refills | Status: DC

## 2022-12-17 MED ORDER — MIDAZOLAM HCL 2 MG/2ML IJ SOLN
INTRAMUSCULAR | Status: AC
  Filled 2022-12-17: qty 2

## 2022-12-17 MED ORDER — VERAPAMIL HCL 2.5 MG/ML IV SOLN
INTRAVENOUS | Status: AC
  Filled 2022-12-17: qty 2

## 2022-12-17 MED ORDER — FUROSEMIDE 20 MG PO TABS: 20.0000 mg | ORAL_TABLET | Freq: Every day | ORAL | 5 refills | Status: DC

## 2022-12-17 MED ORDER — HEPARIN (PORCINE) IN NACL 1000-0.9 UT/500ML-% IV SOLN
INTRAVENOUS | Status: DC | PRN
  Administered 2022-12-17 (×2): 500 mL

## 2022-12-17 MED ORDER — ACETAMINOPHEN 325 MG PO TABS: 650.0000 mg | ORAL_TABLET | ORAL | Status: DC | PRN

## 2022-12-17 MED ORDER — FENTANYL CITRATE (PF) 100 MCG/2ML IJ SOLN
INTRAMUSCULAR | Status: DC | PRN
  Administered 2022-12-17: 25 ug via INTRAVENOUS

## 2022-12-17 MED ORDER — ONDANSETRON HCL 4 MG/2ML IJ SOLN: 4.0000 mg | Freq: Four times a day (QID) | INTRAMUSCULAR | Status: DC | PRN

## 2022-12-17 MED ORDER — SODIUM CHLORIDE 0.9% FLUSH: 3.0000 mL | Freq: Two times a day (BID) | INTRAVENOUS | Status: DC

## 2022-12-17 MED ORDER — IOHEXOL 350 MG/ML SOLN
INTRAVENOUS | Status: DC | PRN
  Administered 2022-12-17: 65 mL

## 2022-12-17 SURGICAL SUPPLY — 12 items
CATH INFINITI 5 FR IM (CATHETERS) IMPLANT
CATH INFINITI 5FR AL1 (CATHETERS) IMPLANT
CATH INFINITI 5FR MULTPACK ANG (CATHETERS) IMPLANT
DEVICE RAD COMP TR BAND LRG (VASCULAR PRODUCTS) IMPLANT
ELECT DEFIB PAD ADLT CADENCE (PAD) IMPLANT
GLIDESHEATH SLEND SS 6F .021 (SHEATH) IMPLANT
GUIDEWIRE INQWIRE 1.5J.035X260 (WIRE) IMPLANT
INQWIRE 1.5J .035X260CM (WIRE) ×1
PACK CARDIAC CATHETERIZATION (CUSTOM PROCEDURE TRAY) ×2 IMPLANT
SET ATX-X65L (MISCELLANEOUS) IMPLANT
SHEATH PROBE COVER 6X72 (BAG) IMPLANT
WIRE HI TORQ VERSACORE-J 145CM (WIRE) IMPLANT

## 2022-12-17 NOTE — Interval H&P Note (Signed)
History and Physical Interval Note:  12/17/2022 9:59 AM  Kaylee Pope  has presented today for surgery, with the diagnosis of unstable angina and abnormal stress test.  The various methods of treatment have been discussed with the patient and family. After consideration of risks, benefits and other options for treatment, the patient has consented to  Procedure(s): LEFT HEART CATH AND CORS/GRAFTS ANGIOGRAPHY (N/A) as a surgical intervention.  The patient's history has been reviewed, patient examined, no change in status, stable for surgery.  I have reviewed the patient's chart and labs.  Questions were answered to the patient's satisfaction.    Cath Lab Visit (complete for each Cath Lab visit)  Clinical Evaluation Leading to the Procedure:   ACS: No.  Non-ACS:    Anginal Classification: CCS IV  Anti-ischemic medical therapy: Maximal Therapy (2 or more classes of medications)  Non-Invasive Test Results: Intermediate-risk stress test findings: cardiac mortality 1-3%/year  Prior CABG: Previous CABG  Kaylee Pope

## 2022-12-17 NOTE — Progress Notes (Signed)
TR BAND REMOVAL  LOCATION:    right radial  DEFLATED PER PROTOCOL:    Yes.    TIME BAND OFF / DRESSING APPLIED:    1305 gauze dressing applied   SITE UPON ARRIVAL:    Level 0  SITE AFTER BAND REMOVAL:    Level 0  CIRCULATION SENSATION AND MOVEMENT:    Within Normal Limits   Yes.    COMMENTS:   no issues noted

## 2022-12-17 NOTE — Discharge Instructions (Signed)

## 2022-12-18 ENCOUNTER — Encounter (HOSPITAL_COMMUNITY): Payer: Self-pay | Admitting: Internal Medicine

## 2022-12-18 ENCOUNTER — Ambulatory Visit: Admit: 2022-12-18 | Payer: Medicare HMO | Admitting: Internal Medicine

## 2022-12-18 ENCOUNTER — Ambulatory Visit: Payer: Medicare HMO | Admitting: Cardiology

## 2022-12-18 DIAGNOSIS — R079 Chest pain, unspecified: Secondary | ICD-10-CM

## 2022-12-18 SURGERY — LEFT HEART CATH AND CORS/GRAFTS ANGIOGRAPHY
Anesthesia: Moderate Sedation | Laterality: Left

## 2022-12-20 ENCOUNTER — Encounter: Payer: Self-pay | Admitting: Internal Medicine

## 2022-12-26 ENCOUNTER — Ambulatory Visit: Payer: Medicare HMO | Admitting: Internal Medicine

## 2022-12-26 NOTE — Progress Notes (Deleted)
Cardiology Office Note:  .   Date:  12/26/2022  ID:  Kaylee Pope, DOB 1955-11-10, MRN [id number] PCP: Jerl Mina, MD  [facility] Providers Cardiologist:  Yvonne Kendall, MD   History of Present Illness: .   Kaylee Pope is a 67 y.o. female with history of coronary artery disease status post four-vessel CABG (07/27/2022 LIMA-LAD, SVG-diagonal, SVG-OM, and SVG-PDA), thoracoabdominal aortic aneurysm status post repair [facility]), hypertension, hyperlipidemia, hypothyroidism who presents for follow-up of coronary artery disease and chest pain.  She was seen in our office in late August for evaluation of recurrent chest pain following her endovascular thoracicoabdominal aortic aneurysm repair earlier in the month.  Vascular surgery at [facility] recommended further cardiac workup, as they felt that her symptoms were not related to her recent vascular procedure.  Subsequent MPI was abnormal with a small reversible basal and mid inferior defect.  She subsequently underwent echo that showed low normal LVEF and cardiac catheterization that demonstrated severe native CAD but patent bypass grafts.  SVG-acute marginal had a 60 to 70% stenosis, though this was not intervened upon given that it did not appear severe and was supplying a small, diffusely diseased branch.  Isosorbide mononitrate was added to see if that would help improve some of her symptoms in anticipation of upcoming vascular procedure to complete TAAA repair and treat endoleak noted on CTA last month.  ROS: See HPI  Studies Reviewed: Marland Kitchen        LHC (12/17/2022): Severe native three-vessel coronary artery disease, as detailed below, including total occlusions of the mid LAD, mid LCx, and mid RCA.  Distal RCA branches fill via left-to-right collaterals. Widely patent LIMA-LAD. Patent SVG-D with minimal luminal irregularities. Patent SVG-OM3 with graft ectasia and minimal luminal irregularities. Patent SVG-AM with long 60-70% stenosis in  the proximal graft and supplying a small and diffusely disease branch. Mildly elevated left ventricular filling pressure (LVEDP 20 mmHg).  Limited echo (12/14/2022):  1. Left ventricular ejection fraction, by estimation, is 50 to 55%. The  left ventricle has low normal function. The average left ventricular  global longitudinal strain is -11.7 %. The global longitudinal strain is  abnormal.   2. Right ventricular systolic function is normal. The right ventricular  size is normal.   3. The mitral valve is normal in structure. Mild mitral valve  regurgitation.   4. The aortic valve is tricuspid. Aortic valve regurgitation is mild.  Aortic valve sclerosis is present, with no evidence of aortic valve  stenosis.   5. The inferior vena cava is normal in size with greater than 50%  respiratory variability, suggesting right atrial pressure of 3 mmHg.   Risk Assessment/Calculations:   {Does this patient have ATRIAL FIBRILLATION?[phone]} No BP recorded.  {Refresh Note OR Click here to enter BP  :1}***       Physical Exam:   VS:  There were no vitals taken for this visit.   Wt Readings from Last 3 Encounters:  12/17/22 152 lb (68.9 kg)  11/29/22 157 lb 6.4 oz (71.4 kg)  11/06/22 158 lb 6.4 oz (71.8 kg)    General:  NAD. Neck: No JVD or HJR. Lungs: Clear to auscultation bilaterally without wheezes or crackles. Heart: Regular rate and rhythm without murmurs, rubs, or gallops. Abdomen: Soft, nontender, nondistended. Extremities: No lower extremity edema.  ASSESSMENT AND PLAN: .    ***    {Are you ordering a CV Procedure (e.g. stress test, cath, DCCV, TEE, etc)?   Press F2        :  [id number]}  Dispo: ***  Signed, Yvonne Kendall, MD

## 2022-12-28 ENCOUNTER — Ambulatory Visit: Payer: Medicare HMO | Admitting: Cardiology

## 2023-01-17 ENCOUNTER — Ambulatory Visit: Payer: Medicare HMO | Admitting: Cardiology

## 2023-01-20 ENCOUNTER — Encounter: Payer: Self-pay | Admitting: Internal Medicine

## 2023-01-21 ENCOUNTER — Ambulatory Visit: Payer: Medicare HMO | Attending: Cardiology | Admitting: Internal Medicine

## 2023-01-21 ENCOUNTER — Encounter: Payer: Self-pay | Admitting: Internal Medicine

## 2023-01-21 VITALS — BP 172/102 | HR 94 | Ht 60.0 in | Wt 146.0 lb

## 2023-01-21 DIAGNOSIS — I25118 Atherosclerotic heart disease of native coronary artery with other forms of angina pectoris: Secondary | ICD-10-CM | POA: Diagnosis not present

## 2023-01-21 DIAGNOSIS — I10 Essential (primary) hypertension: Secondary | ICD-10-CM

## 2023-01-21 DIAGNOSIS — Z951 Presence of aortocoronary bypass graft: Secondary | ICD-10-CM | POA: Diagnosis not present

## 2023-01-21 DIAGNOSIS — I716 Thoracoabdominal aortic aneurysm, without rupture, unspecified: Secondary | ICD-10-CM

## 2023-01-21 DIAGNOSIS — E785 Hyperlipidemia, unspecified: Secondary | ICD-10-CM | POA: Diagnosis not present

## 2023-01-21 NOTE — Progress Notes (Unsigned)
Cardiology Office Note:  .   Date:  01/21/2023  ID:  Kaylee Pope, DOB 05-22-1955, MRN [id number] PCP: Jerl Mina, MD  [facility] Providers Cardiologist:  Yvonne Kendall, MD   History of Present Illness: .   Kaylee Pope is a 67 y.o. female with history of coronary artery disease status post four-vessel CABG (07/27/2022 LIMA-LAD, SVG-diagonal, SVG-OM, and SVG-PDA), thoracoabdominal aortic aneurysm status post repair [facility]), hypertension, hyperlipidemia, hypothyroidism who presents for follow-up of coronary artery disease and chest pain.  She was seen in our office in late August for evaluation of recurrent chest pain following her endovascular thoracicoabdominal aortic aneurysm repair earlier in the month.  Vascular surgery at [facility] recommended further cardiac workup, as they felt that her symptoms were not related to her recent vascular procedure.  Subsequent MPI was abnormal with a small reversible basal and mid inferior defect.  She subsequently underwent echo that showed low normal LVEF and cardiac catheterization that demonstrated severe native CAD but patent bypass grafts.  SVG-acute marginal had a 60 to 70% stenosis, though this was not intervened upon given that it did not appear severe and was supplying a small, diffusely diseased branch.  Isosorbide mononitrate was added to see if that would help improve some of her symptoms in anticipation of upcoming vascular procedure to complete TAAA repair and treat endoleak noted on CTA last month.  She reached out to [place] with concerns about headaches since starting isosorbide mononitrate.  She underwent endovascular thoracicoabdominal aortic aneurysm repair at [facility] on 01/01/2023.  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  She has continued to have some  weeping of fluid from the left groin incision.  She was seen in the vascular surgery clinic last week and placed on Augmentin with some improvement in the amount of discharge.  She also complains of pain in the pelvis, typically radiating from the left lower pelvis around to her back.  Previous upper back/chest pain has resolved.  She gets tired easily but denies significant dyspnea.  She notes that her heart rate jumps up when she does any activity.  Carvedilol was decreased after her vascular surgery procedure to allow for some permissive hypertension to minimize the risk for spinal cord hypoperfusion and injury.  She is currently on aspirin 81 mg daily; she was initially to go home on clopidogrel as well though [name] reports this was ultimately withheld due to mild thrombocytopenia.  She is scheduled for follow-up with Dr. Pattricia Boss this Thursday.  Kaylee Pope is no longer taking isosorbide mononitrate, as she did not notice any improvement with her chest/upper back pain but had significant headaches while on the medication.  ROS: See HPI  Studies Reviewed: Marland Kitchen   EKG Interpretation Date/Time:  Monday January 21 2023 16:41:05 EDT Ventricular Rate:  92 PR Interval:  162 QRS Duration:  86 QT Interval:  384 QTC Calculation: 474 R Axis:   80  Text Interpretation: Normal sinus rhythm Possible Left atrial enlargement Cannot rule out Anterior infarct versus lead placement When compared with ECG of 29-Nov-2022 14:22, Heart rate has increased Nonspecific T wave abnormality is no longer Present Confirmed by Yvonne Kendall [phone]) on 01/21/2023 4:46:03 PM    LHC (12/17/2022): Severe native three-vessel coronary artery disease, as detailed below, including total occlusions of the mid LAD, mid  LCx, and mid RCA.  Distal RCA branches fill via left-to-right collaterals. Widely patent LIMA-LAD. Patent SVG-D with minimal luminal irregularities. Patent SVG-OM3 with graft ectasia and minimal luminal  irregularities. Patent SVG-AM with long 60-70% stenosis in the proximal graft and supplying a small and diffusely disease branch. Mildly elevated left ventricular filling pressure (LVEDP 20 mmHg).  Limited echo (12/14/2022):  1. Left ventricular ejection fraction, by estimation, is 50 to 55%. The  left ventricle has low normal function. The average left ventricular  global longitudinal strain is -11.7 %. The global longitudinal strain is  abnormal.   2. Right ventricular systolic function is normal. The right ventricular  size is normal.   3. The mitral valve is normal in structure. Mild mitral valve  regurgitation.   4. The aortic valve is tricuspid. Aortic valve regurgitation is mild.  Aortic valve sclerosis is present, with no evidence of aortic valve  stenosis.   5. The inferior vena cava is normal in size with greater than 50%  respiratory variability, suggesting right atrial pressure of 3 mmHg.   Risk Assessment/Calculations:     HYPERTENSION CONTROL Vitals:   01/21/23 1635 01/21/23 1642  BP: (!) 164/98 (!) 172/102    The patient's blood pressure is elevated above target today.  In order to address the patient's elevated BP: Blood pressure will be monitored at home to determine if medication changes need to be made.         Physical Exam:   VS:  BP (!) 172/102 (BP Location: Left Arm, Patient Position: Sitting, Cuff Size: Normal)   Pulse 94   Ht 5' (1.524 m)   Wt 146 lb (66.2 kg)   SpO2 94%   BMI 28.51 kg/m    Wt Readings from Last 3 Encounters:  01/21/23 146 lb (66.2 kg)  12/17/22 152 lb (68.9 kg)  11/29/22 157 lb 6.4 oz (71.4 kg)    General:  NAD.  Accompanied by her husband. Neck: No JVD or HJR. Lungs: Clear to auscultation bilaterally without wheezes or crackles. Heart: Regular rate and rhythm without murmurs, rubs, or gallops. Abdomen: Soft, nontender, nondistended. Extremities: No lower extremity edema.  Left groin incision with staples in place.  The  inferior portion of the incision is not completely approximated and has small amount of serous fluid drainage.  No significant erythema noted surrounding the incision.  Small amount of purulent material noted at the superior aspect of the incision, which cannot be expressed..  ASSESSMENT AND PLAN: .    Coronary artery disease with stable angina and hyperlipidemia: Previous upper back pain concerning for anginal equivalent has resolved with completion of thoracicoabdominal aortic aneurysm repair.  Catheterization last month was reassuring with patent bypass grafts.  Given the lack of improvement with isosorbide mononitrate and associated headaches, we will not resume this medication.  Continue carvedilol and amlodipine for blood pressure and antianginal control.  Would favor escalation of carvedilol back to 12.5 mg twice daily as soon as vascular surgery feels it is appropriate to do so.  Continue secondary prevention with aspirin and rosuvastatin.  Thoracicoabdominal aortic aneurysm: Patient is now status post staged repair that was complicated by left iliac artery rupture and thrombosis of the left CFA/SFA.  She is scheduled for follow-up with vascular surgery at [facility] later this week.  She still has some drainage of the left groin wound.  I recommend that she continue Augmentin and follow-up with vascular surgery as scheduled.  Hypertension: Blood pressure poorly controlled today, though Ms.  Asquith notes better numbers at home (typically in the 140s over 80s).  She informs me that vascular surgery at [facility] reduced her carvedilol dosing to allow for permissive hypertension to minimize risk for spinal cord injury following her aneurysm repair.  I will defer medication changes today but would advocate for escalation of antihypertensive therapy as soon as vascular surgery feels it is safe to do so.  Ideally, I would target a long-term blood pressure below 130/80.    Dispo: Return to clinic as previously  scheduled on 03/12/2023 with Eula Listen, PA.  Signed, Yvonne Kendall, MD

## 2023-01-21 NOTE — Patient Instructions (Addendum)
Medication Instructions:  Your physician recommends that you continue on your current medications as directed. Please refer to the Current Medication list given to you today.   *If you need a refill on your cardiac medications before your next appointment, please call your pharmacy*   Lab Work: No labs ordered today    Testing/Procedures: No test ordered today    Follow-Up: At [facility], you and your health needs are our priority.  As part of our continuing mission to provide you with exceptional heart care, we have created designated Provider Care Teams.  These Care Teams include your primary Cardiologist (physician) and Advanced Practice Providers (APPs -  Physician Assistants and Nurse Practitioners) who all work together to provide you with the care you need, when you need it.  We recommend signing up for the patient portal called "MyChart".  Sign up information is provided on this After Visit Summary.  MyChart is used to connect with patients for Virtual Visits (Telemedicine).  Patients are able to view lab/test results, encounter notes, upcoming appointments, etc.  Non-urgent messages can be sent to your provider as well.   To learn more about what you can do with MyChart, go to [email or web address].    Your next appointment:   December 3 @ 10:30 am   Provider:   Eula Listen, PA-C

## 2023-01-22 ENCOUNTER — Encounter: Payer: Self-pay | Admitting: Internal Medicine

## 2023-01-26 ENCOUNTER — Encounter: Payer: Self-pay | Admitting: Internal Medicine

## 2023-02-13 IMAGING — CT CT ANGIO CHEST-ABD-PELV FOR DISSECTION W/ AND WO/W CM
2 of 7 series · 12 of 46 positions shown, 14 images · IV contrast (APPLIED)
Comparison: CT scan of the chest 08/24/2020

CLINICAL DATA: Thoracic aneurysm follow-up

EXAM:
CT ANGIOGRAPHY CHEST, ABDOMEN AND PELVIS
TECHNIQUE: Non-contrast CT of the chest was initially obtained.

[Series 5: axial arterial · axial · arterial · 0.76mm/px · z∈[-474,+74]mm · 9 of 211 slices shown, 11 images]
[im 14/211  soft-tissue]
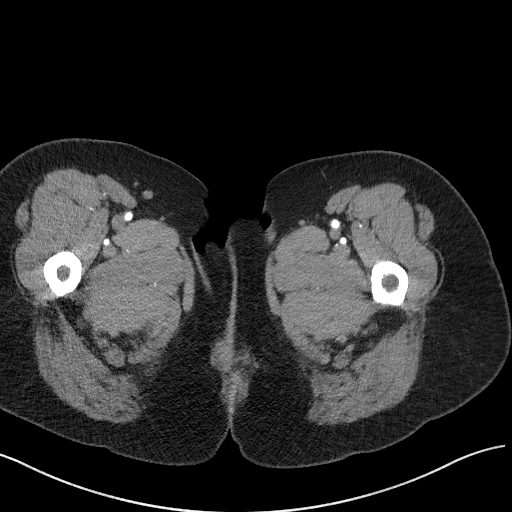
[im 14/211  bone]
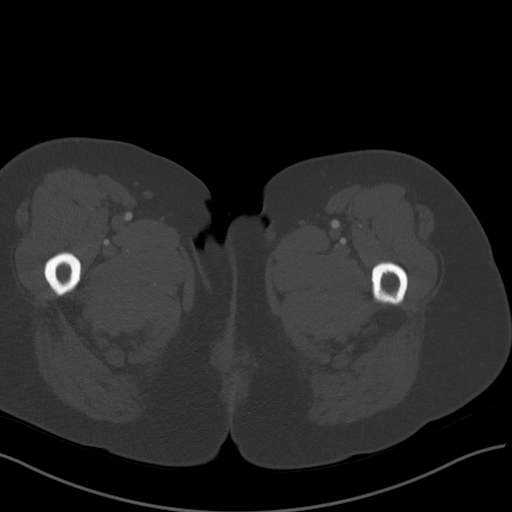
[im 40/211  soft-tissue]
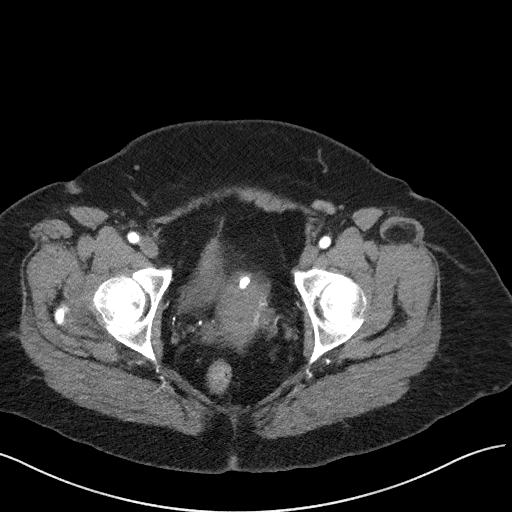
[im 66/211  soft-tissue]
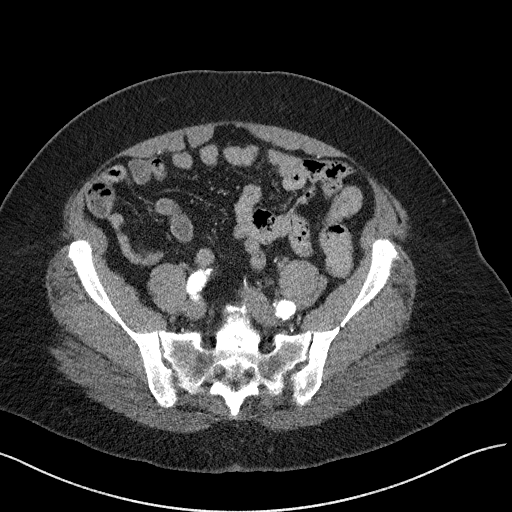
[im 79/211  soft-tissue]
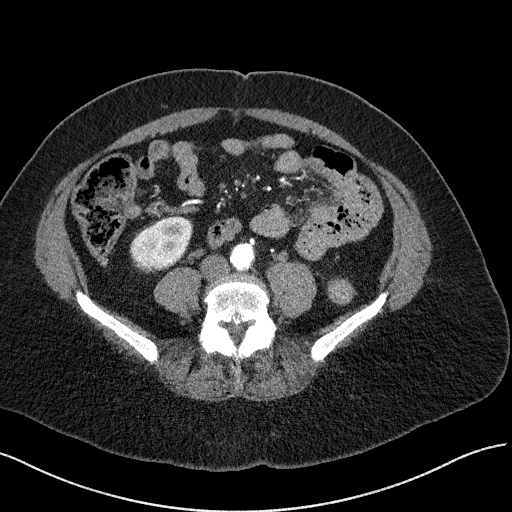
[im 106/211  soft-tissue]
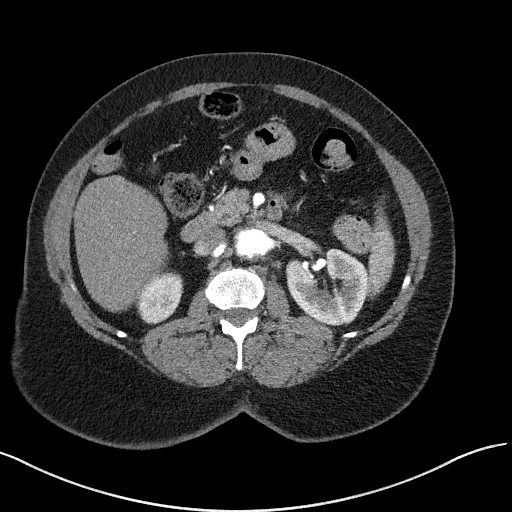
[im 132/211  soft-tissue]
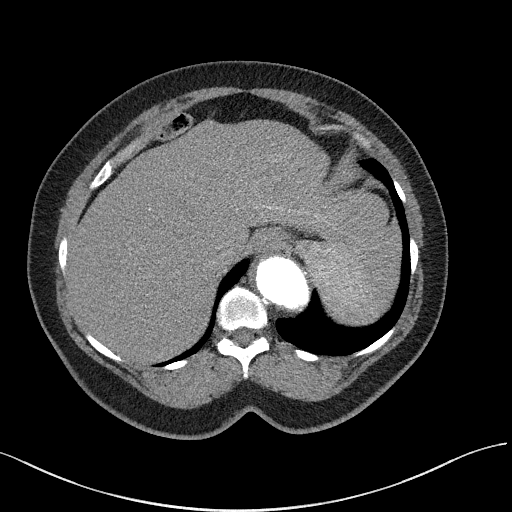
[im 145/211  soft-tissue]
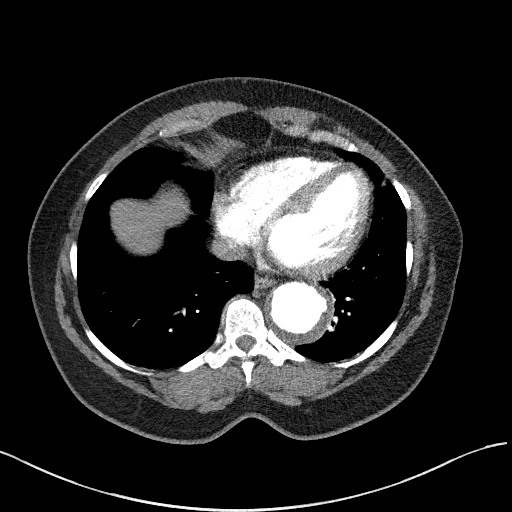
[im 171/211  soft-tissue]
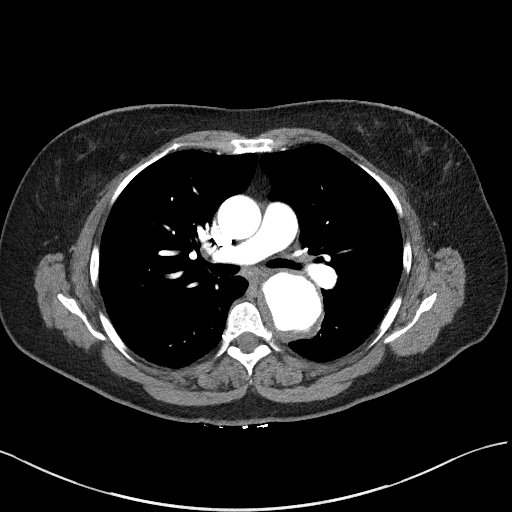
[im 197/211  soft-tissue]
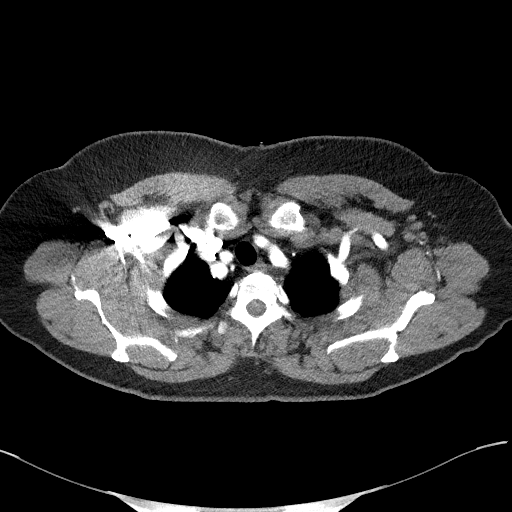
[im 197/211  bone]
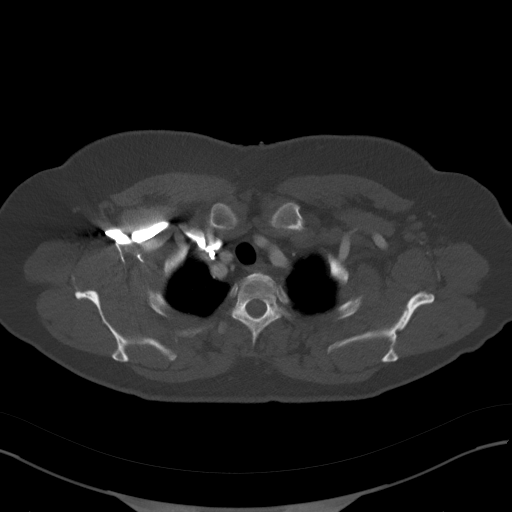

[Series 7: coronals · coronal · 0.73mm/px · 3 of 163 slices shown]
[im 41/163  soft-tissue]
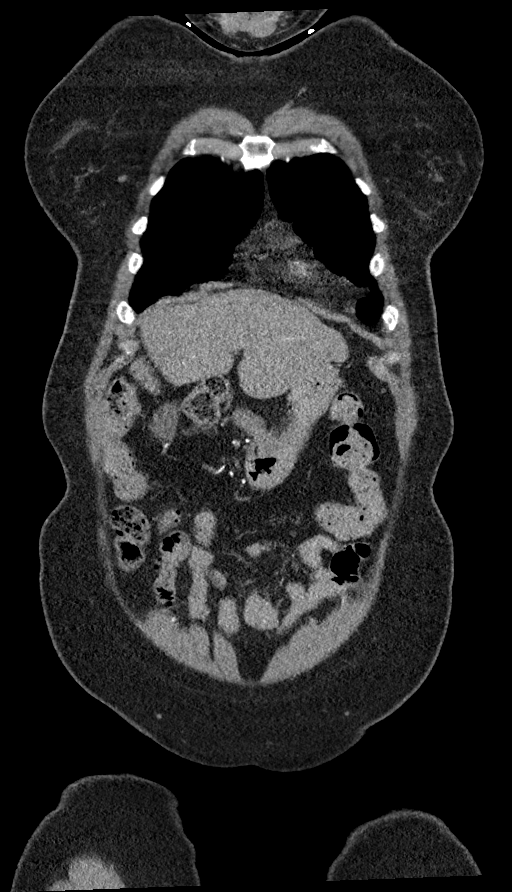
[im 82/163  soft-tissue]
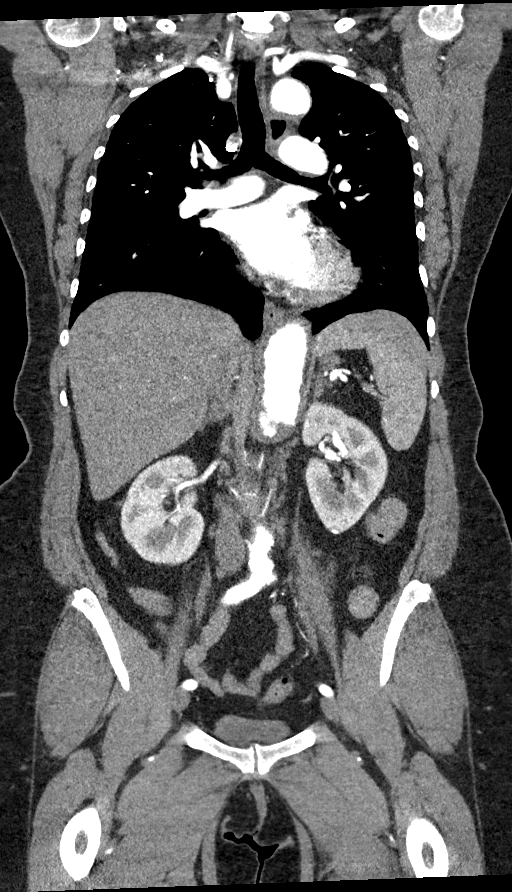
[im 122/163  soft-tissue]
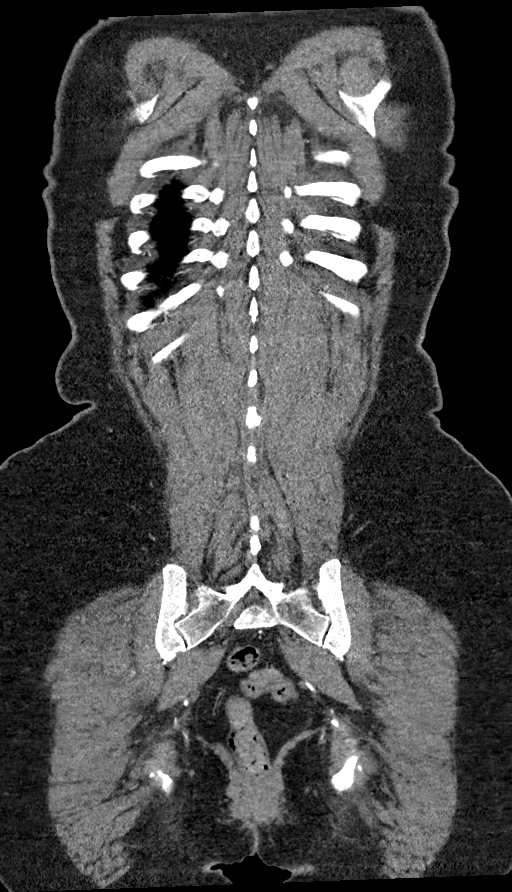

[12 of 46 positions shown; findings below may reference images not displayed]

Multidetector CT imaging through the chest, abdomen and pelvis was
performed using the standard protocol during bolus administration of
intravenous contrast. Multiplanar reconstructed images and MIPs were
obtained and reviewed to evaluate the vascular anatomy.

CONTRAST:  80mL OMNIPAQUE IOHEXOL 350 MG/ML SOLN
FINDINGS: CTA CHEST FINDINGS

Cardiovascular: Initial noncontrast CT imaging of the chest
demonstrates no evidence of acute intramural hematoma. Following
injection of intravenous contrast, there is excellent opacification
of the thoracic aorta. The aortic root is normal in caliber as is
the ascending thoracic aorta. The transverse aorta is also within
normal limits. Conventional 3 vessel arch anatomy. Elongation of the
aortic isthmus and proximal descending thoracic aorta results in a
type 3 arch. Marked aneurysmal dilation present in the descending
thoracic aorta. Maximal aortic diameter is 5.2 cm (sagittal
reconstructed image 113 of series 8). Heterogeneous and irregular
atherosclerotic plaque with a small focus of penetrating
atherosclerotic ulceration (image 40 series 5).

The main pulmonary artery is normal in size. No evidence of
pulmonary embolus. Atherosclerotic calcifications visualized
throughout the left anterior descending, circumflex and right
coronary arteries. The heart is normal in size. No pericardial
effusion.

Mediastinum/Nodes: Unremarkable CT appearance of the thyroid gland.
No suspicious mediastinal or hilar adenopathy. No soft tissue
mediastinal mass. The thoracic esophagus is unremarkable.

Lungs/Pleura: Moderately severe centrilobular pulmonary emphysema.
No suspicious pulmonary mass or nodules. No pleural effusion or
pneumothorax.

Musculoskeletal: No acute fracture or aggressive appearing lytic or
blastic osseous lesion.

Review of the MIP images confirms the above findings.

CTA ABDOMEN AND PELVIS FINDINGS

VASCULAR

Aorta: Heterogeneous and irregular atherosclerotic plaque throughout
the abdominal aorta. Aneurysmal dilation of the visceral segment
with a maximal diameter of 4.1 cm. The aorta then resumes a normal
caliber in till the infrarenal segment were there is mild focal
aneurysmal dilatation with a maximal diameter of 3.2 cm.

Celiac: Small calcified splenic artery aneurysm in the splenic hilum
measures 6 mm. Otherwise, the celiac artery and its branches are
unremarkable.

SMA: Patent without evidence of aneurysm, dissection, vasculitis or
significant stenosis.

Renals: Mild stenosis at the origin of the solitary right renal
artery. Solitary left renal artery without significant stenosis. No
aneurysm, dissection or fibromuscular dysplasia.

IMA: Patent without evidence of aneurysm, dissection, vasculitis or
significant stenosis.

Inflow: Patent without evidence of aneurysm, dissection, vasculitis
or significant stenosis.

Veins: No obvious venous abnormality within the limitations of this
arterial phase study.

Review of the MIP images confirms the above findings.

NON-VASCULAR

Hepatobiliary: No focal liver abnormality is seen. No gallstones,
gallbladder wall thickening, or biliary dilatation.

Pancreas: Unremarkable. No pancreatic ductal dilatation or
surrounding inflammatory changes.

Spleen: Normal in size without focal abnormality.

Adrenals/Urinary Tract: Adrenal glands are unremarkable. Kidneys are
normal, without renal calculi, focal lesion, or hydronephrosis.
Bladder is unremarkable.

Stomach/Bowel: Stomach is within normal limits. Appendix appears
normal. No evidence of bowel wall thickening, distention, or
inflammatory changes.

Lymphatic: No suspicious lymphadenopathy.

Reproductive: Dystrophic uterine fibroids.  No adnexal masses.

Other: No abdominal wall hernia or abnormality. No abdominopelvic
ascites.

Musculoskeletal: No acute or significant osseous findings.

Review of the MIP images confirms the above findings.
IMPRESSION: 1. Thoracoabdominal aortic aneurysm involving the descending
thoracic aorta, the visceral abdominal aorta and infrarenal aorta.
Maximal diameter of the descending thoracic aortic aneurysm is
cm, the visceral abdominal aortic aneurysm 4.1 cm, in the infrarenal
abdominal aortic aneurysm 3.2 cm. Heterogeneous and
irregular/ulcerated fibrofatty plaque throughout the diseased aorta.
Recommend follow-up every 12 months and vascular consultation. This
recommendation follows ACR consensus guidelines: White Paper of the
ACR Incidental Findings Committee II on Vascular Findings. [HOSPITAL] 1895; [DATE].
2. Multivessel coronary artery atherosclerotic calcifications.
3. Moderately severe centrilobular pulmonary emphysema.
4. Small 6 mm calcified splenic artery aneurysm at the splenic
hilum.
5. Additional ancillary findings as above.

Aortic aneurysm NOS (QOOQL-3N4.E), aortic Atherosclerosis
(QOOQL-K5Q.Q) and Emphysema (QOOQL-FFK.K).

## 2023-03-10 NOTE — Progress Notes (Unsigned)
Cardiology Office Note    Date:  03/12/2023   ID:  Kaylee Pope, Kaylee Pope [birth year], MRN [id number]  PCP:  Jerl Mina, MD  Cardiologist:  Yvonne Kendall, MD  Electrophysiologist:  None   Chief Complaint: Follow-up  History of Present Illness:   Kaylee Pope is a 67 y.o. 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  She has a history of enlarging thoracoabdominal aortic aneurysm measuring 5.7 cm and was scheduled for endovascular repair at [facility]. In this setting, she underwent preoperative cardiac risk stratification due to exertional substernal and left-sided chest discomfort. Echo performed at [facility] on 07/10/2022 showed an EF greater than 55%, normal RV systolic function and ventricular cavity size, and mild aortic insufficiency. Carotid artery ultrasound at that time showed less than 50% bilateral ICA stenoses. She underwent coronary CTA on 07/12/2022 which demonstrated a calcium score of 1361 which was the 99th percentile. There was significant dilatation of the descending thoracic aorta measuring 56 mm in diameter with severe mural atheroma. The RCA was dominant with a CTO of the proximal to mid segment, 50 to 69% proximal LAD stenosis, and 25 to 49% proximal nondominant LCx stenosis. CT FFR was significant in the mid LAD and LCx. LHC on 07/23/2022 showed severe three-vessel CAD including sequential 70 to 90% proximal and mid LAD stenoses with aneurysmal segment at the takeoff of a large D1 branch, 70 to 80% proximal/mid LCx stenosis and CTO of the mid RCA with left to right collaterals. Mildly elevated LVEDP estimated at 22 mmHg. She was subsequently evaluated by CVTS and underwent  four-vessel CABG on 07/27/2022. Post cath course was largely uncomplicated with expected postoperative volume overload and blood loss anemia. She was diuresed. She did have hypoxia with oxygen saturations in the mid 80s with ambulation and was discharged on supplemental oxygen.  She was seen in our office in 11/2022 for evaluation of recurrent chest pain following endovascular thoracoabdominal aortic aneurysm repair earlier in the month.  Vascular surgery felt her symptoms were not related to her recent vascular repair with known endoleak.  Subsequent MPI was abnormal with a small reversible basal and mid inferior defect.  She subsequently underwent echo that showed low normal LVEF.  Cardiac cath demonstrated severe native vessel CAD with patent bypass grafts.  SVG to acute marginal at 60 to 70% stenosis, though this was not intervened upon given it did not appear severe and was supplying a small, diffusely diseased branch.  Imdur was added, though this led to headaches.  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  LCIA and LEIA were stented along with left CFA/SFA endarterectomy with patch angioplasty, profundaplasty, and SFA embolectomy.  She was last seen in the office on 01/21/2023 and noted ongoing pain in the pelvis that typically radiated from the left lower pelvis around to her back.  Previous upper back and chest pain were resolved.  She  noted fatigue as well.  Post procedure, [facility] decreased her carvedilol to allow for some permissive hypertension and to minimize the risk for spinal cord hypoperfusion and injury.  She was on monotherapy with aspirin 81 mg (clopidogrel was deferred  with mild thrombocytopenia).  BP at our office visit was elevated at 172/102, though she reported better readings at home typically in the 140s over 80s.  It was recommended to carvedilol be escalated for management of hypertension as soon as vascular surgery felt it was not safe to do so.  She subsequently notified our office of episodes of epistaxis on clopidogrel with recommendation to discuss this with [facility] vascular surgery who subsequently discontinued clopidogrel.  Followed up with [facility] vascular surgery on 02/01/2023 with improved blood pressure of 138/79.  Her home blood pressure readings over the past two months ranged from 141/82 to 143/83.  She comes in accompanied by her husband and is without symptoms of angina or cardiac decompensation.  No palpitations, dizziness, presyncope, or syncope.  She has increased carvedilol back to 12.5 mg twice daily following [facility] appointment.  She continues to note left flank and left pelvic/groin pain following her vascular procedure.  She also notes an intermittent episode of chest discomfort.  She is interested in pursuing cardiac rehab.   Labs independently reviewed: 01/2023 - BUN 12, serum creatinine 0.72, potassium 3.8 Hgb 10.6, PLT 144, magnesium 2.0 11/2022 - albumin 3.9, AST/ALT not elevated 09/2022 - TC 145, TG 108, HDL 53, LDL 70, TSH normal 07/2022 - A1c 5.9, LP(a) 307.5  Past Medical History:  Diagnosis Date   Allergy    CAD (coronary artery disease)    a. s/p 4-V CABG (LIMA-LAD, VG-diag, VG-OM, VG-PDA) 07/2022   Hyperlipidemia    Hypertension    Thoracoabdominal aortic aneurysm (HCC)    Thyroid disease     Past Surgical History:  Procedure Laterality Date   CORONARY ARTERY BYPASS GRAFT N/A 07/27/2022   Procedure: CORONARY ARTERY BYPASS GRAFTING (CABG) X4 USING LEFT INTERNAL MAMMARY ARTERY AND BILATERAL ENDOSCOPIC HARVESTED GREATER SAPHENOUS VEINS;  Surgeon: Alleen Borne, MD;  Location: [facility] OR;  Service: Open Heart Surgery;  Laterality:  N/A;   LEFT HEART CATH AND CORONARY ANGIOGRAPHY N/A 07/23/2022   Procedure: LEFT HEART CATH AND CORONARY ANGIOGRAPHY;  Surgeon: Yvonne Kendall, MD;  Location: [facility] INVASIVE CV LAB;  Service: Cardiovascular;  Laterality: N/A;   LEFT HEART CATH AND CORS/GRAFTS ANGIOGRAPHY N/A 12/17/2022   Procedure: LEFT HEART CATH AND CORS/GRAFTS ANGIOGRAPHY;  Surgeon: Yvonne Kendall, MD;  Location: [facility] INVASIVE CV LAB;  Service: Cardiovascular;  Laterality: N/A;   TEE WITHOUT CARDIOVERSION N/A 07/27/2022   Procedure: TRANSESOPHAGEAL ECHOCARDIOGRAM;  Surgeon: Alleen Borne, MD;  Location: [facility] OR;  Service: Open Heart Surgery;  Laterality: N/A;   TONSILLECTOMY     age 88    Current Medications: Current Meds  Medication Sig   acetaminophen (TYLENOL) 500 MG tablet Take 1,000 mg by mouth every 6 (six) hours as needed for moderate pain.   aspirin EC 81 MG tablet Take 81 mg by mouth daily. Swallow whole.   carvedilol (COREG) 12.5 MG tablet Take 1 tablet (12.5 mg total) by mouth 2 (two) times daily.   furosemide (LASIX) 20 MG tablet Take 1 tablet (20 mg total) by mouth daily.   levothyroxine (SYNTHROID, LEVOTHROID) 75 MCG tablet Take 1 tablet (75 mcg total) by mouth daily.   rosuvastatin (CRESTOR) 20 MG tablet Take 1 tablet (20 mg total) by mouth daily.   venlafaxine XR (  EFFEXOR-XR) 150 MG 24 hr capsule Take 1 capsule (150 mg total) by mouth daily.   [DISCONTINUED] amLODipine (NORVASC) 2.5 MG tablet Take by mouth.    Allergies:   Patient has no known allergies.   Social History   Socioeconomic History   Marital status: Married    Spouse name: Not on file   Number of children: 2   Years of education: Not on file   Highest education level: Not on file  Occupational History   Not on file  Tobacco Use   Smoking status: Former    Current packs/day: 0.25    Average packs/day: 0.3 packs/day for 50.0 years (12.5 ttl pk-yrs)    Types: Cigarettes    Passive exposure: Current   Smokeless tobacco: Never   Tobacco  comments:    Stopped smoking 4/46/2024  Vaping Use   Vaping status: Never Used  Substance and Sexual Activity   Alcohol use: Not Currently   Drug use: Never   Sexual activity: Not on file  Other Topics Concern   Not on file  Social History Narrative   Engineer, materials at [employer]   One grandson   Social Determinants of Health   Financial Resource Strain: Low Risk  (11/13/2022)   Received from [facility]   Overall Financial Resource Strain (CARDIA)    Difficulty of Paying Living Expenses: Not very hard  Food Insecurity: No Food Insecurity (11/13/2022)   Received from [facility]   Hunger Vital Sign    Worried About Running Out of Food in the Last Year: Never true    Ran Out of Food in the Last Year: Never true  Transportation Needs: No Transportation Needs (10/10/2022)   Received from [facility]   PRAPARE - Transportation    In the past 12 months, has lack of transportation kept you from medical appointments or from getting medications?: No    Lack of Transportation (Non-Medical): No  Physical Activity: Not on file  Stress: Not on file  Social Connections: Not on file     Family History:  The patient's family history includes Breast cancer (age of onset: 102) in her maternal aunt; COPD in her mother; Cancer in her maternal aunt; Diabetes in her sister; Heart Problems in her maternal grandmother; Heart attack in her father; Stroke in her father.  ROS:   12-point review of systems is negative unless otherwise noted in the HPI.   EKGs/Labs/Other Studies Reviewed:    Studies reviewed were summarized above. The additional studies were reviewed today:  LHC 12/17/2022: Conclusions: Severe native three-vessel coronary artery disease, as detailed below, including total occlusions of the mid LAD, mid LCx, and mid RCA.  Distal RCA branches fill via left-to-right collaterals. Widely patent LIMA-LAD. Patent SVG-D with  minimal luminal irregularities. Patent SVG-OM3 with graft ectasia and minimal luminal irregularities. Patent SVG-AM with long 60-70% stenosis in the proximal graft and supplying a small and diffusely disease branch. Mildly elevated left ventricular filling pressure (LVEDP 20 mmHg).   Recommendations: No culprit lesion identified for recent chest pain and dyspnea that began after TAAA repair with known endoleak.  Will add isosorbide mononitrate 15 mg daily and furosemide 20 mg daily for medical therapy, including possible component of diastolic heart failure.  SVG-AM is not ideal for PCI, given long segment of moderate to severe disease and small, diffusely disease distal vessel. Continue aggressive secondary prevention of ASCVD. Proceed with staged TAAA repair per [facility] vascular  surgery (I have contacted them regarding the findings of today's cath and my recommendations. ___________  Limited echo 12/14/2022: 1. Left ventricular ejection fraction, by estimation, is 50 to 55%. The  left ventricle has low normal function. The average left ventricular  global longitudinal strain is -11.7 %. The global longitudinal strain is  abnormal.   2. Right ventricular systolic function is normal. The right ventricular  size is normal.   3. The mitral valve is normal in structure. Mild mitral valve  regurgitation.   4. The aortic valve is tricuspid. Aortic valve regurgitation is mild.  Aortic valve sclerosis is present, with no evidence of aortic valve  stenosis.   5. The inferior vena cava is normal in size with greater than 50%  respiratory variability, suggesting right atrial pressure of 3 mmHg.  __________  [facility] MPI 12/07/2022:   Abnormal pharmacologic myocardial perfusion stress test.   There is a small in size, moderate in severity, partially reversible defect involving the basal and mid inferior segments consistent with scar and periinfarct ischemia.   Left ventricular systolic function is normal  (LVEF 60-65%)   Post CABG findings are present as well as thoracoabdominal aortic aneurysm with endovascular graft in the descending aorta.   Atelectasis versus scarring noted in the left upper lobe.   The study is intermediate risk. __________  2D echo 07/23/2022: 1. Left ventricular ejection fraction, by estimation, is 55 to 60%. The  left ventricle has normal function. The left ventricle has no regional  wall motion abnormalities. There is mild concentric left ventricular  hypertrophy. Left ventricular diastolic  parameters are indeterminate.   2. Right ventricular systolic function is normal. The right ventricular  size is normal.   3. The mitral valve is normal in structure. Trivial mitral valve  regurgitation. No evidence of mitral stenosis.   4. The aortic valve is normal in structure. Aortic valve regurgitation is  not visualized. No aortic stenosis is present.   5. Abodminal aorta is dilated ( 3.3cm).   6. The inferior vena cava is normal in size with greater than 50%  respiratory variability, suggesting right atrial pressure of 3 mmHg.   7. Evidence of atrial level shunting detected by color flow Doppler.  Small secundum ASD noted 1.20 cm.  __________   [facility] 07/23/2022: Conclusions: Severe three-vessel coronary artery disease including sequential 70-90% proximal and mid LAD stenoses with aneurysmal segment at the takeoff of large first diagonal branch, 70-80% proximal/mid LCx stenosis, and chronic total occlusion of mid RCA with left-to-right collaterals. Mildly elevated left ventricular filling pressure (LVEDP 22 mmHg).  LVEF known to be normal based on recent echocardiogram at [facility].   Recommendations: Admit for antianginal therapy, as progressive atypical left-sided chest pain is most likely the patient's anginal equivalent, as well as cardiac surgery consultation for CABG. Aggressive secondary prevention of coronary artery disease. __________   Coronary CTA with CT FFR  07/12/2022: IMPRESSION: 1. Descending thoracic aortic aneurysm this should be followed up with CTA of the chest for complete evaluation. 2. Otherwise no significant extracardiac incidental findings were seen.   FINDINGS: Aorta: Severe dilation of descending thoracic aorta, 56 mm in diameter, with severe mural atheroma. No dissection.   Aortic Valve:  Trileaflet. mild calcifications.   Coronary Arteries:  Normal coronary origin.  Right dominance.   RCA is a dominant artery. There is calcified and non calcified plaque proximally causing total occlusion in the proximal-mid segment (100%).   Left main gives rise to LAD and LCX  arteries. LM has no disease.   LAD has calcified plaque proximally causing moderate stenosis (50-69%).   LCX is a non-dominant artery. There is calcified plaque in the proximal segment causing mild stenosis (25-49%).   Other findings:   Normal pulmonary vein drainage into the left atrium.   Normal left atrial appendage without a thrombus.   Normal size of the pulmonary artery.   IMPRESSION: 1. Coronary calcium score of 1361. This was 99th percentile for age and sex matched control. 2. Normal coronary origin with right dominance. 3. CTO of proximal to mid RCA. 4. Moderate proximal LAD stenosis (50-69%). 5. Mild proximal LCx stenosis (25-49%). 6. Known severe dilation of descending thoracic aorta, 56 mm in diameter, with severe mural atheroma. 7. CAD-RADS 5 Total coronary occlusion (100%). Consider cardiac catheterization or viability assessment. Consider symptom-guided anti-ischemic pharmacotherapy as well as risk factor modification per guideline directed care. 8. Will submit CT FFR to analyze proximal LAD. Will be reported separately.   1. Left Main:  No significant stenosis.   2. LAD: significant stenosis in the mid LAD.  FFRct 0.71 3. LCX: significant stenosis in the mid LCx.  FFRct 0.77 4. RCA: Total occlusion of mid RCA.   IMPRESSION: 1.  CT FFR analysis showed significant stenosis in the mid LAD and LCx. 2.  Total occlusion of mid RCA. 3.  Cardiac catheterization recommended.   EKG:  EKG is ordered today.  The EKG ordered today demonstrates NSR, 69 bpm, nonspecific st/t changes  Recent Labs: 07/30/2022: Magnesium 2.3 11/23/2022: ALT 12 12/14/2022: BUN 12; Creatinine, Ser 0.85; Hemoglobin 10.0; Platelets 257; Potassium 3.8; Sodium 139  Recent Lipid Panel    Component Value Date/Time   CHOL 130 09/21/2022 1551   TRIG 111 09/21/2022 1551   HDL 52 09/21/2022 1551   CHOLHDL 2.5 09/21/2022 1551   VLDL 22 09/21/2022 1551   LDLCALC 56 09/21/2022 1551   LDLDIRECT 64 09/21/2022 1551    PHYSICAL EXAM:    VS:  BP (!) 164/88 (BP Location: Left Arm, Patient Position: Supine, Cuff Size: Normal)   Pulse 69   Ht 5' (1.524 m)   Wt 145 lb 9.6 oz (66 kg)   SpO2 96%   BMI 28.44 kg/m   BMI: Body mass index is 28.44 kg/m.  Physical Exam Vitals reviewed.  Constitutional:      Appearance: She is well-developed.  HENT:     Head: Normocephalic and atraumatic.  Eyes:     General:        Right eye: No discharge.        Left eye: No discharge.  Neck:     Vascular: No JVD.  Cardiovascular:     Rate and Rhythm: Normal rate and regular rhythm.     Pulses:          Dorsalis pedis pulses are 2+ on the right side and 1+ on the left side.       Posterior tibial pulses are 2+ on the right side and 1+ on the left side.     Heart sounds: Normal heart sounds, S1 normal and S2 normal. Heart sounds not distant. No midsystolic click and no opening snap. No murmur heard.    No friction rub.  Pulmonary:     Effort: Pulmonary effort is normal. No respiratory distress.     Breath sounds: Normal breath sounds. No decreased breath sounds, wheezing, rhonchi or rales.  Chest:     Chest wall: No tenderness.  Abdominal:  General: There is no distension.  Musculoskeletal:     Cervical back: Normal range of motion.     Right lower leg: No  edema.     Left lower leg: No edema.  Skin:    General: Skin is warm and dry.     Nails: There is no clubbing.  Neurological:     Mental Status: She is alert and oriented to person, place, and time.  Psychiatric:        Speech: Speech normal.        Behavior: Behavior normal.        Thought Content: Thought content normal.        Judgment: Judgment normal.     Wt Readings from Last 3 Encounters:  03/12/23 145 lb 9.6 oz (66 kg)  01/21/23 146 lb (66.2 kg)  12/17/22 152 lb (68.9 kg)     ASSESSMENT & PLAN:   CAD status post CABG with stable angina and HLD: She is doing well and without current symptoms of angina or cardiac decompensation.  Recent LHC showed patent grafts with SVG to AM with a long 60 to 70% stenosis in the proximal graft supplying a small and diffusely diseased branch, without culprit lesion identified for recurrent chest pain.  The SVG to AM was not ideal for PCI given long segment of moderate to severe disease and small, diffusely diseased distal vessel.  She did not tolerate Imdur secondary to headache.  Titrate amlodipine to 5 mg daily for added antianginal and blood pressure effect.  With recent CABG, and recent vascular intervention recommended she increase aspirin back to 325 mg daily given that she is unable to tolerate clopidogrel.  If she has recurrent epistaxis recommend ENT evaluation.  He will otherwise continue carvedilol and rosuvastatin.  Okay to participate with cardiac rehab.  Referral previously placed.  HTN: Blood pressure is elevated in the office and mildly elevated at home with most readings in the 140s over 80s.  Titrate amlodipine to 5 mg daily as outlined above with continuation of carvedilol 12.5 mg twice daily.  Thoracoabdominal aortic aneurysm status post endovascular repair complicated by left iliac artery rupture and thrombosis of the left CFA/SFA: She continues to note left-sided discomfort.  Postprocedure vascular ultrasound on 01/02/2023 with  normal ABI bilaterally.  She is considering transitioning her vascular surgery care.  Schedule ABIs and lower extremity arterial ultrasound.  Remains on aspirin outlined above.  Follow-up with vascular surgery as directed.    Disposition: F/u with Dr. Okey Dupre or an APP in 1 month to reassess blood pressure.   Medication Adjustments/Labs and Tests Ordered: Current medicines are reviewed at length with the patient today.  Concerns regarding medicines are outlined above. Medication changes, Labs and Tests ordered today are summarized above and listed in the Patient Instructions accessible in Encounters.   Signed, Eula Listen, PA-C 03/12/2023 1:03 PM     [facility] [place] [address] [place], [place] [address] [phone]

## 2023-03-12 ENCOUNTER — Ambulatory Visit: Payer: Medicare HMO | Attending: Physician Assistant | Admitting: Physician Assistant

## 2023-03-12 ENCOUNTER — Encounter: Payer: Self-pay | Admitting: Physician Assistant

## 2023-03-12 VITALS — BP 164/88 | HR 69 | Ht 60.0 in | Wt 145.6 lb

## 2023-03-12 DIAGNOSIS — Z951 Presence of aortocoronary bypass graft: Secondary | ICD-10-CM

## 2023-03-12 DIAGNOSIS — E785 Hyperlipidemia, unspecified: Secondary | ICD-10-CM

## 2023-03-12 DIAGNOSIS — Z95820 Peripheral vascular angioplasty status with implants and grafts: Secondary | ICD-10-CM | POA: Diagnosis not present

## 2023-03-12 DIAGNOSIS — I739 Peripheral vascular disease, unspecified: Secondary | ICD-10-CM

## 2023-03-12 DIAGNOSIS — I10 Essential (primary) hypertension: Secondary | ICD-10-CM

## 2023-03-12 DIAGNOSIS — I25118 Atherosclerotic heart disease of native coronary artery with other forms of angina pectoris: Secondary | ICD-10-CM | POA: Diagnosis not present

## 2023-03-12 DIAGNOSIS — I716 Thoracoabdominal aortic aneurysm, without rupture, unspecified: Secondary | ICD-10-CM

## 2023-03-12 MED ORDER — AMLODIPINE BESYLATE 5 MG PO TABS: 5.0000 mg | ORAL_TABLET | Freq: Every day | ORAL | 3 refills | Status: DC

## 2023-03-12 NOTE — Patient Instructions (Signed)
Medication Instructions:  Your physician recommends the following medication changes.  INCREASE: Amlodipine 5 mg daily   *If you need a refill on your cardiac medications before your next appointment, please call your pharmacy*   Lab Work: None ordered at this time    Testing/Procedures: Your physician has requested that you have an ankle brachial index (ABI). During this test an ultrasound and blood pressure cuff are used to evaluate the arteries that supply the arms and legs with blood.  Allow thirty minutes for this exam.  There are no restrictions or special instructions.  This will take place at [address] [address] [facility] [address], [place] [address]   Your physician has also requested that you have a lower extremity arterial duplex. During this test, ultrasound is used to evaluate arterial blood flow in the legs. Allow one hour for this exam. There are no restrictions or special instructions. This will take place at [address] [address], [place] [address]   Please note: We ask at that you not bring children with you during ultrasound (echo/ vascular) testing. Due to room size and safety concerns, children are not allowed in the ultrasound rooms during exams. Our front office staff cannot provide observation of children in our lobby area while testing is being conducted. An adult accompanying a patient to their appointment will only be allowed in the ultrasound room at the discretion of the ultrasound technician under special circumstances. We apologize for any inconvenience.    Follow-Up: At [facility], you and your health needs are our priority.  As part of our continuing mission to provide you with exceptional heart care, we have created designated Provider Care Teams.  These Care Teams include your primary Cardiologist (physician) and Advanced Practice Providers (APPs -  Physician Assistants and Nurse Practitioners) who all work  together to provide you with the care you need, when you need it.  We recommend signing up for the patient portal called "MyChart".  Sign up information is provided on this After Visit Summary.  MyChart is used to connect with patients for Virtual Visits (Telemedicine).  Patients are able to view lab/test results, encounter notes, upcoming appointments, etc.  Non-urgent messages can be sent to your provider as well.   To learn more about what you can do with MyChart, go to [email or web address].    Your next appointment:   1 month(s)  Provider:   You may see Yvonne Kendall, MD or one of the following Advanced Practice Providers on your designated Care Team:   Nicolasa Ducking, NP Eula Listen, PA-C Cadence Fransico Michael, PA-C Charlsie Quest, NP Carlos Levering, NP

## 2023-03-15 ENCOUNTER — Other Ambulatory Visit: Payer: Self-pay | Admitting: *Deleted

## 2023-03-15 ENCOUNTER — Other Ambulatory Visit: Payer: Self-pay | Admitting: Emergency Medicine

## 2023-03-15 MED ORDER — FUROSEMIDE 20 MG PO TABS: 20.0000 mg | ORAL_TABLET | Freq: Every day | ORAL | 0 refills | Status: DC

## 2023-03-19 ENCOUNTER — Other Ambulatory Visit: Payer: Self-pay | Admitting: *Deleted

## 2023-03-22 ENCOUNTER — Telehealth: Payer: Self-pay

## 2023-03-22 NOTE — Telephone Encounter (Signed)
Pt called to let us know she would like to f/u with Dr. Randie Heinz after she returns in January to see Dr. Pattricia Boss. He did her surgery in Sept. She has been advised to let Dr. Pattricia Boss know this, so that he can sign off on her and we will resume following her. No further questions/concerns at this time.

## 2023-04-05 ENCOUNTER — Ambulatory Visit (INDEPENDENT_AMBULATORY_CARE_PROVIDER_SITE_OTHER): Payer: Medicare HMO

## 2023-04-05 ENCOUNTER — Ambulatory Visit: Payer: Medicare HMO | Attending: Physician Assistant

## 2023-04-05 DIAGNOSIS — Z95820 Peripheral vascular angioplasty status with implants and grafts: Secondary | ICD-10-CM | POA: Diagnosis not present

## 2023-04-06 LAB — VAS US ABI WITH/WO TBI
Left ABI: 1.01
Right ABI: 0.98

## 2023-04-15 DIAGNOSIS — Z1231 Encounter for screening mammogram for malignant neoplasm of breast: Secondary | ICD-10-CM | POA: Diagnosis not present

## 2023-04-22 NOTE — Progress Notes (Signed)
 Cardiology Office Note    Date:  04/23/2023   ID:  Kaylee, Pope 07-24-1955, MRN [id number]  PCP:  Valora Agent, MD  Cardiologist:  Lonni Hanson, MD  Electrophysiologist:  None   Chief Complaint: Follow-up  History of Present Illness:   Kaylee Pope is a 68 y.o. 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   She has a history of enlarging thoracoabdominal aortic aneurysm measuring 5.7 cm and was scheduled for endovascular repair at [facility]. In this setting, she underwent preoperative cardiac risk stratification due to exertional substernal and left-sided chest discomfort. Echo performed at [facility] on 07/10/2022 showed an EF greater than 55%, normal RV systolic function and ventricular cavity size, and mild aortic insufficiency. Carotid artery ultrasound at that time showed less than 50% bilateral ICA stenoses. She underwent coronary CTA on 07/12/2022 which demonstrated a calcium  score of 1361 which was the 99th percentile. There was significant dilatation of the descending thoracic aorta measuring 56 mm in diameter with severe mural atheroma. The RCA was dominant with a CTO of the proximal to mid segment, 50 to 69% proximal LAD stenosis, and 25 to 49% proximal nondominant LCx stenosis. CT FFR was significant in the mid LAD and LCx. LHC on 07/23/2022 showed severe three-vessel CAD including sequential 70 to 90% proximal and mid LAD stenoses with aneurysmal segment at the takeoff of a large D1 branch, 70 to 80% proximal/mid LCx stenosis and CTO of the mid RCA with left to right collaterals. Mildly elevated LVEDP estimated at 22 mmHg. She was subsequently evaluated by CVTS and underwent  four-vessel CABG on 07/27/2022. Post cath course was largely uncomplicated with expected postoperative volume overload and blood loss anemia. She was diuresed. She did have hypoxia with oxygen saturations in the mid 80s with ambulation and was discharged on supplemental oxygen.  She was seen in our office in 11/2022 for evaluation of recurrent chest pain following endovascular thoracoabdominal aortic aneurysm repair earlier in the month.  Vascular surgery felt her symptoms were not related to her recent vascular repair with known endoleak.  Subsequent MPI was abnormal with a small reversible basal and mid inferior defect.  She subsequently underwent echo that showed low normal LVEF.  Cardiac cath demonstrated severe native vessel CAD with patent bypass grafts.  SVG to acute marginal at 60 to 70% stenosis, though this was not intervened upon given it did not appear severe and was supplying a small, diffusely diseased branch.  Imdur  was added, though this led to headaches.  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  LCIA and LEIA were stented along with left CFA/SFA endarterectomy with patch angioplasty, profundaplasty, and SFA embolectomy.  She was last seen in the office on 01/21/2023 and noted ongoing pain in the pelvis that typically radiated from the left lower pelvis around to her back.  Previous upper back and chest pain were resolved.  She noted fatigue as well.  Post procedure, [facility] decreased her carvedilol  to allow for some permissive hypertension and to minimize the risk for spinal cord hypoperfusion and injury.  She was on monotherapy with aspirin  81 mg (clopidogrel  was deferred  with mild thrombocytopenia).  BP at our office visit was elevated at 172/102, though she reported better readings at home typically in the 140s over 80s.  It was recommended to carvedilol  be escalated for management of hypertension as soon as vascular surgery felt it was not safe to do so.  She subsequently notified our office of episodes of epistaxis on clopidogrel  with recommendation to discuss this with [facility] vascular surgery who subsequently discontinued clopidogrel .  Followed up with [facility] vascular surgery on 02/01/2023 with improved blood pressure of 138/79.  Home BP readings were in the 140s over 80s.   She was most recently seen in the office on 03/12/2023 and had increased carvedilol  back to 12.5 mg twice daily following her [facility] appointment.  She continued to note left flank and left pelvic/groin discomfort following her vascular procedure.  Imaging in 03/2023 showed normal ABI bilaterally with patent left lower extremity stent.  She comes in today accompanied by her husband and is doing well from a cardiac perspective, no symptoms concerning for angina or cardiac decompensation.  She does continue to note some centralized chest discomfort that is nonexertional that predates her LHC in 12/2022.  She wonders if this is related to her sternotomy.  Discomfort is improving.  No dizziness, presyncope, or syncope.  She no longer is requiring supplemental oxygen, and her husband inquires about to have this order canceled.  Home blood pressure readings are largely in the 130s systolic with an occasional reading in the 140 to 150 mmHg systolic.  Has tolerated the addition amlodipine  5 mg (was previously on 10 mg).  Does note nuisance bruising on aspirin  325 mg and wonders if she can go back to 81 mg aspirin .  Weight remains stable at home.  She has follow-up with [facility] vascular surgery later this week.   Labs independently reviewed: 01/2023 - BUN 12, serum creatinine 0.72, potassium 3.8 Hgb 10.6, PLT 144, magnesium   2.0 11/2022 - albumin  3.9, AST/ALT not elevated 09/2022 - TC 145, TG 108, HDL 53, LDL 70, TSH normal 07/2022 - A1c 5.9, LP(a) 307.5  Past Medical History:  Diagnosis Date   Allergy    CAD (coronary artery disease)    a. s/p 4-V CABG (LIMA-LAD, VG-diag, VG-OM, VG-PDA) 07/2022   Hyperlipidemia    Hypertension    Thoracoabdominal aortic aneurysm (HCC)    Thyroid  disease     Past Surgical History:  Procedure Laterality Date   CORONARY ARTERY BYPASS GRAFT N/A 07/27/2022   Procedure: CORONARY ARTERY BYPASS GRAFTING (CABG) X4 USING LEFT INTERNAL MAMMARY ARTERY AND BILATERAL ENDOSCOPIC HARVESTED GREATER SAPHENOUS VEINS;  Surgeon: Lucas Dorise POUR, MD;  Location: [facility] OR;  Service: Open Heart Surgery;  Laterality: N/A;   LEFT HEART CATH AND CORONARY ANGIOGRAPHY N/A 07/23/2022   Procedure: LEFT HEART CATH AND CORONARY ANGIOGRAPHY;  Surgeon: Mady Bruckner, MD;  Location: [facility] INVASIVE CV LAB;  Service: Cardiovascular;  Laterality: N/A;   LEFT HEART CATH AND CORS/GRAFTS ANGIOGRAPHY N/A 12/17/2022   Procedure: LEFT HEART CATH AND CORS/GRAFTS ANGIOGRAPHY;  Surgeon: Mady Bruckner, MD;  Location: [facility] INVASIVE CV LAB;  Service: Cardiovascular;  Laterality: N/A;   TEE WITHOUT CARDIOVERSION N/A 07/27/2022   Procedure: TRANSESOPHAGEAL ECHOCARDIOGRAM;  Surgeon: Lucas Dorise POUR, MD;  Location: [facility] OR;  Service:  Open Heart Surgery;  Laterality: N/A;   TONSILLECTOMY     age 32    Current Medications: Current Meds  Medication Sig   acetaminophen  (TYLENOL ) 500 MG tablet Take 1,000 mg by mouth every 6 (six) hours as needed for moderate pain.   aspirin  325 MG tablet    carvedilol  (COREG ) 12.5 MG tablet Take 1 tablet (12.5 mg total) by mouth 2 (two) times daily.   levothyroxine  (SYNTHROID , LEVOTHROID) 75 MCG tablet Take 1 tablet (75 mcg total) by mouth daily.   rosuvastatin  (CRESTOR ) 20 MG tablet Take 1 tablet (20 mg total) by mouth daily.   venlafaxine  XR (EFFEXOR -XR) 150 MG 24 hr capsule Take 1 capsule (150 mg  total) by mouth daily.   [DISCONTINUED] amLODipine  (NORVASC ) 5 MG tablet Take 1 tablet (5 mg total) by mouth daily.   [DISCONTINUED] furosemide  (LASIX ) 20 MG tablet Take 1 tablet (20 mg total) by mouth daily.    Allergies:   Patient has no known allergies.   Social History   Socioeconomic History   Marital status: Married    Spouse name: Not on file   Number of children: 2   Years of education: Not on file   Highest education level: Not on file  Occupational History   Not on file  Tobacco Use   Smoking status: Former    Current packs/day: 0.25    Average packs/day: 0.3 packs/day for 50.0 years (12.5 ttl pk-yrs)    Types: Cigarettes    Passive exposure: Current   Smokeless tobacco: Never   Tobacco comments:    Stopped smoking 4/46/2024  Vaping Use   Vaping status: Never Used  Substance and Sexual Activity   Alcohol use: Not Currently   Drug use: Never   Sexual activity: Not on file  Other Topics Concern   Not on file  Social History Narrative   Engineer, Materials at [employer]   One grandson   Social Drivers of Health   Financial Resource Strain: Low Risk  (11/13/2022)   Received from [facility]   Overall Financial Resource Strain (CARDIA)    Difficulty of Paying Living Expenses: Not very hard  Food Insecurity: No Food Insecurity (11/13/2022)   Received from [facility]   Hunger Vital Sign    Worried About Running Out of Food in the Last Year: Never true    Ran Out of Food in the Last Year: Never true  Transportation Needs: No Transportation Needs (10/10/2022)   Received from [facility]   PRAPARE - Transportation    In the past 12 months, has lack of transportation kept you from medical appointments or from getting medications?: No    Lack of Transportation (Non-Medical): No  Physical Activity: Not on file  Stress: Not on file  Social Connections: Not on file     Family History:  The patient's family  history includes Breast cancer (age of onset: 66) in her maternal aunt; COPD in her mother; Cancer in her maternal aunt; Diabetes in her sister; Heart Problems in her maternal grandmother; Heart attack in her father; Stroke in her father.  ROS:   12-point review of systems is negative unless otherwise noted in the HPI.   EKGs/Labs/Other Studies Reviewed:    Studies reviewed were summarized above. The additional studies were reviewed today:  LHC 12/17/2022: Conclusions: Severe native three-vessel coronary artery disease, as detailed below, including total occlusions of the mid LAD, mid LCx, and mid RCA.  Distal RCA branches fill via left-to-right collaterals. Widely patent LIMA-LAD. Patent SVG-D with minimal luminal irregularities. Patent SVG-OM3 with graft ectasia and minimal luminal irregularities. Patent SVG-AM with long 60-70% stenosis in the proximal graft and supplying a small and diffusely disease branch. Mildly elevated left ventricular filling pressure (LVEDP 20 mmHg).   Recommendations: No culprit lesion identified for recent chest pain and dyspnea that began after TAAA repair with known endoleak.  Will add isosorbide  mononitrate 15 mg daily and furosemide  20 mg daily for medical therapy, including possible component of diastolic heart failure.  SVG-AM is not ideal for PCI, given long segment of moderate to severe disease and small, diffusely disease distal vessel. Continue aggressive secondary prevention of ASCVD. Proceed with staged TAAA repair per [facility] vascular surgery (I have contacted them regarding the findings of today's cath and my recommendations. ___________   Limited echo 12/14/2022: 1. Left ventricular ejection fraction, by estimation, is 50 to 55%. The  left ventricle has low normal function. The average left ventricular  global longitudinal strain is -11.7 %. The global longitudinal strain is  abnormal.   2. Right ventricular systolic function is normal. The right  ventricular  size is normal.   3. The mitral valve is normal in structure. Mild mitral valve  regurgitation.   4. The aortic valve is tricuspid. Aortic valve regurgitation is mild.  Aortic valve sclerosis is present, with no evidence of aortic valve  stenosis.   5. The inferior vena cava is normal in size with greater than 50%  respiratory variability, suggesting right atrial pressure of 3 mmHg.  __________   Lexiscan  MPI 12/07/2022:   Abnormal pharmacologic myocardial perfusion stress test.   There is a small in size, moderate in severity, partially reversible defect involving the basal and mid inferior segments consistent with scar and periinfarct ischemia.   Left ventricular systolic function is normal (LVEF 60-65%)   Post CABG findings are present as well as thoracoabdominal aortic aneurysm with endovascular graft in the descending aorta.   Atelectasis versus scarring noted in the left upper lobe.   The study is intermediate risk. __________   2D echo 07/23/2022: 1. Left ventricular ejection fraction, by estimation, is 55 to 60%. The  left ventricle has normal function. The left ventricle has no regional  wall motion abnormalities. There is mild concentric left ventricular  hypertrophy. Left ventricular diastolic  parameters are indeterminate.   2. Right ventricular systolic function is normal. The right ventricular  size is normal.   3. The mitral valve is normal in structure. Trivial mitral valve  regurgitation. No evidence of mitral stenosis.   4. The aortic valve is normal in structure. Aortic valve regurgitation is  not visualized. No aortic stenosis is present.   5. Abodminal aorta is dilated ( 3.3cm).   6. The inferior vena cava is normal in size with greater than 50%  respiratory variability, suggesting right atrial pressure of 3 mmHg.   7. Evidence of atrial level shunting detected by color flow Doppler.  Small secundum ASD noted 1.20 cm.  __________   [facility]  07/23/2022: Conclusions: Severe three-vessel coronary artery disease including sequential 70-90% proximal and mid LAD stenoses with aneurysmal segment at the takeoff of large first diagonal branch, 70-80% proximal/mid LCx stenosis, and chronic total occlusion of mid RCA with left-to-right collaterals. Mildly elevated left ventricular filling pressure (LVEDP 22 mmHg).  LVEF known to be normal based on recent echocardiogram at [facility].   Recommendations: Admit for antianginal therapy, as progressive atypical  left-sided chest pain is most likely the patient's anginal equivalent, as well as cardiac surgery consultation for CABG. Aggressive secondary prevention of coronary artery disease. __________   Coronary CTA with CT FFR 07/12/2022: IMPRESSION: 1. Descending thoracic aortic aneurysm this should be followed up with CTA of the chest for complete evaluation. 2. Otherwise no significant extracardiac incidental findings were seen.   FINDINGS: Aorta: Severe dilation of descending thoracic aorta, 56 mm in diameter, with severe mural atheroma. No dissection.   Aortic Valve:  Trileaflet. mild calcifications.   Coronary Arteries:  Normal coronary origin.  Right dominance.   RCA is a dominant artery. There is calcified and non calcified plaque proximally causing total occlusion in the proximal-mid segment (100%).   Left main gives rise to LAD and LCX arteries. LM has no disease.   LAD has calcified plaque proximally causing moderate stenosis (50-69%).   LCX is a non-dominant artery. There is calcified plaque in the proximal segment causing mild stenosis (25-49%).   Other findings:   Normal pulmonary vein drainage into the left atrium.   Normal left atrial appendage without a thrombus.   Normal size of the pulmonary artery.   IMPRESSION: 1. Coronary calcium  score of 1361. This was 99th percentile for age and sex matched control. 2. Normal coronary origin with right dominance. 3. CTO of  proximal to mid RCA. 4. Moderate proximal LAD stenosis (50-69%). 5. Mild proximal LCx stenosis (25-49%). 6. Known severe dilation of descending thoracic aorta, 56 mm in diameter, with severe mural atheroma. 7. CAD-RADS 5 Total coronary occlusion (100%). Consider cardiac catheterization or viability assessment. Consider symptom-guided anti-ischemic pharmacotherapy as well as risk factor modification per guideline directed care. 8. Will submit CT FFR to analyze proximal LAD. Will be reported separately.   1. Left Main:  No significant stenosis.   2. LAD: significant stenosis in the mid LAD.  FFRct 0.71 3. LCX: significant stenosis in the mid LCx.  FFRct 0.77 4. RCA: Total occlusion of mid RCA.   IMPRESSION: 1. CT FFR analysis showed significant stenosis in the mid LAD and LCx. 2.  Total occlusion of mid RCA. 3.  Cardiac catheterization recommended.   EKG:  EKG is not ordered today.   Recent Labs: 07/30/2022: Magnesium  2.3 11/23/2022: ALT 12 12/14/2022: BUN 12; Creatinine, Ser 0.85; Hemoglobin 10.0; Platelets 257; Potassium 3.8; Sodium 139  Recent Lipid Panel    Component Value Date/Time   CHOL 130 09/21/2022 1551   TRIG 111 09/21/2022 1551   HDL 52 09/21/2022 1551   CHOLHDL 2.5 09/21/2022 1551   VLDL 22 09/21/2022 1551   LDLCALC 56 09/21/2022 1551   LDLDIRECT 64 09/21/2022 1551    PHYSICAL EXAM:    VS:  BP 130/74 (BP Location: Left Arm, Patient Position: Sitting, Cuff Size: Normal)   Pulse 67   Ht 5' (1.524 m)   Wt 149 lb 9.6 oz (67.9 kg)   SpO2 98%   BMI 29.22 kg/m   BMI: Body mass index is 29.22 kg/m.  Physical Exam Vitals reviewed.  Constitutional:      Appearance: She is well-developed.  HENT:     Head: Normocephalic and atraumatic.  Eyes:     General:        Right eye: No discharge.        Left eye: No discharge.  Neck:     Vascular: No JVD.  Cardiovascular:     Rate and Rhythm: Normal rate and regular rhythm.     Heart sounds: Normal heart  sounds,  S1 normal and S2 normal. Heart sounds not distant. No midsystolic click and no opening snap. No murmur heard.    No friction rub.  Pulmonary:     Effort: Pulmonary effort is normal. No respiratory distress.     Breath sounds: Normal breath sounds. No decreased breath sounds, wheezing, rhonchi or rales.  Chest:     Chest wall: No tenderness.  Abdominal:     General: There is no distension.  Musculoskeletal:     Cervical back: Normal range of motion.     Right lower leg: No edema.     Left lower leg: No edema.  Skin:    General: Skin is warm and dry.     Nails: There is no clubbing.  Neurological:     Mental Status: She is alert and oriented to person, place, and time.  Psychiatric:        Speech: Speech normal.        Behavior: Behavior normal.        Thought Content: Thought content normal.        Judgment: Judgment normal.     Wt Readings from Last 3 Encounters:  04/23/23 149 lb 9.6 oz (67.9 kg)  03/12/23 145 lb 9.6 oz (66 kg)  01/21/23 146 lb (66.2 kg)     ASSESSMENT & PLAN:   CAD status post CABG with stable angina and HLD: She is without symptoms of angina or cardiac decompensation.  Recent LHC showed patent grafts with SVG to AM with a long 60 to 70% stenosis in the proximal graft supplying a small and diffusely diseased branch, without culprit lesion identified for recurrent chest pain.  The SVG to AM was not ideal for PCI given long segment of moderate to severe disease and small, diffusely diseased distal vessel.  She previously did not tolerate Imdur  secondary to headache.  Continue aggressive risk factor modification and secondary prevention aspirin  (okay for 81 mg from a cardiac perspective) and rosuvastatin .  LDL 70 in 09/2022.  Okay to participate with cardiac rehab.  No indication for further ischemic testing at this time.  HTN: Blood pressure is reasonably controlled in the office today.  Average blood pressure readings at home in the mid 130s over 70s with  occasional readings in the 140s and rare reading in the 150s systolic over the past several weeks.  Titrate amlodipine  to 10 mg daily (previous dose) with continuation of carvedilol  12.5 mg twice daily.  Low-sodium diet recommended.  Thoracoabdominal aortic aneurysm status post endovascular repair complicated by left iliac artery rupture and thrombosis of the left CFA/SFA: Left-sided discomfort improving.  Noninvasive vascular imaging from last month showed normal ABI bilaterally and patent stent.  She has follow-up with [facility] vascular surgery later this week.  History of hypoxic respiratory failure: Resolved.  Cardiology is unable to cancel the oxygen prescription.  Recommend she discuss this with the prescribing provider for PCP.  Transition furosemide  to as needed dosing.     Disposition: F/u with Dr. Mady or an APP in 2 months to reassess blood pressure.   Medication Adjustments/Labs and Tests Ordered: Current medicines are reviewed at length with the patient today.  Concerns regarding medicines are outlined above. Medication changes, Labs and Tests ordered today are summarized above and listed in the Patient Instructions accessible in Encounters.   Signed, Bernardino Bring, PA-C 04/23/2023 1:21 PM     [facility] [place] [address] [place], [place] [address] [phone]

## 2023-04-23 ENCOUNTER — Encounter: Payer: Self-pay | Admitting: Physician Assistant

## 2023-04-23 ENCOUNTER — Other Ambulatory Visit: Payer: Self-pay

## 2023-04-23 ENCOUNTER — Ambulatory Visit: Payer: PPO | Attending: Physician Assistant | Admitting: Physician Assistant

## 2023-04-23 ENCOUNTER — Other Ambulatory Visit (HOSPITAL_BASED_OUTPATIENT_CLINIC_OR_DEPARTMENT_OTHER): Payer: Self-pay

## 2023-04-23 ENCOUNTER — Other Ambulatory Visit (HOSPITAL_COMMUNITY): Payer: Self-pay

## 2023-04-23 VITALS — BP 130/74 | HR 67 | Ht 60.0 in | Wt 149.6 lb

## 2023-04-23 DIAGNOSIS — Z951 Presence of aortocoronary bypass graft: Secondary | ICD-10-CM | POA: Diagnosis not present

## 2023-04-23 DIAGNOSIS — I2510 Atherosclerotic heart disease of native coronary artery without angina pectoris: Secondary | ICD-10-CM | POA: Diagnosis not present

## 2023-04-23 DIAGNOSIS — I716 Thoracoabdominal aortic aneurysm, without rupture, unspecified: Secondary | ICD-10-CM

## 2023-04-23 DIAGNOSIS — I739 Peripheral vascular disease, unspecified: Secondary | ICD-10-CM

## 2023-04-23 DIAGNOSIS — E785 Hyperlipidemia, unspecified: Secondary | ICD-10-CM | POA: Diagnosis not present

## 2023-04-23 DIAGNOSIS — I10 Essential (primary) hypertension: Secondary | ICD-10-CM | POA: Diagnosis not present

## 2023-04-23 MED ORDER — FUROSEMIDE 20 MG PO TABS
20.0000 mg | ORAL_TABLET | Freq: Every day | ORAL | 3 refills | Status: AC | PRN
  Filled 2023-04-23 – 2023-04-29 (×2): qty 90, 90d supply, fill #0
  Filled 2023-07-29: qty 90, 90d supply, fill #1
  Filled 2023-12-08: qty 90, 90d supply, fill #2
  Filled 2024-02-23: qty 90, 90d supply, fill #3

## 2023-04-23 MED ORDER — AMLODIPINE BESYLATE 10 MG PO TABS
10.0000 mg | ORAL_TABLET | Freq: Every day | ORAL | 3 refills | Status: DC
  Filled 2023-04-23 – 2023-04-29 (×2): qty 90, 90d supply, fill #0
  Filled 2023-07-30: qty 90, 90d supply, fill #1
  Filled 2023-10-23: qty 90, 90d supply, fill #2
  Filled 2024-01-21: qty 90, 90d supply, fill #3

## 2023-04-23 NOTE — Patient Instructions (Signed)
 Medication Instructions:  Your physician recommends the following medication changes.  START TAKING: Lasix  10 mg as needed for shortness of breath  INCREASE: Amlodipine  10 mg daily   *If you need a refill on your cardiac medications before your next appointment, please call your pharmacy*   Lab Work: None ordered at this time  If you have labs (blood work) drawn today and your tests are completely normal, you will receive your results only by: MyChart Message (if you have MyChart) OR A paper copy in the mail If you have any lab test that is abnormal or we need to change your treatment, we will call you to review the results.   Follow-Up: At [facility], you and your health needs are our priority.  As part of our continuing mission to provide you with exceptional heart care, we have created designated Provider Care Teams.  These Care Teams include your primary Cardiologist (physician) and Advanced Practice Providers (APPs -  Physician Assistants and Nurse Practitioners) who all work together to provide you with the care you need, when you need it.   Your next appointment:   2 month(s)  Provider:   You may see Lonni Hanson, MD or one of the following Advanced Practice Providers on your designated Care Team:   Bernardino Bring, PA-C

## 2023-04-25 DIAGNOSIS — I716 Thoracoabdominal aortic aneurysm, without rupture, unspecified: Secondary | ICD-10-CM | POA: Diagnosis not present

## 2023-04-25 DIAGNOSIS — Z95828 Presence of other vascular implants and grafts: Secondary | ICD-10-CM | POA: Diagnosis not present

## 2023-04-25 DIAGNOSIS — Z09 Encounter for follow-up examination after completed treatment for conditions other than malignant neoplasm: Secondary | ICD-10-CM | POA: Diagnosis not present

## 2023-04-29 ENCOUNTER — Other Ambulatory Visit (HOSPITAL_COMMUNITY): Payer: Self-pay

## 2023-04-30 ENCOUNTER — Other Ambulatory Visit (HOSPITAL_COMMUNITY): Payer: Self-pay

## 2023-05-01 ENCOUNTER — Other Ambulatory Visit (HOSPITAL_COMMUNITY): Payer: Self-pay

## 2023-05-02 ENCOUNTER — Other Ambulatory Visit (HOSPITAL_COMMUNITY): Payer: Self-pay

## 2023-05-03 ENCOUNTER — Other Ambulatory Visit (HOSPITAL_COMMUNITY): Payer: Self-pay

## 2023-05-03 MED ORDER — LEVOTHYROXINE SODIUM 75 MCG PO TABS
75.0000 ug | ORAL_TABLET | Freq: Every morning | ORAL | 3 refills | Status: DC
  Filled 2023-05-07: qty 90, 90d supply, fill #0

## 2023-05-03 MED ORDER — VENLAFAXINE HCL ER 150 MG PO CP24
150.0000 mg | ORAL_CAPSULE | Freq: Every day | ORAL | 3 refills | Status: DC
  Filled 2023-05-07: qty 90, 90d supply, fill #0

## 2023-05-03 MED ORDER — CARVEDILOL 12.5 MG PO TABS
12.5000 mg | ORAL_TABLET | Freq: Two times a day (BID) | ORAL | 3 refills | Status: DC
  Filled 2023-05-07: qty 180, 90d supply, fill #0

## 2023-05-04 ENCOUNTER — Other Ambulatory Visit (HOSPITAL_COMMUNITY): Payer: Self-pay

## 2023-05-05 ENCOUNTER — Other Ambulatory Visit (HOSPITAL_COMMUNITY): Payer: Self-pay

## 2023-05-05 MED ORDER — AMLODIPINE BESYLATE 5 MG PO TABS
5.0000 mg | ORAL_TABLET | Freq: Every day | ORAL | 3 refills | Status: DC
  Filled 2023-05-05: qty 90, 90d supply, fill #0
  Filled 2023-07-29: qty 90, 90d supply, fill #1

## 2023-05-06 ENCOUNTER — Other Ambulatory Visit (HOSPITAL_COMMUNITY): Payer: Self-pay

## 2023-05-06 ENCOUNTER — Other Ambulatory Visit: Payer: Self-pay

## 2023-05-06 MED ORDER — OMEPRAZOLE 20 MG PO CPDR
20.0000 mg | DELAYED_RELEASE_CAPSULE | Freq: Every day | ORAL | 3 refills | Status: DC
  Filled 2023-05-06 (×2): qty 90, 90d supply, fill #0

## 2023-05-07 ENCOUNTER — Other Ambulatory Visit: Payer: Self-pay

## 2023-05-07 ENCOUNTER — Other Ambulatory Visit (HOSPITAL_COMMUNITY): Payer: Self-pay

## 2023-05-07 MED ORDER — POTASSIUM CHLORIDE CRYS ER 20 MEQ PO TBCR: 20.0000 meq | EXTENDED_RELEASE_TABLET | Freq: Every day | ORAL | 3 refills | Status: DC

## 2023-05-07 MED ORDER — FUROSEMIDE 40 MG PO TABS: 40.0000 mg | ORAL_TABLET | Freq: Every day | ORAL | 3 refills | Status: DC

## 2023-05-07 MED ORDER — CARVEDILOL 12.5 MG PO TABS
12.5000 mg | ORAL_TABLET | Freq: Two times a day (BID) | ORAL | 3 refills | Status: AC
  Filled 2023-05-07 – 2023-07-29 (×2): qty 180, 90d supply, fill #0
  Filled 2023-10-29: qty 180, 90d supply, fill #1
  Filled 2024-01-28: qty 180, 90d supply, fill #2
  Filled 2024-04-19: qty 180, 90d supply, fill #3

## 2023-05-07 MED ORDER — NITROGLYCERIN 0.4 MG SL SUBL: 0.4000 mg | SUBLINGUAL_TABLET | SUBLINGUAL | 3 refills | Status: DC

## 2023-05-22 ENCOUNTER — Other Ambulatory Visit (HOSPITAL_COMMUNITY): Payer: Self-pay

## 2023-06-17 ENCOUNTER — Encounter: Payer: PPO | Attending: Internal Medicine

## 2023-06-17 ENCOUNTER — Other Ambulatory Visit: Payer: Self-pay

## 2023-06-17 DIAGNOSIS — Z951 Presence of aortocoronary bypass graft: Secondary | ICD-10-CM | POA: Insufficient documentation

## 2023-06-17 DIAGNOSIS — Z48812 Encounter for surgical aftercare following surgery on the circulatory system: Secondary | ICD-10-CM | POA: Insufficient documentation

## 2023-06-17 NOTE — Progress Notes (Signed)
 Virtual Visit completed. Patient informed on EP and RD appointment and 6 Minute walk test. Patient also informed of patient health questionnaires on My Chart. Patient Verbalizes understanding. Visit diagnosis can be found in [facility] 03/12/2023.

## 2023-06-19 VITALS — Ht 61.2 in | Wt 156.1 lb

## 2023-06-19 DIAGNOSIS — Z951 Presence of aortocoronary bypass graft: Secondary | ICD-10-CM | POA: Diagnosis not present

## 2023-06-19 DIAGNOSIS — Z48812 Encounter for surgical aftercare following surgery on the circulatory system: Secondary | ICD-10-CM | POA: Diagnosis not present

## 2023-06-19 NOTE — Patient Instructions (Signed)
 Patient Instructions  Patient Details  Name: Kaylee Pope MRN: [id number] Date of Birth: [birth year] Referring Provider:  Yvonne Kendall, MD  Below are your personal goals for exercise, nutrition, and risk factors. Our goal is to help you stay on track towards obtaining and maintaining these goals. We will be discussing your progress on these goals with you throughout the program.  Initial Exercise Prescription:  Initial Exercise Prescription - 06/19/23 1500       Date of Initial Exercise RX and Referring Provider   Date 06/19/23    Referring Provider End, Cristal Deer, MD      Oxygen   Maintain Oxygen Saturation 88% or higher      Recumbant Bike   Level 2    RPM 80    Watts 25    Minutes 15    METs 2.42      NuStep   Level 2    SPM 80    Minutes 15    METs 2.42      REL-XR   Level 1    Speed 50    Minutes 15    METs 2.42      T5 Nustep   Level 2    SPM 80    Minutes 15    METs 2.42      Biostep-RELP   Level 2    SPM 50    Minutes 15    METs 2.42      Track   Laps 26    Minutes 15    METs 2.42      Prescription Details   Frequency (times per week) 3    Duration Progress to 30 minutes of continuous aerobic without signs/symptoms of physical distress      Intensity   THRR 40-80% of Max Heartrate 101-134    Ratings of Perceived Exertion 11-13    Perceived Dyspnea 0-4      Progression   Progression Continue to progress workloads to maintain intensity without signs/symptoms of physical distress.      Resistance Training   Training Prescription Yes    Weight 2 lb    Reps 10-15             Exercise Goals: Frequency: Be able to perform aerobic exercise two to three times per week in program working toward 2-5 days per week of home exercise.  Intensity: Work with a perceived exertion of 11 (fairly light) - 15 (hard) while following your exercise prescription.  We will make changes to your prescription with you as you progress through the  program.   Duration: Be able to do 30 to 45 minutes of continuous aerobic exercise in addition to a 5 minute warm-up and a 5 minute cool-down routine.   Nutrition Goals: Your personal nutrition goals will be established when you do your nutrition analysis with the dietician.  The following are general nutrition guidelines to follow: Cholesterol < 200mg /day Sodium < 1500mg /day Fiber: Women over 50 yrs - 21 grams per day  Personal Goals:  Personal Goals and Risk Factors at Admission - 06/19/23 1603       Core Components/Risk Factors/Patient Goals on Admission    Weight Management Yes;Weight Loss    Intervention Weight Management: Develop a combined nutrition and exercise program designed to reach desired caloric intake, while maintaining appropriate intake of nutrient and fiber, sodium and fats, and appropriate energy expenditure required for the weight goal.;Weight Management: Provide education and appropriate resources to help participant work on and attain  dietary goals.;Weight Management/Obesity: Establish reasonable short term and long term weight goals.    Admit Weight 156 lb 1.6 oz (70.8 kg)    Goal Weight: Short Term 148 lb 8 oz (67.4 kg)    Goal Weight: Long Term 141 lb (64 kg)    Expected Outcomes Short Term: Continue to assess and modify interventions until short term weight is achieved;Weight Loss: Understanding of general recommendations for a balanced deficit meal plan, which promotes 1-2 lb weight loss per week and includes a negative energy balance of [id number] kcal/d;Understanding recommendations for meals to include 15-35% energy as protein, 25-35% energy from fat, 35-60% energy from carbohydrates, less than 200mg  of dietary cholesterol, 20-35 gm of total fiber daily;Understanding of distribution of calorie intake throughout the day with the consumption of 4-5 meals/snacks;Long Term: Adherence to nutrition and physical activity/exercise program aimed toward attainment of  established weight goal    Hypertension Yes    Intervention Provide education on lifestyle modifcations including regular physical activity/exercise, weight management, moderate sodium restriction and increased consumption of fresh fruit, vegetables, and low fat dairy, alcohol moderation, and smoking cessation.;Monitor prescription use compliance.    Expected Outcomes Long Term: Maintenance of blood pressure at goal levels.;Short Term: Continued assessment and intervention until BP is < 140/65mm HG in hypertensive participants. < 130/58mm HG in hypertensive participants with diabetes, heart failure or chronic kidney disease.    Lipids Yes    Intervention Provide education and support for participant on nutrition & aerobic/resistive exercise along with prescribed medications to achieve LDL 70mg , HDL >40mg .    Expected Outcomes Short Term: Participant states understanding of desired cholesterol values and is compliant with medications prescribed. Participant is following exercise prescription and nutrition guidelines.;Long Term: Cholesterol controlled with medications as prescribed, with individualized exercise RX and with personalized nutrition plan. Value goals: LDL < 70mg , HDL > 40 mg.             Tobacco Use Initial Evaluation: Social History   Tobacco Use  Smoking Status Former   Current packs/day: 0.25   Average packs/day: 0.3 packs/day for 50.0 years (12.5 ttl pk-yrs)   Types: Cigarettes   Passive exposure: Current  Smokeless Tobacco Never  Tobacco Comments   Stopped smoking 4/46/2024    Exercise Goals and Review:  Exercise Goals     Row Name 06/19/23 1600             Exercise Goals   Increase Physical Activity Yes       Intervention Provide advice, education, support and counseling about physical activity/exercise needs.;Develop an individualized exercise prescription for aerobic and resistive training based on initial evaluation findings, risk stratification,  comorbidities and participant's personal goals.       Expected Outcomes Short Term: Attend rehab on a regular basis to increase amount of physical activity.;Long Term: Add in home exercise to make exercise part of routine and to increase amount of physical activity.;Long Term: Exercising regularly at least 3-5 days a week.       Increase Strength and Stamina Yes       Intervention Provide advice, education, support and counseling about physical activity/exercise needs.;Develop an individualized exercise prescription for aerobic and resistive training based on initial evaluation findings, risk stratification, comorbidities and participant's personal goals.       Expected Outcomes Short Term: Increase workloads from initial exercise prescription for resistance, speed, and METs.;Short Term: Perform resistance training exercises routinely during rehab and add in resistance training at home;Long Term: Improve cardiorespiratory fitness, muscular  endurance and strength as measured by increased METs and functional capacity ( )       Able to understand and use rate of perceived exertion (RPE) scale Yes       Intervention Provide education and explanation on how to use RPE scale       Expected Outcomes Short Term: Able to use RPE daily in rehab to express subjective intensity level;Long Term:  Able to use RPE to guide intensity level when exercising independently       Able to understand and use Dyspnea scale Yes       Intervention Provide education and explanation on how to use Dyspnea scale       Expected Outcomes Short Term: Able to use Dyspnea scale daily in rehab to express subjective sense of shortness of breath during exertion;Long Term: Able to use Dyspnea scale to guide intensity level when exercising independently       Knowledge and understanding of Target Heart Rate Range (THRR) Yes       Intervention Provide education and explanation of THRR including how the numbers were predicted and where they  are located for reference       Expected Outcomes Short Term: Able to state/look up THRR;Short Term: Able to use daily as guideline for intensity in rehab;Long Term: Able to use THRR to govern intensity when exercising independently       Able to check pulse independently Yes       Intervention Provide education and demonstration on how to check pulse in carotid and radial arteries.;Review the importance of being able to check your own pulse for safety during independent exercise       Expected Outcomes Short Term: Able to explain why pulse checking is important during independent exercise;Long Term: Able to check pulse independently and accurately       Understanding of Exercise Prescription Yes       Intervention Provide education, explanation, and written materials on patient's individual exercise prescription       Expected Outcomes Short Term: Able to explain program exercise prescription;Long Term: Able to explain home exercise prescription to exercise independently

## 2023-06-19 NOTE — Progress Notes (Signed)
 Cardiac Individual Treatment Plan  Patient Details  Name: Kaylee Pope MRN: [id number] Date of Birth: Sep 04, 1955 Referring Provider:   Flowsheet Row Cardiac Rehab from 06/19/2023 in [facility] Cardiac and Pulmonary Rehab  Referring Provider End, Cristal Deer, MD       Initial Encounter Date:  Flowsheet Row Cardiac Rehab from 06/19/2023 in [facility] Cardiac and Pulmonary Rehab  Date 06/19/23       Visit Diagnosis: S/P CABG x 4  Patient's Home Medications on Admission:  Current Outpatient Medications:    acetaminophen (TYLENOL) 500 MG tablet, Take 1,000 mg by mouth every 6 (six) hours as needed for moderate pain., Disp: , Rfl:    ALPRAZolam (XANAX) 0.5 MG tablet, Take 0.5 mg by mouth daily as needed. PRN, Disp: , Rfl:    ALPRAZolam (XANAX) 0.5 MG tablet, Take by mouth. (Patient not taking: Reported on 06/17/2023), Disp: , Rfl:    amLODipine (NORVASC) 10 MG tablet, Take 1 tablet (10 mg total) by mouth daily., Disp: 90 tablet, Rfl: 3   amLODipine (NORVASC) 5 MG tablet, Take 1 tablet (5 mg total) by mouth daily., Disp: 90 tablet, Rfl: 3   aspirin 325 MG tablet, , Disp: , Rfl:    aspirin EC 81 MG tablet, Take 81 mg by mouth daily. Swallow whole. (Patient not taking: Reported on 04/23/2023), Disp: , Rfl:    carvedilol (COREG) 12.5 MG tablet, Take 1 tablet (12.5 mg total) by mouth 2 (two) times daily., Disp: 180 tablet, Rfl: 3   furosemide (LASIX) 20 MG tablet, Take 1 tablet (20 mg total) by mouth daily as needed (shortness of breath)., Disp: 90 tablet, Rfl: 3   furosemide (LASIX) 40 MG tablet, Take 1 tablet (40 mg total) by mouth daily., Disp: 90 tablet, Rfl: 3   levothyroxine (SYNTHROID) 75 MCG tablet, Take 1 tablet (75 mcg total) by mouth on empty stomach with a glass of water at least 30-60 minutes before breakfast., Disp: 90 tablet, Rfl: 3   levothyroxine (SYNTHROID, LEVOTHROID) 75 MCG tablet, Take 1 tablet (75 mcg total) by mouth daily. (Patient not taking: Reported on 06/17/2023), Disp: 90  tablet, Rfl: 0   nitroGLYCERIN (NITROSTAT) 0.4 MG SL tablet, Place 1 tablet (0.4 mg total) under the tongue and allow to dissolve every 5 minutes as needed for chest pain. no more than 3 tabs in one day., Disp: 25 tablet, Rfl: 3   omeprazole (PRILOSEC) 20 MG capsule, Take 20 mg by mouth daily. (Patient not taking: Reported on 03/12/2023), Disp: , Rfl:    omeprazole (PRILOSEC) 20 MG capsule, Take 1 capsule (20 mg total) by mouth daily., Disp: 90 capsule, Rfl: 3   ondansetron (ZOFRAN-ODT) 4 MG disintegrating tablet, Take 1 tablet (4 mg total) by mouth every 8 (eight) hours as needed. (Patient not taking: Reported on 03/12/2023), Disp: 20 tablet, Rfl: 0   potassium chloride SA (KLOR-CON M) 20 MEQ tablet, Take 1 tablet (20 mEq total) by mouth daily., Disp: 90 tablet, Rfl: 3   rosuvastatin (CRESTOR) 20 MG tablet, Take 1 tablet (20 mg total) by mouth daily., Disp: 90 tablet, Rfl: 3   traMADol (ULTRAM) 50 MG tablet, Take 1 tablet (50 mg total) by mouth every 8 (eight) hours as needed. (Patient not taking: Reported on 03/12/2023), Disp: 15 tablet, Rfl: 0   venlafaxine XR (EFFEXOR-XR) 150 MG 24 hr capsule, Take 1 capsule (150 mg total) by mouth daily. (Patient not taking: Reported on 06/17/2023), Disp: 90 capsule, Rfl: 0   venlafaxine XR (EFFEXOR-XR) 150 MG 24 hr  capsule, Take 1 capsule (150 mg total) by mouth daily., Disp: 90 capsule, Rfl: 3  Past Medical History: Past Medical History:  Diagnosis Date   Allergy    CAD (coronary artery disease)    a. s/p 4-V CABG (LIMA-LAD, VG-diag, VG-OM, VG-PDA) 07/2022   Hyperlipidemia    Hypertension    Thoracoabdominal aortic aneurysm (HCC)    Thyroid disease     Tobacco Use: Social History   Tobacco Use  Smoking Status Former   Current packs/day: 0.25   Average packs/day: 0.3 packs/day for 50.0 years (12.5 ttl pk-yrs)   Types: Cigarettes   Passive exposure: Current  Smokeless Tobacco Never  Tobacco Comments   Stopped smoking 4/46/2024    Labs: Review  Flowsheet  More data exists      Latest Ref Rng & Units 10/20/2014 07/26/2022 07/27/2022 07/28/2022 09/21/2022  Labs for ITP Cardiac and Pulmonary Rehab  Cholestrol 0 - 200 mg/dL 147  - - - 829   LDL (calc) 0 - 99 mg/dL - - - - 56   Direct LDL 0 - 99 mg/dL 562.1  - - - 64   HDL-C >40 mg/dL 30.86  - - - 52   Trlycerides <150 mg/dL 578.4  - - - 696   Hemoglobin A1c 4.8 - 5.6 % - - 5.9  - -  PH, Arterial 7.35 - 7.45 - 7.43  7.295  7.278  7.421  7.355  7.371  7.267  7.302  7.288  -  PCO2 arterial 32 - 48 mmHg - 47  54.6  53.2  38.5  43.1  48.3  52.1  50.6  56.6  -  Bicarbonate 20.0 - 28.0 mmol/L - 31.9  26.7  25.2  25.0  24.0  28.0  23.7  25.0  27.2  -  TCO2 22 - 32 mmol/L - - 28  27  28  26  27  28  25  31  31  29  25  27  29   -  Acid-base deficit 0.0 - 2.0 mmol/L - - 2.0  1.0  3.0  2.0  -  O2 Saturation % - 93.2  97  98  100  100  100  95  98  97  -    Details       Multiple values from one day are sorted in reverse-chronological order          Exercise Target Goals: Exercise Program Goal: Individual exercise prescription set using results from initial 6 min walk test and THRR while considering  patient's activity barriers and safety.   Exercise Prescription Goal: Initial exercise prescription builds to 30-45 minutes a day of aerobic activity, 2-3 days per week.  Home exercise guidelines will be given to patient during program as part of exercise prescription that the participant will acknowledge.   Education: Aerobic Exercise: - Group verbal and visual presentation on the components of exercise prescription. Introduces F.I.T.T principle from ACSM for exercise prescriptions.  Reviews F.I.T.T. principles of aerobic exercise including progression. Written material given at graduation. Flowsheet Row Cardiac Rehab from 06/19/2023 in [facility] Cardiac and Pulmonary Rehab  Education need identified 06/19/23       Education: Resistance Exercise: - Group verbal and visual presentation on the  components of exercise prescription. Introduces F.I.T.T principle from ACSM for exercise prescriptions  Reviews F.I.T.T. principles of resistance exercise including progression. Written material given at graduation.    Education: Exercise & Equipment Safety: - Individual verbal instruction and demonstration of equipment  use and safety with use of the equipment. Flowsheet Row Cardiac Rehab from 06/19/2023 in [facility] Cardiac and Pulmonary Rehab  Date 06/19/23  Educator MB  Instruction Review Code 1- Verbalizes Understanding       Education: Exercise Physiology & General Exercise Guidelines: - Group verbal and written instruction with models to review the exercise physiology of the cardiovascular system and associated critical values. Provides general exercise guidelines with specific guidelines to those with heart or lung disease.    Education: Flexibility, Balance, Mind/Body Relaxation: - Group verbal and visual presentation with interactive activity on the components of exercise prescription. Introduces F.I.T.T principle from ACSM for exercise prescriptions. Reviews F.I.T.T. principles of flexibility and balance exercise training including progression. Also discusses the mind body connection.  Reviews various relaxation techniques to help reduce and manage stress (i.e. Deep breathing, progressive muscle relaxation, and visualization). Balance handout provided to take home. Written material given at graduation.   Activity Barriers & Risk Stratification:  Activity Barriers & Cardiac Risk Stratification - 06/19/23 1550       Activity Barriers & Cardiac Risk Stratification   Activity Barriers Shortness of Breath;Other (comment)    Comments Tightness in chest, not sure if incisional pain/scar tissue or angina.    Cardiac Risk Stratification High             6 Minute Walk:  6 Minute Walk     Row Name 06/19/23 1548         6 Minute Walk   Phase Initial     Distance 995 feet      Walk Time 5.77 minutes     # of Rest Breaks 1     MPH 1.96     METS 2.42     RPE 15     Perceived Dyspnea  1     VO2 Peak 8.47     Symptoms Yes (comment)     Comments Bilateral hip pain (8/10)     Resting HR 68 bpm     Resting BP 136/70     Resting Oxygen Saturation  97 %     Exercise Oxygen Saturation  during 6 min walk 94 %     Max Ex. HR 100 bpm     Max Ex. BP 158/80     2 Minute Post BP 150/80              Oxygen Initial Assessment:   Oxygen Re-Evaluation:   Oxygen Discharge (Final Oxygen Re-Evaluation):   Initial Exercise Prescription:  Initial Exercise Prescription - 06/19/23 1500       Date of Initial Exercise RX and Referring Provider   Date 06/19/23    Referring Provider End, Cristal Deer, MD      Oxygen   Maintain Oxygen Saturation 88% or higher      Recumbant Bike   Level 2    RPM 80    Watts 25    Minutes 15    METs 2.42      NuStep   Level 2    SPM 80    Minutes 15    METs 2.42      REL-XR   Level 1    Speed 50    Minutes 15    METs 2.42      T5 Nustep   Level 2    SPM 80    Minutes 15    METs 2.42      Biostep-RELP   Level 2    SPM 50  Minutes 15    METs 2.42      Track   Laps 26    Minutes 15    METs 2.42      Prescription Details   Frequency (times per week) 3    Duration Progress to 30 minutes of continuous aerobic without signs/symptoms of physical distress      Intensity   THRR 40-80% of Max Heartrate 101-134    Ratings of Perceived Exertion 11-13    Perceived Dyspnea 0-4      Progression   Progression Continue to progress workloads to maintain intensity without signs/symptoms of physical distress.      Resistance Training   Training Prescription Yes    Weight 2 lb    Reps 10-15             Perform Capillary Blood Glucose checks as needed.  Exercise Prescription Changes:   Exercise Prescription Changes     Row Name 06/19/23 1500             Response to Exercise   Blood Pressure  (Admit) 136/70       Blood Pressure (Exercise) 158/80       Blood Pressure (Exit) 130/80       Heart Rate (Admit) 68 bpm       Heart Rate (Exercise) 100 bpm       Heart Rate (Exit) 71 bpm       Oxygen Saturation (Admit) 97 %       Oxygen Saturation (Exercise) 94 %       Oxygen Saturation (Exit) 96 %       Rating of Perceived Exertion (Exercise) 15       Perceived Dyspnea (Exercise) 1       Symptoms Bilateral hip pain 8/10       Comments results       Duration Progress to 30 minutes of  aerobic without signs/symptoms of physical distress       Intensity THRR New         Progression   Progression Continue to progress workloads to maintain intensity without signs/symptoms of physical distress.       Average METs 2.42                Exercise Comments:   Exercise Goals and Review:   Exercise Goals     Row Name 06/19/23 1600             Exercise Goals   Increase Physical Activity Yes       Intervention Provide advice, education, support and counseling about physical activity/exercise needs.;Develop an individualized exercise prescription for aerobic and resistive training based on initial evaluation findings, risk stratification, comorbidities and participant's personal goals.       Expected Outcomes Short Term: Attend rehab on a regular basis to increase amount of physical activity.;Long Term: Add in home exercise to make exercise part of routine and to increase amount of physical activity.;Long Term: Exercising regularly at least 3-5 days a week.       Increase Strength and Stamina Yes       Intervention Provide advice, education, support and counseling about physical activity/exercise needs.;Develop an individualized exercise prescription for aerobic and resistive training based on initial evaluation findings, risk stratification, comorbidities and participant's personal goals.       Expected Outcomes Short Term: Increase workloads from initial exercise prescription for  resistance, speed, and METs.;Short Term: Perform resistance training exercises routinely during rehab and add in  resistance training at home;Long Term: Improve cardiorespiratory fitness, muscular endurance and strength as measured by increased METs and functional capacity ( )       Able to understand and use rate of perceived exertion (RPE) scale Yes       Intervention Provide education and explanation on how to use RPE scale       Expected Outcomes Short Term: Able to use RPE daily in rehab to express subjective intensity level;Long Term:  Able to use RPE to guide intensity level when exercising independently       Able to understand and use Dyspnea scale Yes       Intervention Provide education and explanation on how to use Dyspnea scale       Expected Outcomes Short Term: Able to use Dyspnea scale daily in rehab to express subjective sense of shortness of breath during exertion;Long Term: Able to use Dyspnea scale to guide intensity level when exercising independently       Knowledge and understanding of Target Heart Rate Range (THRR) Yes       Intervention Provide education and explanation of THRR including how the numbers were predicted and where they are located for reference       Expected Outcomes Short Term: Able to state/look up THRR;Short Term: Able to use daily as guideline for intensity in rehab;Long Term: Able to use THRR to govern intensity when exercising independently       Able to check pulse independently Yes       Intervention Provide education and demonstration on how to check pulse in carotid and radial arteries.;Review the importance of being able to check your own pulse for safety during independent exercise       Expected Outcomes Short Term: Able to explain why pulse checking is important during independent exercise;Long Term: Able to check pulse independently and accurately       Understanding of Exercise Prescription Yes       Intervention Provide education, explanation,  and written materials on patient's individual exercise prescription       Expected Outcomes Short Term: Able to explain program exercise prescription;Long Term: Able to explain home exercise prescription to exercise independently                Exercise Goals Re-Evaluation :   Discharge Exercise Prescription (Final Exercise Prescription Changes):  Exercise Prescription Changes - 06/19/23 1500       Response to Exercise   Blood Pressure (Admit) 136/70    Blood Pressure (Exercise) 158/80    Blood Pressure (Exit) 130/80    Heart Rate (Admit) 68 bpm    Heart Rate (Exercise) 100 bpm    Heart Rate (Exit) 71 bpm    Oxygen Saturation (Admit) 97 %    Oxygen Saturation (Exercise) 94 %    Oxygen Saturation (Exit) 96 %    Rating of Perceived Exertion (Exercise) 15    Perceived Dyspnea (Exercise) 1    Symptoms Bilateral hip pain 8/10    Comments results    Duration Progress to 30 minutes of  aerobic without signs/symptoms of physical distress    Intensity THRR New      Progression   Progression Continue to progress workloads to maintain intensity without signs/symptoms of physical distress.    Average METs 2.42             Nutrition:  Target Goals: Understanding of nutrition guidelines, daily intake of sodium 1500mg , cholesterol 200mg , calories 30% from fat and 7% or  less from saturated fats, daily to have 5 or more servings of fruits and vegetables.  Education: All About Nutrition: -Group instruction provided by verbal, written material, interactive activities, discussions, models, and posters to present general guidelines for heart healthy nutrition including fat, fiber, MyPlate, the role of sodium in heart healthy nutrition, utilization of the nutrition label, and utilization of this knowledge for meal planning. Follow up email sent as well. Written material given at graduation.   Biometrics:  Pre Biometrics - 06/19/23 1601       Pre Biometrics   Height 5' 1.2"  (1.554 m)    Weight 156 lb 1.6 oz (70.8 kg)    Waist Circumference 36.5 inches    Hip Circumference 38.2 inches    Waist to Hip Ratio 0.96 %    BMI (Calculated) 29.32    Single Leg Stand 7 seconds              Nutrition Therapy Plan and Nutrition Goals:  Nutrition Therapy & Goals - 06/19/23 1602       Personal Nutrition Goals   Nutrition Goal Will meet with RD in the next few weeks      Intervention Plan   Intervention Prescribe, educate and counsel regarding individualized specific dietary modifications aiming towards targeted core components such as weight, hypertension, lipid management, diabetes, heart failure and other comorbidities.;Nutrition handout(s) given to patient.    Expected Outcomes Short Term Goal: Understand basic principles of dietary content, such as calories, fat, sodium, cholesterol and nutrients.;Short Term Goal: A plan has been developed with personal nutrition goals set during dietitian appointment.;Long Term Goal: Adherence to prescribed nutrition plan.             Nutrition Assessments:  MEDIFICTS Score Key: >=70 Need to make dietary changes  40-70 Heart Healthy Diet <= 40 Therapeutic Level Cholesterol Diet  Flowsheet Row Cardiac Rehab from 06/19/2023 in [facility] Cardiac and Pulmonary Rehab  Picture Your Plate Total Score on Admission 53      Picture Your Plate Scores: <81 Unhealthy dietary pattern with much room for improvement. 41-50 Dietary pattern unlikely to meet recommendations for good health and room for improvement. 51-60 More healthful dietary pattern, with some room for improvement.  >60 Healthy dietary pattern, although there may be some specific behaviors that could be improved.    Nutrition Goals Re-Evaluation:   Nutrition Goals Discharge (Final Nutrition Goals Re-Evaluation):   Psychosocial: Target Goals: Acknowledge presence or absence of significant depression and/or stress, maximize coping skills, provide positive  support system. Participant is able to verbalize types and ability to use techniques and skills needed for reducing stress and depression.   Education: Stress, Anxiety, and Depression - Group verbal and visual presentation to define topics covered.  Reviews how body is impacted by stress, anxiety, and depression.  Also discusses healthy ways to reduce stress and to treat/manage anxiety and depression.  Written material given at graduation.   Education: Sleep Hygiene -Provides group verbal and written instruction about how sleep can affect your health.  Define sleep hygiene, discuss sleep cycles and impact of sleep habits. Review good sleep hygiene tips.    Initial Review & Psychosocial Screening:  Initial Psych Review & Screening - 06/17/23 0954       Initial Review   Current issues with Current Depression;History of Depression;Current Psychotropic Meds;Current Stress Concerns    Source of Stress Concerns Chronic Illness    Comments Meela is working on her anxiety since she has had alot of procedures  and health issues.      Family Dynamics   Good Support System? Yes    Comments She can look to her children, husband and preacher for support.      Barriers   Psychosocial barriers to participate in program The patient should benefit from training in stress management and relaxation.      Screening Interventions   Interventions Provide feedback about the scores to participant;To provide support and resources with identified psychosocial needs;Encouraged to exercise    Expected Outcomes Short Term goal: Utilizing psychosocial counselor, staff and physician to assist with identification of specific Stressors or current issues interfering with healing process. Setting desired goal for each stressor or current issue identified.;Long Term Goal: Stressors or current issues are controlled or eliminated.;Long Term goal: The participant improves quality of Life and PHQ9 Scores as seen by post scores  and/or verbalization of changes;Short Term goal: Identification and review with participant of any Quality of Life or Depression concerns found by scoring the questionnaire.             Quality of Life Scores:   Quality of Life - 06/19/23 1602       Quality of Life   Select Quality of Life      Quality of Life Scores   Health/Function Pre 18.8 %    Socioeconomic Pre 21.75 %    Psych/Spiritual Pre 19.86 %    Family Pre 22.8 %    GLOBAL Pre 20.26 %            Scores of 19 and below usually indicate a poorer quality of life in these areas.  A difference of  2-3 points is a clinically meaningful difference.  A difference of 2-3 points in the total score of the Quality of Life Index has been associated with significant improvement in overall quality of life, self-image, physical symptoms, and general health in studies assessing change in quality of life.  PHQ-9: Review Flowsheet       06/19/2023  Depression screen PHQ 2/9  Decreased Interest 1  Down, Depressed, Hopeless 1  PHQ - 2 Score 2  Altered sleeping 2  Tired, decreased energy 2  Change in appetite 0  Feeling bad or failure about yourself  0  Trouble concentrating 0  Moving slowly or fidgety/restless 0  Suicidal thoughts 0  PHQ-9 Score 6  Difficult doing work/chores Somewhat difficult   Interpretation of Total Score  Total Score Depression Severity:  1-4 = Minimal depression, 5-9 = Mild depression, 10-14 = Moderate depression, 15-19 = Moderately severe depression, 20-27 = Severe depression   Psychosocial Evaluation and Intervention:  Psychosocial Evaluation - 06/17/23 0957       Psychosocial Evaluation & Interventions   Interventions Encouraged to exercise with the program and follow exercise prescription;Relaxation education;Stress management education    Comments Avin is working on her anxiety since she has had alot of procedures and health issues.She can look to her children, husband and preacher for  support.    Expected Outcomes Short: Attend HeartTrack stress management education to decrease stress. Long: Maintain exercise Post HeartTrack to keep stress at a minimum.    Continue Psychosocial Services  Follow up required by staff             Psychosocial Re-Evaluation:   Psychosocial Discharge (Final Psychosocial Re-Evaluation):   Vocational Rehabilitation: Provide vocational rehab assistance to qualifying candidates.   Vocational Rehab Evaluation & Intervention:   Education: Education Goals: Education classes will be provided on  a variety of topics geared toward better understanding of heart health and risk factor modification. Participant will state understanding/return demonstration of topics presented as noted by education test scores.  Learning Barriers/Preferences:  Learning Barriers/Preferences - 06/17/23 0953       Learning Barriers/Preferences   Learning Barriers None;Sight    Learning Preferences None             General Cardiac Education Topics:  AED/CPR: - Group verbal and written instruction with the use of models to demonstrate the basic use of the AED with the basic ABC's of resuscitation.   Anatomy and Cardiac Procedures: - Group verbal and visual presentation and models provide information about basic cardiac anatomy and function. Reviews the testing methods done to diagnose heart disease and the outcomes of the test results. Describes the treatment choices: Medical Management, Angioplasty, or Coronary Bypass Surgery for treating various heart conditions including Myocardial Infarction, Angina, Valve Disease, and Cardiac Arrhythmias.  Written material given at graduation. Flowsheet Row Cardiac Rehab from 06/19/2023 in [facility] Cardiac and Pulmonary Rehab  Education need identified 06/19/23       Medication Safety: - Group verbal and visual instruction to review commonly prescribed medications for heart and lung disease. Reviews the medication,  class of the drug, and side effects. Includes the steps to properly store meds and maintain the prescription regimen.  Written material given at graduation.   Intimacy: - Group verbal instruction through game format to discuss how heart and lung disease can affect sexual intimacy. Written material given at graduation..   Know Your Numbers and Heart Failure: - Group verbal and visual instruction to discuss disease risk factors for cardiac and pulmonary disease and treatment options.  Reviews associated critical values for Overweight/Obesity, Hypertension, Cholesterol, and Diabetes.  Discusses basics of heart failure: signs/symptoms and treatments.  Introduces Heart Failure Zone chart for action plan for heart failure.  Written material given at graduation.   Infection Prevention: - Provides verbal and written material to individual with discussion of infection control including proper hand washing and proper equipment cleaning during exercise session. Flowsheet Row Cardiac Rehab from 06/19/2023 in [facility] [facility]  Date 06/19/23  Educator MB  Instruction Review Code 1- Verbalizes Understanding       Falls Prevention: - Provides verbal and written material to individual with discussion of falls prevention and safety. Flowsheet [facility] from 06/19/2023 in [facility]  Date 06/19/23  Educator MB  Instruction Review Code 1- Verbalizes Understanding       Other: -Provides group and verbal instruction on various topics (see comments)   Knowledge Questionnaire Score:  Knowledge Questionnaire Score - 06/19/23 1603       Knowledge Questionnaire Score   Pre Score 23/26             Core Components/Risk Factors/Patient Goals at Admission:  Personal Goals and Risk Factors at Admission - 06/19/23 1603       Core Components/Risk Factors/Patient Goals on Admission    Weight Management Yes;Weight Loss    Intervention Weight Management:  Develop a combined nutrition and exercise program designed to reach desired caloric intake, while maintaining appropriate intake of nutrient and fiber, sodium and fats, and appropriate energy expenditure required for the weight goal.;Weight Management: Provide education and appropriate resources to help participant work on and attain dietary goals.;Weight Management/Obesity: Establish reasonable short term and long term weight goals.    Admit Weight 156 lb 1.6 oz (70.8 kg)  Goal Weight: Short Term 148 lb 8 oz (67.4 kg)    Goal Weight: Long Term 141 lb (64 kg)    Expected Outcomes Short Term: Continue to assess and modify interventions until short term weight is achieved;Weight Loss: Understanding of general recommendations for a balanced deficit meal plan, which promotes 1-2 lb weight loss per week and includes a negative energy balance of [phone] kcal/d;Understanding recommendations for meals to include 15-35% energy as protein, 25-35% energy from fat, 35-60% energy from carbohydrates, less than 200mg  of dietary cholesterol, 20-35 gm of total fiber daily;Understanding of distribution of calorie intake throughout the day with the consumption of 4-5 meals/snacks;Long Term: Adherence to nutrition and physical activity/exercise program aimed toward attainment of established weight goal    Hypertension Yes    Intervention Provide education on lifestyle modifcations including regular physical activity/exercise, weight management, moderate sodium restriction and increased consumption of fresh fruit, vegetables, and low fat dairy, alcohol moderation, and smoking cessation.;Monitor prescription use compliance.    Expected Outcomes Long Term: Maintenance of blood pressure at goal levels.;Short Term: Continued assessment and intervention until BP is < 140/74mm HG in hypertensive participants. < 130/3mm HG in hypertensive participants with diabetes, heart failure or chronic kidney disease.    Lipids Yes     Intervention Provide education and support for participant on nutrition & aerobic/resistive exercise along with prescribed medications to achieve LDL 70mg , HDL >40mg .    Expected Outcomes Short Term: Participant states understanding of desired cholesterol values and is compliant with medications prescribed. Participant is following exercise prescription and nutrition guidelines.;Long Term: Cholesterol controlled with medications as prescribed, with individualized exercise RX and with personalized nutrition plan. Value goals: LDL < 70mg , HDL > 40 mg.             Education:Diabetes - Individual verbal and written instruction to review signs/symptoms of diabetes, desired ranges of glucose level fasting, after meals and with exercise. Acknowledge that pre and post exercise glucose checks will be done for 3 sessions at entry of program.   Core Components/Risk Factors/Patient Goals Review:    Core Components/Risk Factors/Patient Goals at Discharge (Final Review):    ITP Comments:  ITP Comments     Row Name 06/17/23 0951 06/19/23 1541         ITP Comments Virtual Visit completed. Patient informed on EP and RD appointment and 6 Minute walk test. Patient also informed of patient health questionnaires on My Chart. Patient Verbalizes understanding. Visit diagnosis can be found in [facility] 03/12/2023. Completed and gym orientation. Initial ITP created and sent for review to Dr. Daniel Nones, Medical Director.               Comments: Initial ITP

## 2023-06-20 ENCOUNTER — Other Ambulatory Visit: Payer: Self-pay

## 2023-06-20 ENCOUNTER — Other Ambulatory Visit (HOSPITAL_COMMUNITY): Payer: Self-pay

## 2023-06-20 DIAGNOSIS — R0789 Other chest pain: Secondary | ICD-10-CM | POA: Diagnosis not present

## 2023-06-20 DIAGNOSIS — I10 Essential (primary) hypertension: Secondary | ICD-10-CM | POA: Diagnosis not present

## 2023-06-20 DIAGNOSIS — E039 Hypothyroidism, unspecified: Secondary | ICD-10-CM | POA: Diagnosis not present

## 2023-06-20 DIAGNOSIS — E279 Disorder of adrenal gland, unspecified: Secondary | ICD-10-CM | POA: Diagnosis not present

## 2023-06-20 DIAGNOSIS — G4700 Insomnia, unspecified: Secondary | ICD-10-CM | POA: Diagnosis not present

## 2023-06-20 DIAGNOSIS — F419 Anxiety disorder, unspecified: Secondary | ICD-10-CM | POA: Diagnosis not present

## 2023-06-20 DIAGNOSIS — J449 Chronic obstructive pulmonary disease, unspecified: Secondary | ICD-10-CM | POA: Diagnosis not present

## 2023-06-20 DIAGNOSIS — F32A Depression, unspecified: Secondary | ICD-10-CM | POA: Diagnosis not present

## 2023-06-20 MED ORDER — TRAZODONE HCL 50 MG PO TABS
50.0000 mg | ORAL_TABLET | Freq: Every day | ORAL | 3 refills | Status: AC
  Filled 2023-06-20: qty 90, 90d supply, fill #0
  Filled 2023-07-29 – 2023-09-14 (×2): qty 90, 90d supply, fill #1
  Filled 2023-12-13: qty 90, 90d supply, fill #2
  Filled 2024-04-19: qty 90, 90d supply, fill #3

## 2023-06-20 MED ORDER — BUSPIRONE HCL 5 MG PO TABS
5.0000 mg | ORAL_TABLET | Freq: Two times a day (BID) | ORAL | 11 refills | Status: DC
Start: 2023-06-20 — End: 2023-10-29
  Filled 2023-06-20: qty 60, 30d supply, fill #0

## 2023-06-23 NOTE — Progress Notes (Unsigned)
 Cardiology Office Note    Date:  06/26/2023   ID:  Kaylee Pope, Kaylee Pope 12-06-1955, MRN [id number]  PCP:  Jerl Mina, MD  Cardiologist:  Yvonne Kendall, MD  Electrophysiologist:  None   Chief Complaint: Follow-up  History of Present Illness:   Kaylee Pope is a 68 y.o. female with history of CAD status post four-vessel CABG on 07/27/2022 with LIMA to LAD, SVG to diagonal, SVG to OM, and SVG to PDA, thoracoabdominal aortic aneurysm status post repair at [facility] 11/2022 with endoleak s/p repair in 12/2022 with significant complication as outlined below, HTN, HLD, and hypothyroidism who presents for follow-up of CAD and HTN.   She has a history of enlarging thoracoabdominal aortic aneurysm measuring 5.7 cm and was scheduled for endovascular repair at [facility]. In this setting, she underwent preoperative cardiac risk stratification due to exertional substernal and left-sided chest discomfort. Echo performed at [facility] on 07/10/2022 showed an EF greater than 55%, normal RV systolic function and ventricular cavity size, and mild aortic insufficiency. Carotid artery ultrasound at that time showed less than 50% bilateral ICA stenoses. She underwent coronary CTA on 07/12/2022 which demonstrated a calcium score of 1361 which was the 99th percentile. There was significant dilatation of the descending thoracic aorta measuring 56 mm in diameter with severe mural atheroma. The RCA was dominant with a CTO of the proximal to mid segment, 50 to 69% proximal LAD stenosis, and 25 to 49% proximal nondominant LCx stenosis. CT FFR was significant in the mid LAD and LCx. LHC on 07/23/2022 showed severe three-vessel CAD including sequential 70 to 90% proximal and mid LAD stenoses with aneurysmal segment at the takeoff of a large D1 branch, 70 to 80% proximal/mid LCx stenosis and CTO of the mid RCA with left to right collaterals. Mildly elevated LVEDP estimated at 22 mmHg. She was subsequently evaluated by CVTS and underwent  four-vessel CABG on 07/27/2022. Post cath course was largely uncomplicated with expected postoperative volume overload and blood loss anemia. She was diuresed. She did have hypoxia with oxygen saturations in the mid 80s with ambulation and was discharged on supplemental oxygen.  She underwent TEVAR at [facility] on 11/12/2022.  She was seen in our office in 11/2022 for evaluation of recurrent chest pain following endovascular thoracoabdominal aortic aneurysm repair earlier in the month.  Vascular surgery felt her symptoms were not related to her recent vascular repair with known endoleak.  Subsequent MPI was abnormal with a small reversible basal and mid inferior defect.  She subsequently underwent echo that showed low normal LVEF.  Cardiac cath demonstrated severe native vessel CAD with patent bypass grafts.  SVG to acute marginal at 60 to 70% stenosis, though this was not intervened upon given it did not appear severe and was supplying a small, diffusely diseased branch.  Imdur was added, though this led to headaches.  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  LCIA and LEIA were stented along with left CFA/SFA endarterectomy with patch angioplasty, profundaplasty, and SFA embolectomy.  She was seen in the office on 01/21/2023 and noted ongoing pain in the pelvis that typically radiated from the left lower pelvis around to her back.  Previous upper back  and chest pain were resolved.  She noted fatigue as well.  Post procedure, [facility] decreased her carvedilol to allow for some permissive hypertension and to minimize the risk for spinal cord hypoperfusion and injury.  She was on monotherapy with  aspirin 81 mg (clopidogrel was deferred with mild thrombocytopenia).  BP at our office visit was elevated at 172/102, though she reported better readings at home typically in the 140s over 80s.  It was recommended to carvedilol be escalated for management of hypertension as soon as vascular surgery felt it was not safe to do so.  She subsequently notified our office of episodes of epistaxis on clopidogrel with recommendation to discuss this with [facility] vascular surgery who subsequently discontinued clopidogrel.  Followed up with [facility] vascular surgery on 02/01/2023 with improved blood pressure of 138/79.  Home BP readings were in the 140s over 80s.   She was seen in the office on 03/12/2023 and had increased carvedilol back to 12.5 mg twice daily following her [facility] appointment.  She continued to note left flank and left pelvic/groin discomfort following her vascular procedure.  Imaging in 03/2023 showed normal ABI bilaterally with patent left lower extremity stent.  She was most recently seen in the office on 04/23/2023 and was doing well from a cardiac perspective.  She did continue to note some discomfort that she wondered if was related to her sternotomy that was improving.  She was no longer requiring supplemental oxygen.  Amlodipine was titrated to 10 mg daily with continuation of carvedilol 12.5 mg twice daily.  She followed up with vascular surgery at [facility] on 04/25/2023 with imaging at that time showing no stenosis in the bilateral renal arteries and all stents were patent.   She comes in doing well from a cardiac perspective and is without symptoms of angina or cardiac decompensation.  She did note an increase in dizziness following titration of amlodipine to 10 mg and has since reduced this back down to 5 mg with improvement in dizziness.  No presyncope or syncope.  No falls or symptoms concerning for bleeding.  No significant lower extremity swelling or progressive orthopnea.  She is dealing with some  underlying anxiety regarding her comorbidities and is currently working with her PCP regarding this, recently started BuSpar.  Otherwise, she does not have any acute cardiac concerns at this time and feels like she is doing well.   Labs independently reviewed: 06/2023 - TSH normal, potassium 3.8, BUN 23, serum creatinine 1.1, albumin 4.4, AST/ALT normal 01/2023 - Hgb 10.6, PLT 144, magnesium 2.0 09/2022 - TC 145, TG 108, HDL 53, LDL 70 07/2022 - A1c 5.9, LP(a) 307.5  Past Medical History:  Diagnosis Date   Allergy    CAD (coronary artery disease)    a. s/p 4-V CABG (LIMA-LAD, VG-diag, VG-OM, VG-PDA) 07/2022   Hyperlipidemia    Hypertension    Thoracoabdominal aortic aneurysm (HCC)    Thyroid disease     Past Surgical History:  Procedure Laterality Date   ABDOMINAL AORTIC ANEURYSM REPAIR     CORONARY ARTERY BYPASS GRAFT N/A 07/27/2022   Procedure: CORONARY ARTERY BYPASS GRAFTING (CABG) X4 USING LEFT INTERNAL MAMMARY ARTERY AND BILATERAL ENDOSCOPIC HARVESTED GREATER SAPHENOUS VEINS;  Surgeon: Alleen Borne, MD;  Location: [facility] OR;  Service: Open Heart Surgery;  Laterality: N/A;   LEFT HEART CATH AND CORONARY ANGIOGRAPHY N/A 07/23/2022   Procedure: LEFT HEART CATH AND CORONARY ANGIOGRAPHY;  Surgeon: Yvonne Kendall, MD;  Location: [facility] INVASIVE CV LAB;  Service: Cardiovascular;  Laterality: N/A;   LEFT HEART CATH AND CORS/GRAFTS ANGIOGRAPHY N/A 12/17/2022   Procedure: LEFT HEART CATH AND CORS/GRAFTS ANGIOGRAPHY;  Surgeon: Yvonne Kendall, MD;  Location: [facility] INVASIVE CV LAB;  Service: Cardiovascular;  Laterality: N/A;   TEE WITHOUT CARDIOVERSION N/A 07/27/2022   Procedure: TRANSESOPHAGEAL ECHOCARDIOGRAM;  Surgeon: Alleen Borne, MD;  Location: [facility] OR;  Service: Open Heart Surgery;  Laterality: N/A;   TONSILLECTOMY     age 36    Current Medications: Current Meds  Medication Sig   acetaminophen (TYLENOL) 500 MG tablet Take 1,000 mg by mouth every 6 (six) hours as needed for moderate pain.    ALPRAZolam (XANAX) 0.5 MG tablet Take 0.5 mg by mouth daily as needed. PRN   amLODipine (NORVASC) 5 MG tablet Take 1 tablet (5 mg total) by mouth daily.   aspirin EC 81 MG tablet Take 81 mg by mouth daily. Swallow whole.   busPIRone (BUSPAR) 5 MG tablet Take 1 tablet (5 mg total) by mouth 2 (two) times daily   carvedilol (COREG) 12.5 MG tablet Take 1 tablet (12.5 mg total) by mouth 2 (two) times daily.   furosemide (LASIX) 20 MG tablet Take 1 tablet (20 mg total) by mouth daily as needed (shortness of breath). (Patient taking differently: Take 20 mg by mouth daily.)   levothyroxine (SYNTHROID) 75 MCG tablet Take 1 tablet (75 mcg total) by mouth on empty stomach with a glass of water at least 30-60 minutes before breakfast.   rosuvastatin (CRESTOR) 20 MG tablet Take 1 tablet (20 mg total) by mouth daily.   traZODone (DESYREL) 50 MG tablet Take 1 tablet (50 mg total) by mouth at bedtime   venlafaxine XR (EFFEXOR-XR) 150 MG 24 hr capsule Take 1 capsule (150 mg total) by mouth daily.    Allergies:   Patient has no known allergies.   Social History   Socioeconomic History   Marital status: Married    Spouse name: Not on file   Number of children: 2   Years of education: Not on file   Highest education level: Not on file  Occupational History   Not on file  Tobacco Use   Smoking status: Former    Current packs/day: 0.25    Average packs/day: 0.3 packs/day for 50.0 years (12.5 ttl pk-yrs)    Types: Cigarettes    Passive exposure: Current   Smokeless tobacco: Never   Tobacco comments:    Stopped smoking 4/46/2024  Vaping Use   Vaping status: Never Used  Substance and Sexual Activity   Alcohol use: Not Currently   Drug use: Never   Sexual activity: Not on file  Other Topics Concern   Not on file  Social History Narrative   Engineer, materials at [employer]   One grandson   Social Drivers of Health   Financial Resource Strain: Low Risk  (11/13/2022)   Received [facility]   Overall Financial Resource Strain (CARDIA)    Difficulty of Paying Living Expenses: Not very hard  Food Insecurity: No Food Insecurity (11/13/2022)   Received from [facility]   Hunger Vital Sign    Worried About Running Out of Food in the Last Year: Never true    Ran Out of Food in the Last Year: Never true  Transportation Needs: No Transportation Needs (10/10/2022)   Received from [facility]   PRAPARE - Transportation    In the past 12 months, has lack of  transportation kept you from medical appointments or from getting medications?: No    Lack of Transportation (Non-Medical): No  Physical Activity: Not on file  Stress: Not on file  Social Connections: Not on file     Family History:  The patient's family history includes Breast cancer (age of onset: 53) in her maternal aunt; COPD in her mother; Cancer in her maternal aunt; Diabetes in her sister; Heart Problems in her maternal grandmother; Heart attack in her father; Stroke in her father.  ROS:   12-point review of systems is negative unless otherwise noted in the HPI.   EKGs/Labs/Other Studies Reviewed:    Studies reviewed were summarized above. The additional studies were reviewed today:  [facility] 12/17/2022: Conclusions: Severe native three-vessel coronary artery disease, as detailed below, including total occlusions of the mid LAD, mid LCx, and mid RCA.  Distal RCA branches fill via left-to-right collaterals. Widely patent LIMA-LAD. Patent SVG-D with minimal luminal irregularities. Patent SVG-OM3 with graft ectasia and minimal luminal irregularities. Patent SVG-AM with long 60-70% stenosis in the proximal graft and supplying a small and diffusely disease branch. Mildly elevated left ventricular filling pressure (LVEDP 20 mmHg).   Recommendations: No culprit lesion identified for recent chest pain and dyspnea that began after TAAA repair with known endoleak.  Will  add isosorbide mononitrate 15 mg daily and furosemide 20 mg daily for medical therapy, including possible component of diastolic heart failure.  SVG-AM is not ideal for PCI, given long segment of moderate to severe disease and small, diffusely disease distal vessel. Continue aggressive secondary prevention of ASCVD. Proceed with staged TAAA repair per [facility] vascular surgery (I have contacted them regarding the findings of today's cath and my recommendations. ___________   Limited echo 12/14/2022: 1. Left ventricular ejection fraction, by estimation, is 50 to 55%. The  left ventricle has low normal function. The average left ventricular  global longitudinal strain is -11.7 %. The global longitudinal strain is  abnormal.   2. Right ventricular systolic function is normal. The right ventricular  size is normal.   3. The mitral valve is normal in structure. Mild mitral valve  regurgitation.   4. The aortic valve is tricuspid. Aortic valve regurgitation is mild.  Aortic valve sclerosis is present, with no evidence of aortic valve  stenosis.   5. The inferior vena cava is normal in size with greater than 50%  respiratory variability, suggesting right atrial pressure of 3 mmHg.  __________   [facility] MPI 12/07/2022:   Abnormal pharmacologic myocardial perfusion stress test.   There is a small in size, moderate in severity, partially reversible defect involving the basal and mid inferior segments consistent with scar and periinfarct ischemia.   Left ventricular systolic function is normal (LVEF 60-65%)   Post CABG findings are present as well as thoracoabdominal aortic aneurysm with endovascular graft in the descending aorta.   Atelectasis versus scarring noted in the left upper lobe.   The study is intermediate risk. __________   2D echo 07/23/2022: 1. Left ventricular ejection fraction, by estimation, is 55 to 60%. The  left ventricle has normal function. The left ventricle has no regional  wall  motion abnormalities. There is mild concentric left ventricular  hypertrophy. Left ventricular diastolic  parameters are indeterminate.   2. Right ventricular systolic function is normal. The right ventricular  size is normal.   3. The mitral valve is normal in structure. Trivial mitral valve  regurgitation. No evidence of mitral stenosis.   4. The aortic  valve is normal in structure. Aortic valve regurgitation is  not visualized. No aortic stenosis is present.   5. Abodminal aorta is dilated ( 3.3cm).   6. The inferior vena cava is normal in size with greater than 50%  respiratory variability, suggesting right atrial pressure of 3 mmHg.   7. Evidence of atrial level shunting detected by color flow Doppler.  Small secundum ASD noted 1.20 cm.  __________   [facility] 07/23/2022: Conclusions: Severe three-vessel coronary artery disease including sequential 70-90% proximal and mid LAD stenoses with aneurysmal segment at the takeoff of large first diagonal branch, 70-80% proximal/mid LCx stenosis, and chronic total occlusion of mid RCA with left-to-right collaterals. Mildly elevated left ventricular filling pressure (LVEDP 22 mmHg).  LVEF known to be normal based on recent echocardiogram at [facility].   Recommendations: Admit for antianginal therapy, as progressive atypical left-sided chest pain is most likely the patient's anginal equivalent, as well as cardiac surgery consultation for CABG. Aggressive secondary prevention of coronary artery disease. __________   Coronary CTA with CT FFR 07/12/2022: IMPRESSION: 1. Descending thoracic aortic aneurysm this should be followed up with CTA of the chest for complete evaluation. 2. Otherwise no significant extracardiac incidental findings were seen.   FINDINGS: Aorta: Severe dilation of descending thoracic aorta, 56 mm in diameter, with severe mural atheroma. No dissection.   Aortic Valve:  Trileaflet. mild calcifications.   Coronary Arteries:  Normal  coronary origin.  Right dominance.   RCA is a dominant artery. There is calcified and non calcified plaque proximally causing total occlusion in the proximal-mid segment (100%).   Left main gives rise to LAD and LCX arteries. LM has no disease.   LAD has calcified plaque proximally causing moderate stenosis (50-69%).   LCX is a non-dominant artery. There is calcified plaque in the proximal segment causing mild stenosis (25-49%).   Other findings:   Normal pulmonary vein drainage into the left atrium.   Normal left atrial appendage without a thrombus.   Normal size of the pulmonary artery.   IMPRESSION: 1. Coronary calcium score of 1361. This was 99th percentile for age and sex matched control. 2. Normal coronary origin with right dominance. 3. CTO of proximal to mid RCA. 4. Moderate proximal LAD stenosis (50-69%). 5. Mild proximal LCx stenosis (25-49%). 6. Known severe dilation of descending thoracic aorta, 56 mm in diameter, with severe mural atheroma. 7. CAD-RADS 5 Total coronary occlusion (100%). Consider cardiac catheterization or viability assessment. Consider symptom-guided anti-ischemic pharmacotherapy as well as risk factor modification per guideline directed care. 8. Will submit CT FFR to analyze proximal LAD. Will be reported separately.   1. Left Main:  No significant stenosis.   2. LAD: significant stenosis in the mid LAD.  FFRct 0.71 3. LCX: significant stenosis in the mid LCx.  FFRct 0.77 4. RCA: Total occlusion of mid RCA.   IMPRESSION: 1. CT FFR analysis showed significant stenosis in the mid LAD and LCx. 2.  Total occlusion of mid RCA. 3.  Cardiac catheterization recommended.   EKG:  EKG is not ordered today.    Recent Labs: 07/30/2022: Magnesium 2.3 11/23/2022: ALT 12 12/14/2022: BUN 12; Creatinine, Ser 0.85; Hemoglobin 10.0; Platelets 257; Potassium 3.8; Sodium 139  Recent Lipid Panel    Component Value Date/Time   CHOL 130 09/21/2022 1551    TRIG 111 09/21/2022 1551   HDL 52 09/21/2022 1551   CHOLHDL 2.5 09/21/2022 1551   VLDL 22 09/21/2022 1551   LDLCALC 56 09/21/2022 1551  LDLDIRECT 64 09/21/2022 1551    PHYSICAL EXAM:    VS:  BP 118/62 (BP Location: Left Arm, Patient Position: Sitting, Cuff Size: Normal)   Pulse 66   Ht 5' (1.524 m)   Wt 157 lb 9.6 oz (71.5 kg)   SpO2 98%   BMI 30.78 kg/m   BMI: Body mass index is 30.78 kg/m.  Physical Exam Vitals reviewed.  Constitutional:      Appearance: She is well-developed.  HENT:     Head: Normocephalic and atraumatic.  Eyes:     General:        Right eye: No discharge.        Left eye: No discharge.  Cardiovascular:     Rate and Rhythm: Normal rate and regular rhythm.     Heart sounds: Normal heart sounds, S1 normal and S2 normal. Heart sounds not distant. No midsystolic click and no opening snap. No murmur heard.    No friction rub.  Pulmonary:     Effort: Pulmonary effort is normal. No respiratory distress.     Breath sounds: Normal breath sounds. No decreased breath sounds, wheezing, rhonchi or rales.  Chest:     Chest wall: No tenderness.  Musculoskeletal:     Cervical back: Normal range of motion.  Skin:    General: Skin is warm and dry.     Nails: There is no clubbing.  Neurological:     Mental Status: She is alert and oriented to person, place, and time.  Psychiatric:        Speech: Speech normal.        Behavior: Behavior normal.        Thought Content: Thought content normal.        Judgment: Judgment normal.     Wt Readings from Last 3 Encounters:  06/26/23 157 lb 9.6 oz (71.5 kg)  06/19/23 156 lb 1.6 oz (70.8 kg)  04/23/23 149 lb 9.6 oz (67.9 kg)     ASSESSMENT & PLAN:   CAD status post CABG without angina: She is doing well and without symptoms concerning for angina or cardiac decompensation.  Continue aggressive risk factor modification and secondary prevention including aspirin 81 mg twice daily, and rosuvastatin 20 mg.  No indication  for further ischemic testing at this time.  HTN: Blood pressure is well-controlled in the office today at 118/62.  She remains on amlodipine 5 mg and carvedilol 12.5 mg twice daily.  HLD: LDL 70 in 09/2022 with normal AST/ALT in 06/2023.  She remains on rosuvastatin 20 mg.  Thoracoabdominal aortic aneurysm status post endovascular repair complicated by left iliac artery rupture and thrombosis of the left CFA/SFA: Followed by vascular surgery at [facility].  Most recent noninvasive imaging showed no stenosis in the bilateral renal arteries and all stents were patent.       Disposition: F/u with Dr. Okey Dupre or an APP in 6 months.   Medication Adjustments/Labs and Tests Ordered: Current medicines are reviewed at length with the patient today.  Concerns regarding medicines are outlined above. Medication changes, Labs and Tests ordered today are summarized above and listed in the Patient Instructions accessible in Encounters.   Signed, Eula Listen, PA-C 06/26/2023 12:26 PM     [facility] [place] [address] [place], [place] [address] [phone]

## 2023-06-25 ENCOUNTER — Other Ambulatory Visit: Payer: Self-pay | Admitting: Emergency Medicine

## 2023-06-25 DIAGNOSIS — R0902 Hypoxemia: Secondary | ICD-10-CM

## 2023-06-25 NOTE — Progress Notes (Signed)
 Order for ONO sent to [facility] today.

## 2023-06-26 ENCOUNTER — Other Ambulatory Visit: Payer: Self-pay | Admitting: Family Medicine

## 2023-06-26 ENCOUNTER — Encounter: Payer: Self-pay | Admitting: Physician Assistant

## 2023-06-26 ENCOUNTER — Ambulatory Visit: Payer: PPO | Attending: Physician Assistant | Admitting: Physician Assistant

## 2023-06-26 VITALS — BP 118/62 | HR 66 | Ht 60.0 in | Wt 157.6 lb

## 2023-06-26 DIAGNOSIS — Z951 Presence of aortocoronary bypass graft: Secondary | ICD-10-CM

## 2023-06-26 DIAGNOSIS — I739 Peripheral vascular disease, unspecified: Secondary | ICD-10-CM

## 2023-06-26 DIAGNOSIS — I2510 Atherosclerotic heart disease of native coronary artery without angina pectoris: Secondary | ICD-10-CM | POA: Diagnosis not present

## 2023-06-26 DIAGNOSIS — I10 Essential (primary) hypertension: Secondary | ICD-10-CM

## 2023-06-26 DIAGNOSIS — E785 Hyperlipidemia, unspecified: Secondary | ICD-10-CM | POA: Diagnosis not present

## 2023-06-26 DIAGNOSIS — I716 Thoracoabdominal aortic aneurysm, without rupture, unspecified: Secondary | ICD-10-CM | POA: Diagnosis not present

## 2023-06-26 DIAGNOSIS — E279 Disorder of adrenal gland, unspecified: Secondary | ICD-10-CM

## 2023-06-26 NOTE — Patient Instructions (Signed)
 Medication Instructions:  Your Physician recommend you continue on your current medication as directed.    *If you need a refill on your cardiac medications before your next appointment, please call your pharmacy*   Lab Work: None ordered at this time   Follow-Up: At [facility], you and your health needs are our priority.  As part of our continuing mission to provide you with exceptional heart care, we have created designated Provider Care Teams.  These Care Teams include your primary Cardiologist (physician) and Advanced Practice Providers (APPs -  Physician Assistants and Nurse Practitioners) who all work together to provide you with the care you need, when you need it.    Your next appointment:   6 month(s)  Provider:   You may see Yvonne Kendall, MD or Eula Listen, PA-C

## 2023-07-01 ENCOUNTER — Encounter: Admitting: *Deleted

## 2023-07-01 DIAGNOSIS — Z951 Presence of aortocoronary bypass graft: Secondary | ICD-10-CM

## 2023-07-01 DIAGNOSIS — Z48812 Encounter for surgical aftercare following surgery on the circulatory system: Secondary | ICD-10-CM | POA: Diagnosis not present

## 2023-07-01 NOTE — Progress Notes (Signed)
 Daily Session Note  Patient Details  Name: Kaylee Pope MRN: [id number] Date of Birth: Jan 26, 1956 Referring Provider:   Flowsheet Row Cardiac Rehab from 06/19/2023 in [facility] Cardiac and Pulmonary Rehab  Referring Provider End, Cristal Deer, MD       Encounter Date: 07/01/2023  Check In:  Session Check In - 07/01/23 1626       Check-In   Supervising physician immediately available to respond to emergencies See telemetry face sheet for immediately available ER MD    Location [facility]-Cardiac & Pulmonary Rehab    Staff Present Rory Percy, MS, Exercise Physiologist;Maxon Suzzette Righter, Exercise Physiologist;Kelly Cloretta Ned, ACSM CEP, Exercise Physiologist;Tecia Cinnamon, RN, BSN, CCRP;Meredith Craven RN,BSN    Virtual Visit No    Medication changes reported     No    Fall or balance concerns reported    No    Warm-up and Cool-down Performed on first and last piece of equipment    Resistance Training Performed Yes    VAD Patient? No    PAD/SET Patient? No      Pain Assessment   Currently in Pain? No/denies                Social History   Tobacco Use  Smoking Status Former   Current packs/day: 0.25   Average packs/day: 0.3 packs/day for 50.0 years (12.5 ttl pk-yrs)   Types: Cigarettes   Passive exposure: Current  Smokeless Tobacco Never  Tobacco Comments   Stopped smoking 4/46/2024    Goals Met:  Exercise tolerated well Personal goals reviewed No report of concerns or symptoms today  Goals Unmet:  Not Applicable  Comments: Pt able to follow exercise prescription today without complaint.  Will continue to monitor for progression. First full day of exercise!  Patient was oriented to gym and equipment including functions, settings, policies, and procedures.  Patient's individual exercise prescription and treatment plan were reviewed.  All starting workloads were established based on the results of the 6 minute walk test done at initial orientation visit.  The plan for  exercise progression was also introduced and progression will be customized based on patient's performance and goals.    Dr. Bethann Punches is Medical Director for [facility] Cardiac Rehabilitation.  Dr. Vida Rigger is Medical Director for [facility] Pulmonary Rehabilitation.

## 2023-07-03 ENCOUNTER — Encounter: Admitting: *Deleted

## 2023-07-03 DIAGNOSIS — Z48812 Encounter for surgical aftercare following surgery on the circulatory system: Secondary | ICD-10-CM | POA: Diagnosis not present

## 2023-07-03 DIAGNOSIS — Z951 Presence of aortocoronary bypass graft: Secondary | ICD-10-CM

## 2023-07-03 NOTE — Progress Notes (Signed)
 Daily Session Note  Patient Details  Name: Kaylee Pope MRN: [id number] Date of Birth: 21-Sep-1955 Referring Provider:   Flowsheet Row Cardiac Rehab from 06/19/2023 in [facility] Cardiac and Pulmonary Rehab  Referring Provider End, Cristal Deer, MD       Encounter Date: 07/03/2023  Check In:  Session Check In - 07/03/23 1114       Check-In   Supervising physician immediately available to respond to emergencies See telemetry face sheet for immediately available ER MD    Location [facility]-Cardiac & Pulmonary Rehab    Staff Present Cora Collum, RN, BSN, CCRP;Joseph Hood RCP,RRT,BSRT;Noah Tickle, BS, Exercise Physiologist;Maxon Conetta BS, Exercise Physiologist;Jason Wallace Cullens RDN,LDN    Virtual Visit No    Medication changes reported     No    Fall or balance concerns reported    No    Warm-up and Cool-down Performed on first and last piece of equipment    Resistance Training Performed Yes    VAD Patient? No    PAD/SET Patient? No      Pain Assessment   Currently in Pain? No/denies                Social History   Tobacco Use  Smoking Status Former   Current packs/day: 0.25   Average packs/day: 0.3 packs/day for 50.0 years (12.5 ttl pk-yrs)   Types: Cigarettes   Passive exposure: Current  Smokeless Tobacco Never  Tobacco Comments   Stopped smoking 4/46/2024    Goals Met:  Independence with exercise equipment Exercise tolerated well No report of concerns or symptoms today  Goals Unmet:  Not Applicable  Comments: Pt able to follow exercise prescription today without complaint.  Will continue to monitor for progression.    Dr. Bethann Punches is Medical Director for [facility] Cardiac Rehabilitation.  Dr. Vida Rigger is Medical Director for [facility] Pulmonary Rehabilitation.

## 2023-07-05 ENCOUNTER — Encounter

## 2023-07-05 DIAGNOSIS — Z951 Presence of aortocoronary bypass graft: Secondary | ICD-10-CM

## 2023-07-05 DIAGNOSIS — Z48812 Encounter for surgical aftercare following surgery on the circulatory system: Secondary | ICD-10-CM | POA: Diagnosis not present

## 2023-07-05 NOTE — Progress Notes (Signed)
 Daily Session Note  Patient Details  Name: Kaylee Pope MRN: [id number] Date of Birth: [birth year] Referring Provider:   Flowsheet Row Cardiac Rehab from 06/19/2023 in [facility] Cardiac and Pulmonary Rehab  Referring Provider End, Cristal Deer, MD       Encounter Date: 07/05/2023  Check In:  Session Check In - 07/05/23 1137       Check-In   Supervising physician immediately available to respond to emergencies See telemetry face sheet for immediately available ER MD    Location [facility]-Cardiac & Pulmonary Rehab    Staff Present Cora Collum, RN, BSN, CCRP;Joseph Hood RCP,RRT,BSRT;Noah Tickle, [place], Exercise Physiologist    Virtual Visit No    Medication changes reported     No    Fall or balance concerns reported    No    Warm-up and Cool-down Performed on first and last piece of equipment    Resistance Training Performed Yes    VAD Patient? No    PAD/SET Patient? No      Pain Assessment   Currently in Pain? No/denies                Social History   Tobacco Use  Smoking Status Former   Current packs/day: 0.25   Average packs/day: 0.3 packs/day for 50.0 years (12.5 ttl pk-yrs)   Types: Cigarettes   Passive exposure: Current  Smokeless Tobacco Never  Tobacco Comments   Stopped smoking 4/46/2024    Goals Met:  Independence with exercise equipment Exercise tolerated well No report of concerns or symptoms today  Goals Unmet:  Not Applicable  Comments: Pt able to follow exercise prescription today without complaint.  Will continue to monitor for progression.    Dr. Bethann Punches is Medical Director for [facility] Cardiac Rehabilitation.  Dr. Vida Rigger is Medical Director for [facility] Pulmonary Rehabilitation.

## 2023-07-08 ENCOUNTER — Encounter: Admitting: *Deleted

## 2023-07-08 ENCOUNTER — Other Ambulatory Visit (HOSPITAL_COMMUNITY): Payer: Self-pay

## 2023-07-08 DIAGNOSIS — Z48812 Encounter for surgical aftercare following surgery on the circulatory system: Secondary | ICD-10-CM | POA: Diagnosis not present

## 2023-07-08 DIAGNOSIS — Z951 Presence of aortocoronary bypass graft: Secondary | ICD-10-CM

## 2023-07-08 MED ORDER — BUSPIRONE HCL 7.5 MG PO TABS
7.5000 mg | ORAL_TABLET | Freq: Two times a day (BID) | ORAL | 0 refills | Status: DC | PRN
  Filled 2023-07-08: qty 60, 30d supply, fill #0

## 2023-07-08 NOTE — Progress Notes (Signed)
 Daily Session Note  Patient Details  Name: Kaylee Pope MRN: [id number] Date of Birth: 1955/04/20 Referring Provider:   Flowsheet Row Cardiac Rehab from 06/19/2023 in [facility] Cardiac and Pulmonary Rehab  Referring Provider End, Cristal Deer, MD       Encounter Date: 07/08/2023  Check In:  Session Check In - 07/08/23 1136       Check-In   Supervising physician immediately available to respond to emergencies See telemetry face sheet for immediately available ER MD    Location [facility]-Cardiac & Pulmonary Rehab    Staff Present Cora Collum, RN, BSN, CCRP;Margaret Best, MS, Exercise Physiologist;Kelly Madilyn Fireman BS, ACSM CEP, Exercise Physiologist;Maxon Conetta BS, Exercise Physiologist    Virtual Visit No    Medication changes reported     No    Fall or balance concerns reported    No    Warm-up and Cool-down Performed on first and last piece of equipment    Resistance Training Performed Yes    VAD Patient? No    PAD/SET Patient? No      Pain Assessment   Currently in Pain? No/denies                Social History   Tobacco Use  Smoking Status Former   Current packs/day: 0.25   Average packs/day: 0.3 packs/day for 50.0 years (12.5 ttl pk-yrs)   Types: Cigarettes   Passive exposure: Current  Smokeless Tobacco Never  Tobacco Comments   Stopped smoking 4/46/2024    Goals Met:  Independence with exercise equipment Exercise tolerated well No report of concerns or symptoms today  Goals Unmet:  Not Applicable  Comments: Pt able to follow exercise prescription today without complaint.  Will continue to monitor for progression.    Dr. Bethann Punches is Medical Director for [facility] Cardiac Rehabilitation.  Dr. Vida Rigger is Medical Director for [facility] Pulmonary Rehabilitation.

## 2023-07-10 ENCOUNTER — Encounter: Attending: Internal Medicine | Admitting: *Deleted

## 2023-07-10 DIAGNOSIS — Z951 Presence of aortocoronary bypass graft: Secondary | ICD-10-CM | POA: Insufficient documentation

## 2023-07-10 NOTE — Progress Notes (Signed)
 Daily Session Note  Patient Details  Name: DAYAN KREIS MRN: [id number] Date of Birth: [birth year] Referring Provider:   Flowsheet Row Cardiac Rehab from 06/19/2023 in [facility] Cardiac and Pulmonary Rehab  Referring Provider End, Cristal Deer, MD       Encounter Date: 07/10/2023  Check In:  Session Check In - 07/10/23 1133       Check-In   Supervising physician immediately available to respond to emergencies See telemetry face sheet for immediately available ER MD    Location [facility]-Cardiac & Pulmonary Rehab    Staff Present Cora Collum, RN, BSN, CCRP;Joseph Hood RCP,RRT,BSRT;Maxon [place] BS, Exercise Physiologist;Noah Tickle, BS, Exercise Physiologist;Jason Wallace Cullens RDN,LDN    Virtual Visit No    Medication changes reported     No    Fall or balance concerns reported    No    Warm-up and Cool-down Performed on first and last piece of equipment    Resistance Training Performed Yes    VAD Patient? No    PAD/SET Patient? No      Pain Assessment   Currently in Pain? No/denies                Social History   Tobacco Use  Smoking Status Former   Current packs/day: 0.25   Average packs/day: 0.3 packs/day for 50.0 years (12.5 ttl pk-yrs)   Types: Cigarettes   Passive exposure: Current  Smokeless Tobacco Never  Tobacco Comments   Stopped smoking 4/46/2024    Goals Met:  Independence with exercise equipment Exercise tolerated well No report of concerns or symptoms today  Goals Unmet:  Not Applicable  Comments: Pt able to follow exercise prescription today without complaint.  Will continue to monitor for progression.    Dr. Bethann Punches is Medical Director for [facility] Cardiac Rehabilitation.  Dr. Vida Rigger is Medical Director for [facility] Pulmonary Rehabilitation.

## 2023-07-12 ENCOUNTER — Encounter: Admitting: *Deleted

## 2023-07-12 DIAGNOSIS — Z951 Presence of aortocoronary bypass graft: Secondary | ICD-10-CM

## 2023-07-12 NOTE — Progress Notes (Signed)
 Daily Session Note  Patient Details  Name: Kaylee Pope MRN: [id number] Date of Birth: 1955/11/01 Referring Provider:   Flowsheet Row Cardiac Rehab from 06/19/2023 in [facility] Cardiac and Pulmonary Rehab  Referring Provider End, Cristal Deer, MD       Encounter Date: 07/12/2023  Check In:  Session Check In - 07/12/23 1104       Check-In   Supervising physician immediately available to respond to emergencies See telemetry face sheet for immediately available ER MD    Location [facility]-Cardiac & Pulmonary Rehab    Staff Present Rory Percy, MS, Exercise Physiologist;Maxon Conetta BS, Exercise Physiologist;Noah Tickle, BS, Exercise Physiologist;Orlan Aversa, RN, BSN, CCRP    Virtual Visit No    Medication changes reported     No    Fall or balance concerns reported    No    Warm-up and Cool-down Performed on first and last piece of equipment    Resistance Training Performed Yes    VAD Patient? No    PAD/SET Patient? No      Pain Assessment   Currently in Pain? No/denies                Social History   Tobacco Use  Smoking Status Former   Current packs/day: 0.25   Average packs/day: 0.3 packs/day for 50.0 years (12.5 ttl pk-yrs)   Types: Cigarettes   Passive exposure: Current  Smokeless Tobacco Never  Tobacco Comments   Stopped smoking 4/46/2024    Goals Met:  Independence with exercise equipment Exercise tolerated well No report of concerns or symptoms today  Goals Unmet:  Not Applicable  Comments: Pt able to follow exercise prescription today without complaint.  Will continue to monitor for progression.    Dr. Bethann Punches is Medical Director for [facility] Cardiac Rehabilitation.  Dr. Vida Rigger is Medical Director for [facility] Pulmonary Rehabilitation.

## 2023-07-15 ENCOUNTER — Encounter: Admitting: *Deleted

## 2023-07-15 DIAGNOSIS — Z951 Presence of aortocoronary bypass graft: Secondary | ICD-10-CM | POA: Diagnosis not present

## 2023-07-15 NOTE — Progress Notes (Signed)
 Daily Session Note  Patient Details  Name: Kaylee Pope MRN: [id number] Date of Birth: 25-Feb-1956 Referring Provider:   Flowsheet Row Cardiac Rehab from 06/19/2023 in [facility] Cardiac and Pulmonary Rehab  Referring Provider End, Cristal Deer, MD       Encounter Date: 07/15/2023  Check In:  Session Check In - 07/15/23 1125       Check-In   Supervising physician immediately available to respond to emergencies See telemetry face sheet for immediately available ER MD    Location [facility]-Cardiac & Pulmonary Rehab    Staff Present Cora Collum, RN, BSN, CCRP;Joseph Hood RCP,RRT,BSRT;Maxon [employer], Exercise Physiologist;Kelly [place] BS, ACSM CEP, Exercise Physiologist    Virtual Visit No    Medication changes reported     Yes    Comments BUspar increased to 7.5mg  2 x a day    Fall or balance concerns reported    No    Warm-up and Cool-down Performed on first and last piece of equipment    Resistance Training Performed Yes    VAD Patient? No    PAD/SET Patient? No      Pain Assessment   Currently in Pain? No/denies                Social History   Tobacco Use  Smoking Status Former   Current packs/day: 0.25   Average packs/day: 0.3 packs/day for 50.0 years (12.5 ttl pk-yrs)   Types: Cigarettes   Passive exposure: Current  Smokeless Tobacco Never  Tobacco Comments   Stopped smoking 4/46/2024    Goals Met:  Independence with exercise equipment Exercise tolerated well No report of concerns or symptoms today  Goals Unmet:  Not Applicable  Comments: Pt able to follow exercise prescription today without complaint.  Will continue to monitor for progression.    Dr. Bethann Punches is Medical Director for [facility] Cardiac Rehabilitation.  Dr. Vida Rigger is Medical Director for [facility] Pulmonary Rehabilitation.

## 2023-07-17 ENCOUNTER — Encounter

## 2023-07-17 ENCOUNTER — Encounter: Payer: Self-pay | Admitting: *Deleted

## 2023-07-17 DIAGNOSIS — Z951 Presence of aortocoronary bypass graft: Secondary | ICD-10-CM

## 2023-07-17 NOTE — Progress Notes (Signed)
 Daily Session Note  Patient Details  Name: Kaylee Pope MRN: [id number] Date of Birth: [birth year] Referring Provider:   Flowsheet Row Cardiac Rehab from 06/19/2023 in [facility] Cardiac and Pulmonary Rehab  Referring Provider End, Cristal Deer, MD       Encounter Date: 07/17/2023  Check In:  Session Check In - 07/17/23 1059       Check-In   Supervising physician immediately available to respond to emergencies See telemetry face sheet for immediately available ER MD    Location [facility]-Cardiac & Pulmonary Rehab    Staff Present Susann Givens RN,BSN;Margaret Best, MS, Exercise Physiologist;Joseph Reino Kent RCP,RRT,BSRT;[employer]    Virtual Visit No    Medication changes reported     No    Fall or balance concerns reported    No    Warm-up and Cool-down Performed on first and last piece of equipment    Resistance Training Performed Yes    VAD Patient? No    PAD/SET Patient? No      Pain Assessment   Currently in Pain? No/denies                Social History   Tobacco Use  Smoking Status Former   Current packs/day: 0.25   Average packs/day: 0.3 packs/day for 50.0 years (12.5 ttl pk-yrs)   Types: Cigarettes   Passive exposure: Current  Smokeless Tobacco Never  Tobacco Comments   Stopped smoking 4/46/2024    Goals Met:  Independence with exercise equipment Exercise tolerated well No report of concerns or symptoms today Strength training completed today  Goals Unmet:  Not Applicable  Comments: Pt able to follow exercise prescription today without complaint.  Will continue to monitor for progression.    Dr. Bethann Punches is Medical Director for [facility] Cardiac Rehabilitation.  Dr. Vida Rigger is Medical Director for [facility] Pulmonary Rehabilitation.

## 2023-07-17 NOTE — Progress Notes (Signed)
 Cardiac Individual Treatment Plan  Patient Details  Name: Kaylee Pope MRN: [id number] Date of Birth: [birth year] Referring Provider:   Flowsheet Row Cardiac Rehab from 06/19/2023 in [facility] Cardiac and Pulmonary Rehab  Referring Provider End, Cristal Deer, MD       Initial Encounter Date:  Flowsheet Row Cardiac Rehab from 06/19/2023 in [facility] Cardiac and Pulmonary Rehab  Date 06/19/23       Visit Diagnosis: S/P CABG x 4  Patient's Home Medications on Admission:  Current Outpatient Medications:    acetaminophen (TYLENOL) 500 MG tablet, Take 1,000 mg by mouth every 6 (six) hours as needed for moderate pain., Disp: , Rfl:    ALPRAZolam (XANAX) 0.5 MG tablet, Take 0.5 mg by mouth daily as needed. PRN, Disp: , Rfl:    amLODipine (NORVASC) 10 MG tablet, Take 1 tablet (10 mg total) by mouth daily. (Patient taking differently: Take 10 mg by mouth daily. Patient states that she is taking 5 mg daily), Disp: 90 tablet, Rfl: 3   amLODipine (NORVASC) 5 MG tablet, Take 1 tablet (5 mg total) by mouth daily., Disp: 90 tablet, Rfl: 3   aspirin EC 81 MG tablet, Take 81 mg by mouth daily. Swallow whole., Disp: , Rfl:    busPIRone (BUSPAR) 5 MG tablet, Take 1 tablet (5 mg total) by mouth 2 (two) times daily (Patient not taking: Reported on 07/15/2023), Disp: 60 tablet, Rfl: 11   busPIRone (BUSPAR) 7.5 MG tablet, Take 1 tablet (7.5 mg total) by mouth 2 (two) times daily as needed., Disp: 60 tablet, Rfl: 0   carvedilol (COREG) 12.5 MG tablet, Take 1 tablet (12.5 mg total) by mouth 2 (two) times daily., Disp: 180 tablet, Rfl: 3   furosemide (LASIX) 20 MG tablet, Take 1 tablet (20 mg total) by mouth daily as needed (shortness of breath). (Patient taking differently: Take 20 mg by mouth daily.), Disp: 90 tablet, Rfl: 3   levothyroxine (SYNTHROID) 75 MCG tablet, Take 1 tablet (75 mcg total) by mouth on empty stomach with a glass of water at least 30-60 minutes before breakfast., Disp: 90 tablet, Rfl: 3    rosuvastatin (CRESTOR) 20 MG tablet, Take 1 tablet (20 mg total) by mouth daily., Disp: 90 tablet, Rfl: 3   traZODone (DESYREL) 50 MG tablet, Take 1 tablet (50 mg total) by mouth at bedtime, Disp: 90 tablet, Rfl: 3   venlafaxine XR (EFFEXOR-XR) 150 MG 24 hr capsule, Take 1 capsule (150 mg total) by mouth daily., Disp: 90 capsule, Rfl: 3  Past Medical History: Past Medical History:  Diagnosis Date   Allergy    CAD (coronary artery disease)    a. s/p 4-V CABG (LIMA-LAD, VG-diag, VG-OM, VG-PDA) 07/2022   Hyperlipidemia    Hypertension    Thoracoabdominal aortic aneurysm (HCC)    Thyroid disease     Tobacco Use: Social History   Tobacco Use  Smoking Status Former   Current packs/day: 0.25   Average packs/day: 0.3 packs/day for 50.0 years (12.5 ttl pk-yrs)   Types: Cigarettes   Passive exposure: Current  Smokeless Tobacco Never  Tobacco Comments   Stopped smoking 4/46/2024    Labs: Review Flowsheet  More data exists      Latest Ref Rng & Units 10/20/2014 07/26/2022 07/27/2022 07/28/2022 09/21/2022  Labs for ITP Cardiac and Pulmonary Rehab  Cholestrol 0 - 200 mg/dL 818  - - - 299   LDL (calc) 0 - 99 mg/dL - - - - 56   Direct LDL 0 - 99 mg/dL  161.0  - - - 64   HDL-C >40 mg/dL 40.98  - - - 52   Trlycerides <150 mg/dL 119.1  - - - 478   Hemoglobin A1c 4.8 - 5.6 % - - 5.9  - -  PH, Arterial 7.35 - 7.45 - 7.43  7.295  7.278  7.421  7.355  7.371  7.267  7.302  7.288  -  PCO2 arterial 32 - 48 mmHg - 47  54.6  53.2  38.5  43.1  48.3  52.1  50.6  56.6  -  Bicarbonate 20.0 - 28.0 mmol/L - 31.9  26.7  25.2  25.0  24.0  28.0  23.7  25.0  27.2  -  TCO2 22 - 32 mmol/L - - 28  27  28  26  27  28  25  31  31  29  25  27  29   -  Acid-base deficit 0.0 - 2.0 mmol/L - - 2.0  1.0  3.0  2.0  -  O2 Saturation % - 93.2  97  98  100  100  100  95  98  97  -    Details       Multiple values from one day are sorted in reverse-chronological order          Exercise Target Goals: Exercise Program  Goal: Individual exercise prescription set using results from initial 6 min walk test and THRR while considering  patient's activity barriers and safety.   Exercise Prescription Goal: Initial exercise prescription builds to 30-45 minutes a day of aerobic activity, 2-3 days per week.  Home exercise guidelines will be given to patient during program as part of exercise prescription that the participant will acknowledge.   Education: Aerobic Exercise: - Group verbal and visual presentation on the components of exercise prescription. Introduces F.I.T.T principle from ACSM for exercise prescriptions.  Reviews F.I.T.T. principles of aerobic exercise including progression. Written material given at graduation. Flowsheet Row Cardiac Rehab from 07/03/2023 in [facility] Cardiac and Pulmonary Rehab  Education need identified 06/19/23       Education: Resistance Exercise: - Group verbal and visual presentation on the components of exercise prescription. Introduces F.I.T.T principle from ACSM for exercise prescriptions  Reviews F.I.T.T. principles of resistance exercise including progression. Written material given at graduation.    Education: Exercise & Equipment Safety: - Individual verbal instruction and demonstration of equipment use and safety with use of the equipment. Flowsheet Row Cardiac Rehab from 07/03/2023 in [facility] Cardiac and Pulmonary Rehab  Date 06/19/23  Educator MB  Instruction Review Code 1- Verbalizes Understanding       Education: Exercise Physiology & General Exercise Guidelines: - Group verbal and written instruction with models to review the exercise physiology of the cardiovascular system and associated critical values. Provides general exercise guidelines with specific guidelines to those with heart or lung disease.    Education: Flexibility, Balance, Mind/Body Relaxation: - Group verbal and visual presentation with interactive activity on the components of exercise prescription.  Introduces F.I.T.T principle from ACSM for exercise prescriptions. Reviews F.I.T.T. principles of flexibility and balance exercise training including progression. Also discusses the mind body connection.  Reviews various relaxation techniques to help reduce and manage stress (i.e. Deep breathing, progressive muscle relaxation, and visualization). Balance handout provided to take home. Written material given at graduation.   Activity Barriers & Risk Stratification:  Activity Barriers & Cardiac Risk Stratification - 06/19/23 1550       Activity Barriers & Cardiac  Risk Stratification   Activity Barriers Shortness of Breath;Other (comment)    Comments Tightness in chest, not sure if incisional pain/scar tissue or angina.    Cardiac Risk Stratification High             6 Minute Walk:  6 Minute Walk     Row Name 06/19/23 1548         6 Minute Walk   Phase Initial     Distance 995 feet     Walk Time 5.77 minutes     # of Rest Breaks 1     MPH 1.96     METS 2.42     RPE 15     Perceived Dyspnea  1     VO2 Peak 8.47     Symptoms Yes (comment)     Comments Bilateral hip pain (8/10)     Resting HR 68 bpm     Resting BP 136/70     Resting Oxygen Saturation  97 %     Exercise Oxygen Saturation  during 6 min walk 94 %     Max Ex. HR 100 bpm     Max Ex. BP 158/80     2 Minute Post BP 150/80              Oxygen Initial Assessment:   Oxygen Re-Evaluation:   Oxygen Discharge (Final Oxygen Re-Evaluation):   Initial Exercise Prescription:  Initial Exercise Prescription - 06/19/23 1500       Date of Initial Exercise RX and Referring Provider   Date 06/19/23    Referring Provider End, Cristal Deer, MD      Oxygen   Maintain Oxygen Saturation 88% or higher      Recumbant Bike   Level 2    RPM 80    Watts 25    Minutes 15    METs 2.42      NuStep   Level 2    SPM 80    Minutes 15    METs 2.42      REL-XR   Level 1    Speed 50    Minutes 15    METs 2.42       T5 Nustep   Level 2    SPM 80    Minutes 15    METs 2.42      Biostep-RELP   Level 2    SPM 50    Minutes 15    METs 2.42      Track   Laps 26    Minutes 15    METs 2.42      Prescription Details   Frequency (times per week) 3    Duration Progress to 30 minutes of continuous aerobic without signs/symptoms of physical distress      Intensity   THRR 40-80% of Max Heartrate 101-134    Ratings of Perceived Exertion 11-13    Perceived Dyspnea 0-4      Progression   Progression Continue to progress workloads to maintain intensity without signs/symptoms of physical distress.      Resistance Training   Training Prescription Yes    Weight 2 lb    Reps 10-15             Perform Capillary Blood Glucose checks as needed.  Exercise Prescription Changes:   Exercise Prescription Changes     Row Name 06/19/23 1500             Response to Exercise   Blood  Pressure (Admit) 136/70       Blood Pressure (Exercise) 158/80       Blood Pressure (Exit) 130/80       Heart Rate (Admit) 68 bpm       Heart Rate (Exercise) 100 bpm       Heart Rate (Exit) 71 bpm       Oxygen Saturation (Admit) 97 %       Oxygen Saturation (Exercise) 94 %       Oxygen Saturation (Exit) 96 %       Rating of Perceived Exertion (Exercise) 15       Perceived Dyspnea (Exercise) 1       Symptoms Bilateral hip pain 8/10       Comments results       Duration Progress to 30 minutes of  aerobic without signs/symptoms of physical distress       Intensity THRR New         Progression   Progression Continue to progress workloads to maintain intensity without signs/symptoms of physical distress.       Average METs 2.42                Exercise Comments:   Exercise Comments     Row Name 07/01/23 1627           Exercise Comments First full day of exercise!  Patient was oriented to gym and equipment including functions, settings, policies, and procedures.  Patient's individual  exercise prescription and treatment plan were reviewed.  All starting workloads were established based on the results of the 6 minute walk test done at initial orientation visit.  The plan for exercise progression was also introduced and progression will be customized based on patient's performance and goals.                Exercise Goals and Review:   Exercise Goals     Row Name 06/19/23 1600             Exercise Goals   Increase Physical Activity Yes       Intervention Provide advice, education, support and counseling about physical activity/exercise needs.;Develop an individualized exercise prescription for aerobic and resistive training based on initial evaluation findings, risk stratification, comorbidities and participant's personal goals.       Expected Outcomes Short Term: Attend rehab on a regular basis to increase amount of physical activity.;Long Term: Add in home exercise to make exercise part of routine and to increase amount of physical activity.;Long Term: Exercising regularly at least 3-5 days a week.       Increase Strength and Stamina Yes       Intervention Provide advice, education, support and counseling about physical activity/exercise needs.;Develop an individualized exercise prescription for aerobic and resistive training based on initial evaluation findings, risk stratification, comorbidities and participant's personal goals.       Expected Outcomes Short Term: Increase workloads from initial exercise prescription for resistance, speed, and METs.;Short Term: Perform resistance training exercises routinely during rehab and add in resistance training at home;Long Term: Improve cardiorespiratory fitness, muscular endurance and strength as measured by increased METs and functional capacity ( )       Able to understand and use rate of perceived exertion (RPE) scale Yes       Intervention Provide education and explanation on how to use RPE scale       Expected Outcomes  Short Term: Able to use RPE daily in rehab to express subjective  intensity level;Long Term:  Able to use RPE to guide intensity level when exercising independently       Able to understand and use Dyspnea scale Yes       Intervention Provide education and explanation on how to use Dyspnea scale       Expected Outcomes Short Term: Able to use Dyspnea scale daily in rehab to express subjective sense of shortness of breath during exertion;Long Term: Able to use Dyspnea scale to guide intensity level when exercising independently       Knowledge and understanding of Target Heart Rate Range (THRR) Yes       Intervention Provide education and explanation of THRR including how the numbers were predicted and where they are located for reference       Expected Outcomes Short Term: Able to state/look up THRR;Short Term: Able to use daily as guideline for intensity in rehab;Long Term: Able to use THRR to govern intensity when exercising independently       Able to check pulse independently Yes       Intervention Provide education and demonstration on how to check pulse in carotid and radial arteries.;Review the importance of being able to check your own pulse for safety during independent exercise       Expected Outcomes Short Term: Able to explain why pulse checking is important during independent exercise;Long Term: Able to check pulse independently and accurately       Understanding of Exercise Prescription Yes       Intervention Provide education, explanation, and written materials on patient's individual exercise prescription       Expected Outcomes Short Term: Able to explain program exercise prescription;Long Term: Able to explain home exercise prescription to exercise independently                Exercise Goals Re-Evaluation :  Exercise Goals Re-Evaluation     Row Name 07/01/23 1627 07/08/23 1120           Exercise Goal Re-Evaluation   Exercise Goals Review Able to understand and use rate  of perceived exertion (RPE) scale;Able to understand and use Dyspnea scale;Knowledge and understanding of Target Heart Rate Range (THRR);Understanding of Exercise Prescription Increase Physical Activity;Increase Strength and Stamina;Understanding of Exercise Prescription      Comments Reviewed RPE and dyspnea scale, THR and program prescription with pt today.  Pt voiced understanding and was given a copy of goals to take home. Dillyn reports that when she walks on the track it bothers her hip. She is willing to try the TM to see if she tolerates walking better on the TM.      Expected Outcomes Short: Use RPE daily to regulate intensity. Long: Follow program prescription in THR. Short: try TM and see if it causes less pain in hip then walking to track. Long: become independent with exercise routine.               Discharge Exercise Prescription (Final Exercise Prescription Changes):  Exercise Prescription Changes - 06/19/23 1500       Response to Exercise   Blood Pressure (Admit) 136/70    Blood Pressure (Exercise) 158/80    Blood Pressure (Exit) 130/80    Heart Rate (Admit) 68 bpm    Heart Rate (Exercise) 100 bpm    Heart Rate (Exit) 71 bpm    Oxygen Saturation (Admit) 97 %    Oxygen Saturation (Exercise) 94 %    Oxygen Saturation (Exit) 96 %    Rating  of Perceived Exertion (Exercise) 15    Perceived Dyspnea (Exercise) 1    Symptoms Bilateral hip pain 8/10    Comments results    Duration Progress to 30 minutes of  aerobic without signs/symptoms of physical distress    Intensity THRR New      Progression   Progression Continue to progress workloads to maintain intensity without signs/symptoms of physical distress.    Average METs 2.42             Nutrition:  Target Goals: Understanding of nutrition guidelines, daily intake of sodium 1500mg , cholesterol 200mg , calories 30% from fat and 7% or less from saturated fats, daily to have 5 or more servings of fruits and  vegetables.  Education: All About Nutrition: -Group instruction provided by verbal, written material, interactive activities, discussions, models, and posters to present general guidelines for heart healthy nutrition including fat, fiber, MyPlate, the role of sodium in heart healthy nutrition, utilization of the nutrition label, and utilization of this knowledge for meal planning. Follow up email sent as well. Written material given at graduation. Flowsheet Row Cardiac Rehab from 07/03/2023 in [facility] Cardiac and Pulmonary Rehab  Date 07/03/23  Educator JG  Instruction Review Code 1- Verbalizes Understanding       Biometrics:  Pre Biometrics - 06/19/23 1601       Pre Biometrics   Height 5' 1.2" (1.554 m)    Weight 156 lb 1.6 oz (70.8 kg)    Waist Circumference 36.5 inches    Hip Circumference 38.2 inches    Waist to Hip Ratio 0.96 %    BMI (Calculated) 29.32    Single Leg Stand 7 seconds              Nutrition Therapy Plan and Nutrition Goals:  Nutrition Therapy & Goals - 06/19/23 1602       Personal Nutrition Goals   Nutrition Goal Will meet with RD in the next few weeks      Intervention Plan   Intervention Prescribe, educate and counsel regarding individualized specific dietary modifications aiming towards targeted core components such as weight, hypertension, lipid management, diabetes, heart failure and other comorbidities.;Nutrition handout(s) given to patient.    Expected Outcomes Short Term Goal: Understand basic principles of dietary content, such as calories, fat, sodium, cholesterol and nutrients.;Short Term Goal: A plan has been developed with personal nutrition goals set during dietitian appointment.;Long Term Goal: Adherence to prescribed nutrition plan.             Nutrition Assessments:  MEDIFICTS Score Key: >=70 Need to make dietary changes  40-70 Heart Healthy Diet <= 40 Therapeutic Level Cholesterol Diet  Flowsheet Row Cardiac Rehab from  06/19/2023 in [facility] Cardiac and Pulmonary Rehab  Picture Your Plate Total Score on Admission 53      Picture Your Plate Scores: <95 Unhealthy dietary pattern with much room for improvement. 41-50 Dietary pattern unlikely to meet recommendations for good health and room for improvement. 51-60 More healthful dietary pattern, with some room for improvement.  >60 Healthy dietary pattern, although there may be some specific behaviors that could be improved.    Nutrition Goals Re-Evaluation:  Nutrition Goals Re-Evaluation     Row Name 07/08/23 1116             Goals   Nutrition Goal Decided to defer RD apt for now. Will let us know if she changes her mind.  Nutrition Goals Discharge (Final Nutrition Goals Re-Evaluation):  Nutrition Goals Re-Evaluation - 07/08/23 1116       Goals   Nutrition Goal Decided to defer RD apt for now. Will let us know if she changes her mind.             Psychosocial: Target Goals: Acknowledge presence or absence of significant depression and/or stress, maximize coping skills, provide positive support system. Participant is able to verbalize types and ability to use techniques and skills needed for reducing stress and depression.   Education: Stress, Anxiety, and Depression - Group verbal and visual presentation to define topics covered.  Reviews how body is impacted by stress, anxiety, and depression.  Also discusses healthy ways to reduce stress and to treat/manage anxiety and depression.  Written material given at graduation.   Education: Sleep Hygiene -Provides group verbal and written instruction about how sleep can affect your health.  Define sleep hygiene, discuss sleep cycles and impact of sleep habits. Review good sleep hygiene tips.    Initial Review & Psychosocial Screening:  Initial Psych Review & Screening - 06/17/23 0954       Initial Review   Current issues with Current Depression;History of Depression;Current  Psychotropic Meds;Current Stress Concerns    Source of Stress Concerns Chronic Illness    Comments Caylei is working on her anxiety since she has had alot of procedures and health issues.      Family Dynamics   Good Support System? Yes    Comments She can look to her children, husband and preacher for support.      Barriers   Psychosocial barriers to participate in program The patient should benefit from training in stress management and relaxation.      Screening Interventions   Interventions Provide feedback about the scores to participant;To provide support and resources with identified psychosocial needs;Encouraged to exercise    Expected Outcomes Short Term goal: Utilizing psychosocial counselor, staff and physician to assist with identification of specific Stressors or current issues interfering with healing process. Setting desired goal for each stressor or current issue identified.;Long Term Goal: Stressors or current issues are controlled or eliminated.;Long Term goal: The participant improves quality of Life and PHQ9 Scores as seen by post scores and/or verbalization of changes;Short Term goal: Identification and review with participant of any Quality of Life or Depression concerns found by scoring the questionnaire.             Quality of Life Scores:   Quality of Life - 06/19/23 1602       Quality of Life   Select Quality of Life      Quality of Life Scores   Health/Function Pre 18.8 %    Socioeconomic Pre 21.75 %    Psych/Spiritual Pre 19.86 %    Family Pre 22.8 %    GLOBAL Pre 20.26 %            Scores of 19 and below usually indicate a poorer quality of life in these areas.  A difference of  2-3 points is a clinically meaningful difference.  A difference of 2-3 points in the total score of the Quality of Life Index has been associated with significant improvement in overall quality of life, self-image, physical symptoms, and general health in studies assessing  change in quality of life.  PHQ-9: Review Flowsheet       07/08/2023 06/19/2023  Depression screen PHQ 2/9  Decreased Interest 1 1  Down, Depressed, Hopeless 1 1  PHQ - 2 Score 2 2  Altered sleeping 2 2  Tired, decreased energy 2 2  Change in appetite 0 0  Feeling bad or failure about yourself  1 0  Trouble concentrating 0 0  Moving slowly or fidgety/restless 0 0  Suicidal thoughts 0 0  PHQ-9 Score 7 6  Difficult doing work/chores Somewhat difficult Somewhat difficult   Interpretation of Total Score  Total Score Depression Severity:  1-4 = Minimal depression, 5-9 = Mild depression, 10-14 = Moderate depression, 15-19 = Moderately severe depression, 20-27 = Severe depression   Psychosocial Evaluation and Intervention:  Psychosocial Evaluation - 06/17/23 0957       Psychosocial Evaluation & Interventions   Interventions Encouraged to exercise with the program and follow exercise prescription;Relaxation education;Stress management education    Comments Klyn is working on her anxiety since she has had alot of procedures and health issues.She can look to her children, husband and preacher for support.    Expected Outcomes Short: Attend Heart[employer] stress management education to decrease stress. Long: Maintain exercise Post HeartTrack to keep stress at a minimum.    Continue Psychosocial Services  Follow up required by staff             Psychosocial Re-Evaluation:  Psychosocial Re-Evaluation     Row Name 07/08/23 1123             Psychosocial Re-Evaluation   Current issues with Current Depression;Current Psychotropic Meds;Current Stress Concerns;History of Depression;Current Anxiety/Panic       Comments Patient reports that she is working with her doctor currently to adjust medicaiton to help with her anxiety and sleep patterns. She does have sleep concerns but is wokring closely with her doctor to [facility] these concerns. She states that her mental heath and stress levels  are currently are about the same and will notify staff or doctor is there are any changes or new concerns.       Expected Outcomes Short: continue to work with doctor on current med combination to help with anxiety and sleep. Long: mangage mental health to remain stable.       Interventions Encouraged to attend Cardiac Rehabilitation for the exercise       Continue Psychosocial Services  Follow up required by staff                Psychosocial Discharge (Final Psychosocial Re-Evaluation):  Psychosocial Re-Evaluation - 07/08/23 1123       Psychosocial Re-Evaluation   Current issues with Current Depression;Current Psychotropic Meds;Current Stress Concerns;History of Depression;Current Anxiety/Panic    Comments Patient reports that she is working with her doctor currently to adjust medicaiton to help with her anxiety and sleep patterns. She does have sleep concerns but is wokring closely with her doctor to [facility] these concerns. She states that her mental heath and stress levels are currently are about the same and will notify staff or doctor is there are any changes or new concerns.    Expected Outcomes Short: continue to work with doctor on current med combination to help with anxiety and sleep. Long: mangage mental health to remain stable.    Interventions Encouraged to attend Cardiac Rehabilitation for the exercise    Continue Psychosocial Services  Follow up required by staff             Vocational Rehabilitation: Provide vocational rehab assistance to qualifying candidates.   Vocational Rehab Evaluation & Intervention:   Education: Education Goals: Education classes will be provided on a variety  of topics geared toward better understanding of heart health and risk factor modification. Participant will state understanding/return demonstration of topics presented as noted by education test scores.  Learning Barriers/Preferences:  Learning Barriers/Preferences - 06/17/23 0953        Learning Barriers/Preferences   Learning Barriers None;Sight    Learning Preferences None             General Cardiac Education Topics:  AED/CPR: - Group verbal and written instruction with the use of models to demonstrate the basic use of the AED with the basic ABC's of resuscitation.   Anatomy and Cardiac Procedures: - Group verbal and visual presentation and models provide information about basic cardiac anatomy and function. Reviews the testing methods done to diagnose heart disease and the outcomes of the test results. Describes the treatment choices: Medical Management, Angioplasty, or Coronary Bypass Surgery for treating various heart conditions including Myocardial Infarction, Angina, Valve Disease, and Cardiac Arrhythmias.  Written material given at graduation. Flowsheet Row Cardiac Rehab from 07/03/2023 in [facility] Cardiac and Pulmonary Rehab  Education need identified 06/19/23       Medication Safety: - Group verbal and visual instruction to review commonly prescribed medications for heart and lung disease. Reviews the medication, class of the drug, and side effects. Includes the steps to properly store meds and maintain the prescription regimen.  Written material given at graduation.   Intimacy: - Group verbal instruction through game format to discuss how heart and lung disease can affect sexual intimacy. Written material given at graduation..   Know Your Numbers and Heart Failure: - Group verbal and visual instruction to discuss disease risk factors for cardiac and pulmonary disease and treatment options.  Reviews associated critical values for Overweight/Obesity, Hypertension, Cholesterol, and Diabetes.  Discusses basics of heart failure: signs/symptoms and treatments.  Introduces Heart Failure Zone chart for action plan for heart failure.  Written material given at graduation.   Infection Prevention: - Provides verbal and written material to individual with  discussion of infection control including proper hand washing and proper equipment cleaning during exercise session. Flowsheet Row Cardiac Rehab from 07/03/2023 in [facility] Cardiac and Pulmonary Rehab  Date 06/19/23  Educator MB  Instruction Review Code 1- Verbalizes Understanding       Falls Prevention: - Provides verbal and written material to individual with discussion of falls prevention and safety. Flowsheet Row Cardiac Rehab from 07/03/2023 in [facility] Cardiac and Pulmonary Rehab  Date 06/19/23  Educator MB  Instruction Review Code 1- Verbalizes Understanding       Other: -Provides group and verbal instruction on various topics (see comments)   Knowledge Questionnaire Score:  Knowledge Questionnaire Score - 06/19/23 1603       Knowledge Questionnaire Score   Pre Score 23/26             Core Components/Risk Factors/Patient Goals at Admission:  Personal Goals and Risk Factors at Admission - 06/19/23 1603       Core Components/Risk Factors/Patient Goals on Admission    Weight Management Yes;Weight Loss    Intervention Weight Management: Develop a combined nutrition and exercise program designed to reach desired caloric intake, while maintaining appropriate intake of nutrient and fiber, sodium and fats, and appropriate energy expenditure required for the weight goal.;Weight Management: Provide education and appropriate resources to help participant work on and attain dietary goals.;Weight Management/Obesity: Establish reasonable short term and long term weight goals.    Admit Weight 156 lb 1.6 oz (70.8 kg)    Goal  Weight: Short Term 148 lb 8 oz (67.4 kg)    Goal Weight: Long Term 141 lb (64 kg)    Expected Outcomes Short Term: Continue to assess and modify interventions until short term weight is achieved;Weight Loss: Understanding of general recommendations for a balanced deficit meal plan, which promotes 1-2 lb weight loss per week and includes a negative energy balance of  [phone] kcal/d;Understanding recommendations for meals to include 15-35% energy as protein, 25-35% energy from fat, 35-60% energy from carbohydrates, less than 200mg  of dietary cholesterol, 20-35 gm of total fiber daily;Understanding of distribution of calorie intake throughout the day with the consumption of 4-5 meals/snacks;Long Term: Adherence to nutrition and physical activity/exercise program aimed toward attainment of established weight goal    Hypertension Yes    Intervention Provide education on lifestyle modifcations including regular physical activity/exercise, weight management, moderate sodium restriction and increased consumption of fresh fruit, vegetables, and low fat dairy, alcohol moderation, and smoking cessation.;Monitor prescription use compliance.    Expected Outcomes Long Term: Maintenance of blood pressure at goal levels.;Short Term: Continued assessment and intervention until BP is < 140/50mm HG in hypertensive participants. < 130/47mm HG in hypertensive participants with diabetes, heart failure or chronic kidney disease.    Lipids Yes    Intervention Provide education and support for participant on nutrition & aerobic/resistive exercise along with prescribed medications to achieve LDL 70mg , HDL >40mg .    Expected Outcomes Short Term: Participant states understanding of desired cholesterol values and is compliant with medications prescribed. Participant is following exercise prescription and nutrition guidelines.;Long Term: Cholesterol controlled with medications as prescribed, with individualized exercise RX and with personalized nutrition plan. Value goals: LDL < 70mg , HDL > 40 mg.             Education:Diabetes - Individual verbal and written instruction to review signs/symptoms of diabetes, desired ranges of glucose level fasting, after meals and with exercise. Acknowledge that pre and post exercise glucose checks will be done for 3 sessions at entry of program.   Core  Components/Risk Factors/Patient Goals Review:   Goals and Risk Factor Review     Row Name 07/08/23 1117             Core Components/Risk Factors/Patient Goals Review   Personal Goals Review Hypertension;Lipids       Review Shellsea reports that she does monitor her blood pressure most days of the week at home. She also reports that she is taking all her cholesterol meds and is not having any problems currently with her medicaiton. She follows up with her doctors regularly for required tests and lab work to monitor cardiac risk factors.       Expected Outcomes Short: continue to check BP at home and monitor for any changes. Long: continue to manage cardiac risk factors with lifestyle choices.                Core Components/Risk Factors/Patient Goals at Discharge (Final Review):   Goals and Risk Factor Review - 07/08/23 1117       Core Components/Risk Factors/Patient Goals Review   Personal Goals Review Hypertension;Lipids    Review Meribeth reports that she does monitor her blood pressure most days of the week at home. She also reports that she is taking all her cholesterol meds and is not having any problems currently with her medicaiton. She follows up with her doctors regularly for required tests and lab work to monitor cardiac risk factors.    Expected Outcomes Short: continue  to check BP at home and monitor for any changes. Long: continue to manage cardiac risk factors with lifestyle choices.             ITP Comments:  ITP Comments     Row Name 06/17/23 0951 06/19/23 1541 07/01/23 1626 07/17/23 0949     ITP Comments Virtual Visit completed. Patient informed on EP and RD appointment and 6 Minute walk test. Patient also informed of patient health questionnaires on My Chart. Patient Verbalizes understanding. Visit diagnosis can be found in [facility] 03/12/[facility]23. Completed and gym orientation. Initial ITP created and sent for review to Dr. Daniel Nones, Medical Director. First full day  of exercise!  Patient was oriented to gym and equipment including functions, settings, policies, and procedures.  Patient's individual exercise prescription and treatment plan were reviewed.  All starting workloads were established based on the results of the 6 minute walk test done at initial orientation visit.  The plan for exercise progression was also introduced and progression will be customized based on patient's performance and goals. 30 Day review completed. Medical Director ITP review done, changes made as directed, and signed approval by Medical Director.    new to program             Comments:

## 2023-07-18 DIAGNOSIS — Z09 Encounter for follow-up examination after completed treatment for conditions other than malignant neoplasm: Secondary | ICD-10-CM | POA: Diagnosis not present

## 2023-07-18 DIAGNOSIS — Z006 Encounter for examination for normal comparison and control in clinical research program: Secondary | ICD-10-CM | POA: Diagnosis not present

## 2023-07-18 DIAGNOSIS — Z9862 Peripheral vascular angioplasty status: Secondary | ICD-10-CM | POA: Diagnosis not present

## 2023-07-18 DIAGNOSIS — Z95828 Presence of other vascular implants and grafts: Secondary | ICD-10-CM | POA: Diagnosis not present

## 2023-07-18 DIAGNOSIS — I716 Thoracoabdominal aortic aneurysm, without rupture, unspecified: Secondary | ICD-10-CM | POA: Diagnosis not present

## 2023-07-19 ENCOUNTER — Encounter: Admitting: *Deleted

## 2023-07-19 DIAGNOSIS — Z951 Presence of aortocoronary bypass graft: Secondary | ICD-10-CM

## 2023-07-19 NOTE — Progress Notes (Signed)
 Daily Session Note  Patient Details  Name: Kaylee Pope MRN: [id number] Date of Birth: 30-Jul-1955 Referring Provider:   Flowsheet Row Cardiac Rehab from 06/19/2023 in [facility] Cardiac and Pulmonary Rehab  Referring Provider End, Cristal Deer, MD       Encounter Date: 07/19/2023  Check In:  Session Check In - 07/19/23 1112       Check-In   Supervising physician immediately available to respond to emergencies See telemetry face sheet for immediately available ER MD    Location [facility]-Cardiac & Pulmonary Rehab    Staff Present Maxon Conetta BS, Exercise Physiologist;Noah Tickle, BS, Exercise Physiologist;Calyse Murcia, RN, BSN, CCRP;Joseph Hood RCP,RRT,BSRT    Virtual Visit No    Medication changes reported     No    Fall or balance concerns reported    No    Warm-up and Cool-down Performed on first and last piece of equipment    Resistance Training Performed Yes    VAD Patient? No    PAD/SET Patient? No      Pain Assessment   Currently in Pain? No/denies                Social History   Tobacco Use  Smoking Status Former   Current packs/day: 0.25   Average packs/day: 0.3 packs/day for 50.0 years (12.5 ttl pk-yrs)   Types: Cigarettes   Passive exposure: Current  Smokeless Tobacco Never  Tobacco Comments   Stopped smoking 4/46/2024    Goals Met:  Independence with exercise equipment Exercise tolerated well No report of concerns or symptoms today  Goals Unmet:  Not Applicable  Comments: Pt able to follow exercise prescription today without complaint.  Will continue to monitor for progression.    Dr. Bethann Punches is Medical Director for [facility] Cardiac Rehabilitation.  Dr. Vida Rigger is Medical Director for [facility] Pulmonary Rehabilitation.

## 2023-07-22 ENCOUNTER — Ambulatory Visit
Admission: RE | Admit: 2023-07-22 | Discharge: 2023-07-22 | Disposition: A | Discharge status: Home / Self Care | Source: Ambulatory Visit | Attending: Family Medicine | Admitting: Family Medicine

## 2023-07-22 ENCOUNTER — Encounter

## 2023-07-22 DIAGNOSIS — E279 Disorder of adrenal gland, unspecified: Secondary | ICD-10-CM | POA: Insufficient documentation

## 2023-07-22 DIAGNOSIS — E278 Other specified disorders of adrenal gland: Secondary | ICD-10-CM | POA: Diagnosis not present

## 2023-07-22 DIAGNOSIS — I719 Aortic aneurysm of unspecified site, without rupture: Secondary | ICD-10-CM | POA: Diagnosis not present

## 2023-07-22 MED ORDER — GADOBUTROL 1 MMOL/ML IV SOLN
7.0000 mL | Freq: Once | INTRAVENOUS | Status: AC | PRN
  Administered 2023-07-22: 7 mL via INTRAVENOUS

## 2023-07-24 ENCOUNTER — Encounter: Admitting: *Deleted

## 2023-07-24 DIAGNOSIS — Z951 Presence of aortocoronary bypass graft: Secondary | ICD-10-CM | POA: Diagnosis not present

## 2023-07-24 NOTE — Progress Notes (Signed)
 Daily Session Note  Patient Details  Name: Kaylee Pope MRN: [id number] Date of Birth: 11-08-1955 Referring Provider:   Flowsheet Row Cardiac Rehab from 06/19/2023 in [facility] Cardiac and Pulmonary Rehab  Referring Provider End, Veryl Gottron, MD       Encounter Date: 07/24/2023  Check In:  Session Check In - 07/24/23 1137       Check-In   Supervising physician immediately available to respond to emergencies See telemetry face sheet for immediately available ER MD    Location [facility]-Cardiac & Pulmonary Rehab    Staff Present Lyell Samuel, MS, Exercise Physiologist;Maxon Conetta BS, Exercise Physiologist;Alyas Creary, RN, BSN, CCRP;Meredith Craven RN,BSN    Virtual Visit No    Medication changes reported     No    Fall or balance concerns reported    No    Warm-up and Cool-down Performed on first and last piece of equipment    Resistance Training Performed Yes    VAD Patient? No    PAD/SET Patient? No      Pain Assessment   Currently in Pain? No/denies                Social History   Tobacco Use  Smoking Status Former   Current packs/day: 0.25   Average packs/day: 0.3 packs/day for 50.0 years (12.5 ttl pk-yrs)   Types: Cigarettes   Passive exposure: Current  Smokeless Tobacco Never  Tobacco Comments   Stopped smoking 4/46/2024    Goals Met:  Independence with exercise equipment Exercise tolerated well No report of concerns or symptoms today  Goals Unmet:  Not Applicable  Comments: Pt able to follow exercise prescription today without complaint.  Will continue to monitor for progression.    Dr. Firman Hughes is Medical Director for [facility] Cardiac Rehabilitation.  Dr. Fuad Aleskerov is Medical Director for [facility] Pulmonary Rehabilitation.

## 2023-07-26 ENCOUNTER — Other Ambulatory Visit (HOSPITAL_COMMUNITY): Payer: Self-pay

## 2023-07-26 ENCOUNTER — Encounter: Admitting: *Deleted

## 2023-07-26 DIAGNOSIS — Z951 Presence of aortocoronary bypass graft: Secondary | ICD-10-CM

## 2023-07-26 MED ORDER — ROSUVASTATIN CALCIUM 20 MG PO TABS: 20.0000 mg | ORAL_TABLET | Freq: Every day | ORAL | 3 refills | Status: DC

## 2023-07-26 MED ORDER — ROSUVASTATIN CALCIUM 20 MG PO TABS
20.0000 mg | ORAL_TABLET | Freq: Every day | ORAL | 3 refills | Status: AC
  Filled 2023-07-26 – 2023-07-30 (×3): qty 90, 90d supply, fill #0
  Filled 2023-10-23 – 2023-10-24 (×5): qty 90, 90d supply, fill #1
  Filled 2024-01-12: qty 90, 90d supply, fill #2
  Filled 2024-04-19: qty 90, 90d supply, fill #3

## 2023-07-26 NOTE — Progress Notes (Unsigned)
 Daily Session Note  Patient Details  Name: Kaylee Pope MRN: [id number] Date of Birth: 04-Apr-1956 Referring Provider:   Flowsheet Row Cardiac Rehab from 06/19/2023 in [facility] Cardiac and Pulmonary Rehab  Referring Provider End, Veryl Gottron, MD       Encounter Date: 07/26/2023  Check In:  Session Check In - 07/26/23 1130       Check-In   Supervising physician immediately available to respond to emergencies See telemetry face sheet for immediately available ER MD    Location [facility]-Cardiac & Pulmonary Rehab    Staff Present Sherle Dire, BS, Exercise Physiologist;Kelly Dawne Euler, ACSM CEP, Exercise Physiologist;Alfonzia Woolum RN,BSN;Maxon Conetta BS, Exercise Physiologist    Virtual Visit No    Medication changes reported     No    Fall or balance concerns reported    No    Warm-up and Cool-down Performed on first and last piece of equipment    Resistance Training Performed Yes    VAD Patient? No    PAD/SET Patient? No      Pain Assessment   Currently in Pain? No/denies                Social History   Tobacco Use  Smoking Status Former   Current packs/day: 0.25   Average packs/day: 0.3 packs/day for 50.0 years (12.5 ttl pk-yrs)   Types: Cigarettes   Passive exposure: Current  Smokeless Tobacco Never  Tobacco Comments   Stopped smoking 4/46/2024    Goals Met:  Independence with exercise equipment Exercise tolerated well No report of concerns or symptoms today Strength training completed today  Goals Unmet:  Not Applicable  Comments: Pt able to follow exercise prescription today without complaint.  Will continue to monitor for progression.    Dr. Firman Hughes is Medical Director for [facility] Cardiac Rehabilitation.  Dr. Fuad Aleskerov is Medical Director for [facility] Pulmonary Rehabilitation.

## 2023-07-29 ENCOUNTER — Other Ambulatory Visit (HOSPITAL_COMMUNITY): Payer: Self-pay

## 2023-07-29 ENCOUNTER — Encounter: Admitting: *Deleted

## 2023-07-29 ENCOUNTER — Other Ambulatory Visit: Payer: Self-pay

## 2023-07-29 DIAGNOSIS — Z951 Presence of aortocoronary bypass graft: Secondary | ICD-10-CM | POA: Diagnosis not present

## 2023-07-29 MED ORDER — LEVOTHYROXINE SODIUM 75 MCG PO TABS
75.0000 ug | ORAL_TABLET | Freq: Every morning | ORAL | 3 refills | Status: AC
  Filled 2023-07-29: qty 30, 30d supply, fill #0
  Filled 2023-09-05: qty 90, 90d supply, fill #1
  Filled 2023-12-04: qty 90, 90d supply, fill #2
  Filled 2024-02-16: qty 90, 90d supply, fill #3

## 2023-07-29 MED ORDER — VENLAFAXINE HCL ER 150 MG PO CP24
150.0000 mg | ORAL_CAPSULE | Freq: Every day | ORAL | 3 refills | Status: AC
  Filled 2023-07-29: qty 90, 90d supply, fill #0
  Filled 2023-10-29: qty 90, 90d supply, fill #1
  Filled 2024-01-29: qty 90, 90d supply, fill #2

## 2023-07-29 MED ORDER — BUSPIRONE HCL 7.5 MG PO TABS
7.5000 mg | ORAL_TABLET | Freq: Two times a day (BID) | ORAL | 0 refills | Status: DC | PRN
  Filled 2023-07-29 – 2023-08-01 (×2): qty 60, 30d supply, fill #0

## 2023-07-29 NOTE — Progress Notes (Signed)
 Daily Session Note  Patient Details  Name: Kaylee Pope MRN: [id number] Date of Birth: 1956/01/26 Referring Provider:   Flowsheet Row Cardiac Rehab from 06/19/2023 in [facility] Cardiac and Pulmonary Rehab  Referring Provider End, Veryl Gottron, MD       Encounter Date: 07/29/2023  Check In:  Session Check In - 07/29/23 1112       Check-In   Supervising physician immediately available to respond to emergencies See telemetry face sheet for immediately available ER MD    Location [facility]-Cardiac & Pulmonary Rehab    Staff Present Maud Sorenson, RN, BSN, CCRP;Joseph Hood RCP,RRT,BSRT;Kelly Burnside BS, ACSM CEP, Exercise Physiologist;Maxon Conetta BS, Exercise Physiologist    Virtual Visit No    Medication changes reported     No    Fall or balance concerns reported    No    Warm-up and Cool-down Performed on first and last piece of equipment    Resistance Training Performed Yes    VAD Patient? No    PAD/SET Patient? No      Pain Assessment   Currently in Pain? No/denies                Social History   Tobacco Use  Smoking Status Former   Current packs/day: 0.25   Average packs/day: 0.3 packs/day for 50.0 years (12.5 ttl pk-yrs)   Types: Cigarettes   Passive exposure: Current  Smokeless Tobacco Never  Tobacco Comments   Stopped smoking 4/46/2024    Goals Met:  Independence with exercise equipment Exercise tolerated well No report of concerns or symptoms today  Goals Unmet:  Not Applicable  Comments: Pt able to follow exercise prescription today without complaint.  Will continue to monitor for progression.    Dr. Firman Hughes is Medical Director for [facility] Cardiac Rehabilitation.  Dr. Fuad Aleskerov is Medical Director for [facility] Pulmonary Rehabilitation.

## 2023-07-30 ENCOUNTER — Other Ambulatory Visit (HOSPITAL_COMMUNITY): Payer: Self-pay

## 2023-07-30 ENCOUNTER — Other Ambulatory Visit: Payer: Self-pay

## 2023-07-31 ENCOUNTER — Encounter: Admitting: *Deleted

## 2023-07-31 DIAGNOSIS — Z951 Presence of aortocoronary bypass graft: Secondary | ICD-10-CM

## 2023-07-31 NOTE — Progress Notes (Signed)
 Daily Session Note  Patient Details  Name: Kaylee Pope MRN: [id number] Date of Birth: Mar 04, 1956 Referring Provider:   Flowsheet Row Cardiac Rehab from 06/19/2023 in [facility] Cardiac and Pulmonary Rehab  Referring Provider End, Veryl Gottron, MD       Encounter Date: 07/31/2023  Check In:  Session Check In - 07/31/23 1116       Check-In   Supervising physician immediately available to respond to emergencies See telemetry face sheet for immediately available ER MD    Location [facility]-Cardiac & Pulmonary Rehab    Staff Present Lyell Samuel, MS, Exercise Physiologist;Maxon Beauford Bounds, Exercise Physiologist;Joseph Hood RCP,RRT,BSRT;Meredith Craven RN,BSN;Brinlynn Gorton, RN, BSN, CCRP    Virtual Visit No    Medication changes reported     No    Fall or balance concerns reported    No    Warm-up and Cool-down Performed on first and last piece of equipment    Resistance Training Performed Yes    VAD Patient? No    PAD/SET Patient? No      Pain Assessment   Currently in Pain? No/denies                Social History   Tobacco Use  Smoking Status Former   Current packs/day: 0.25   Average packs/day: 0.3 packs/day for 50.0 years (12.5 ttl pk-yrs)   Types: Cigarettes   Passive exposure: Current  Smokeless Tobacco Never  Tobacco Comments   Stopped smoking 4/46/2024    Goals Met:  Independence with exercise equipment Exercise tolerated well No report of concerns or symptoms today  Goals Unmet:  Not Applicable  Comments: Pt able to follow exercise prescription today without complaint.  Will continue to monitor for progression.    Dr. Firman Hughes is Medical Director for [facility] Cardiac Rehabilitation.  Dr. Fuad Aleskerov is Medical Director for [facility] Pulmonary Rehabilitation.

## 2023-08-01 ENCOUNTER — Other Ambulatory Visit: Payer: Self-pay

## 2023-08-01 ENCOUNTER — Other Ambulatory Visit (HOSPITAL_COMMUNITY): Payer: Self-pay

## 2023-08-02 ENCOUNTER — Encounter: Admitting: *Deleted

## 2023-08-02 DIAGNOSIS — Z951 Presence of aortocoronary bypass graft: Secondary | ICD-10-CM | POA: Diagnosis not present

## 2023-08-02 NOTE — Progress Notes (Signed)
 Daily Session Note  Patient Details  Name: Kaylee Pope MRN: [id number] Date of Birth: Aug 09, 1955 Referring Provider:   Flowsheet Row Cardiac Rehab from 06/19/2023 in [facility] Cardiac and Pulmonary Rehab  Referring Provider End, Veryl Gottron, MD       Encounter Date: 08/02/2023  Check In:  Session Check In - 08/02/23 1111       Check-In   Supervising physician immediately available to respond to emergencies See telemetry face sheet for immediately available ER MD    Location [facility]-Cardiac & Pulmonary Rehab    Staff Present Maxon Conetta BS, Exercise Physiologist;Noah Tickle, BS, Exercise Physiologist;Osama Coleson, RN, BSN, CCRP;Joseph Hood RCP,RRT,BSRT    Virtual Visit No    Medication changes reported     No    Fall or balance concerns reported    No    Warm-up and Cool-down Performed on first and last piece of equipment    Resistance Training Performed Yes    VAD Patient? No    PAD/SET Patient? No      Pain Assessment   Currently in Pain? No/denies                Social History   Tobacco Use  Smoking Status Former   Current packs/day: 0.25   Average packs/day: 0.3 packs/day for 50.0 years (12.5 ttl pk-yrs)   Types: Cigarettes   Passive exposure: Current  Smokeless Tobacco Never  Tobacco Comments   Stopped smoking 4/46/2024    Goals Met:  Independence with exercise equipment Exercise tolerated well No report of concerns or symptoms today  Goals Unmet:  Not Applicable  Comments: Pt able to follow exercise prescription today without complaint.  Will continue to monitor for progression.    Dr. Firman Hughes is Medical Director for [facility] Cardiac Rehabilitation.  Dr. Fuad Aleskerov is Medical Director for [facility] Pulmonary Rehabilitation.

## 2023-08-05 ENCOUNTER — Encounter: Admitting: *Deleted

## 2023-08-05 DIAGNOSIS — Z951 Presence of aortocoronary bypass graft: Secondary | ICD-10-CM | POA: Diagnosis not present

## 2023-08-05 NOTE — Progress Notes (Signed)
 Daily Session Note  Patient Details  Name: Kaylee Pope MRN: [id number] Date of Birth: 03/12/1956 Referring Provider:   Flowsheet Row Cardiac Rehab from 06/19/2023 in [facility] Cardiac and Pulmonary Rehab  Referring Provider End, Veryl Gottron, MD       Encounter Date: 08/05/2023  Check In:  Session Check In - 08/05/23 1118       Check-In   Supervising physician immediately available to respond to emergencies See telemetry face sheet for immediately available ER MD    Location [facility]-Cardiac & Pulmonary Rehab    Staff Present Maud Sorenson, RN, BSN, CCRP;Joseph Hood RCP,RRT,BSRT;Pope Craven RN,BSN;Kelly Kingstowne BS, ACSM CEP, Exercise Physiologist;Maxon Conetta BS, Exercise Physiologist;Jason Martina Sledge RDN,LDN    Virtual Visit No    Medication changes reported     No    Fall or balance concerns reported    No    Warm-up and Cool-down Performed on first and last piece of equipment    Resistance Training Performed Yes    VAD Patient? No    PAD/SET Patient? No      Pain Assessment   Currently in Pain? No/denies                Social History   Tobacco Use  Smoking Status Former   Current packs/day: 0.25   Average packs/day: 0.3 packs/day for 50.0 years (12.5 ttl pk-yrs)   Types: Cigarettes   Passive exposure: Current  Smokeless Tobacco Never  Tobacco Comments   Stopped smoking 4/46/2024    Goals Met:  Independence with exercise equipment Exercise tolerated well Personal goals reviewed No report of concerns or symptoms today  Goals Unmet:  Not Applicable  Comments: Pt able to follow exercise prescription today without complaint.  Will continue to monitor for progression.   Reviewed home exercise with pt today.  Pt plans to walk as her hip tolerates it, look into gym membership and do strength and stretching exercises at home for exercise.  Reviewed THR, pulse, RPE, sign and symptoms, pulse oximetery and when to call 911 or MD.  Also discussed weather considerations  and indoor options.  Pt voiced understanding.   Dr. Firman Hughes is Medical Director for [facility] Cardiac Rehabilitation.  Dr. Fuad Aleskerov is Medical Director for [facility] Pulmonary Rehabilitation.

## 2023-08-07 ENCOUNTER — Encounter: Admitting: *Deleted

## 2023-08-07 DIAGNOSIS — Z951 Presence of aortocoronary bypass graft: Secondary | ICD-10-CM

## 2023-08-07 NOTE — Progress Notes (Signed)
 Daily Session Note  Patient Details  Name: Kaylee Pope MRN: [id number] Date of Birth: 1956-01-15 Referring Provider:   Flowsheet Row Cardiac Rehab from 06/19/2023 in [facility] Cardiac and Pulmonary Rehab  Referring Provider End, Veryl Gottron, MD       Encounter Date: 08/07/2023  Check In:  Session Check In - 08/07/23 1429       Check-In   Supervising physician immediately available to respond to emergencies See telemetry face sheet for immediately available ER MD    Location [facility]-Cardiac & Pulmonary Rehab    Staff Present Maxon Conetta BS, Exercise Physiologist;Noah Tickle, BS, Exercise Physiologist;Joseph Hood RCP,RRT,BSRT;Maud Sorenson, RN, BSN, CCRP    Virtual Visit No    Medication changes reported     No    Fall or balance concerns reported    No    Warm-up and Cool-down Performed on first and last piece of equipment    Resistance Training Performed Yes    VAD Patient? No    PAD/SET Patient? No      Pain Assessment   Currently in Pain? No/denies                Social History   Tobacco Use  Smoking Status Former   Current packs/day: 0.25   Average packs/day: 0.3 packs/day for 50.0 years (12.5 ttl pk-yrs)   Types: Cigarettes   Passive exposure: Current  Smokeless Tobacco Never  Tobacco Comments   Stopped smoking 4/46/2024    Goals Met:  Independence with exercise equipment Exercise tolerated well No report of concerns or symptoms today  Goals Unmet:  Not Applicable  Comments: Pt able to follow exercise prescription today without complaint.  Will continue to monitor for progression.    Dr. Firman Hughes is Medical Director for [facility] Cardiac Rehabilitation.  Dr. Fuad Aleskerov is Medical Director for [facility] Pulmonary Rehabilitation.

## 2023-08-09 ENCOUNTER — Encounter: Attending: Internal Medicine | Admitting: *Deleted

## 2023-08-09 DIAGNOSIS — Z951 Presence of aortocoronary bypass graft: Secondary | ICD-10-CM | POA: Insufficient documentation

## 2023-08-09 NOTE — Progress Notes (Signed)
 Daily Session Note  Patient Details  Name: Kaylee Pope MRN: [id number] Date of Birth: 02-May-1955 Referring Provider:   Flowsheet Row Cardiac Rehab from 06/19/2023 in [facility] Cardiac and Pulmonary Rehab  Referring Provider End, Veryl Gottron, MD       Encounter Date: 08/09/2023  Check In:  Session Check In - 08/09/23 1116       Check-In   Supervising physician immediately available to respond to emergencies See telemetry face sheet for immediately available ER MD    Location [facility]-Cardiac & Pulmonary Rehab    Staff Present Maud Sorenson, RN, BSN, CCRP;Joseph Hood RCP,RRT,BSRT;Maxon [place] BS, Exercise Physiologist;Noah Tickle, BS, Exercise Physiologist    Virtual Visit No    Medication changes reported     No    Fall or balance concerns reported    No    Warm-up and Cool-down Performed on first and last piece of equipment    Resistance Training Performed Yes    VAD Patient? No    PAD/SET Patient? No      Pain Assessment   Currently in Pain? No/denies                Social History   Tobacco Use  Smoking Status Former   Current packs/day: 0.25   Average packs/day: 0.3 packs/day for 50.0 years (12.5 ttl pk-yrs)   Types: Cigarettes   Passive exposure: Current  Smokeless Tobacco Never  Tobacco Comments   Stopped smoking 4/46/2024    Goals Met:  Independence with exercise equipment Exercise tolerated well No report of concerns or symptoms today  Goals Unmet:  Not Applicable  Comments: Pt able to follow exercise prescription today without complaint.  Will continue to monitor for progression.    Dr. Firman Hughes is Medical Director for [facility] Cardiac Rehabilitation.  Dr. Fuad Aleskerov is Medical Director for [facility] Pulmonary Rehabilitation.

## 2023-08-12 ENCOUNTER — Encounter

## 2023-08-12 ENCOUNTER — Other Ambulatory Visit: Payer: Self-pay

## 2023-08-12 ENCOUNTER — Other Ambulatory Visit (HOSPITAL_COMMUNITY): Payer: Self-pay

## 2023-08-12 DIAGNOSIS — E785 Hyperlipidemia, unspecified: Secondary | ICD-10-CM | POA: Diagnosis not present

## 2023-08-12 DIAGNOSIS — I10 Essential (primary) hypertension: Secondary | ICD-10-CM | POA: Diagnosis not present

## 2023-08-12 DIAGNOSIS — J449 Chronic obstructive pulmonary disease, unspecified: Secondary | ICD-10-CM | POA: Diagnosis not present

## 2023-08-12 DIAGNOSIS — E039 Hypothyroidism, unspecified: Secondary | ICD-10-CM | POA: Diagnosis not present

## 2023-08-12 DIAGNOSIS — T82330A Leakage of aortic (bifurcation) graft (replacement), initial encounter: Secondary | ICD-10-CM | POA: Diagnosis not present

## 2023-08-12 DIAGNOSIS — I716 Thoracoabdominal aortic aneurysm, without rupture, unspecified: Secondary | ICD-10-CM | POA: Diagnosis not present

## 2023-08-12 DIAGNOSIS — I2510 Atherosclerotic heart disease of native coronary artery without angina pectoris: Secondary | ICD-10-CM | POA: Diagnosis not present

## 2023-08-12 DIAGNOSIS — Z951 Presence of aortocoronary bypass graft: Secondary | ICD-10-CM | POA: Diagnosis not present

## 2023-08-12 MED ORDER — CARVEDILOL 12.5 MG PO TABS
12.5000 mg | ORAL_TABLET | Freq: Two times a day (BID) | ORAL | 0 refills | Status: DC
  Filled 2023-08-30: qty 60, 30d supply, fill #0

## 2023-08-12 MED ORDER — CLOPIDOGREL BISULFATE 75 MG PO TABS
75.0000 mg | ORAL_TABLET | Freq: Every day | ORAL | 0 refills | Status: DC
  Filled 2023-08-12: qty 30, 30d supply, fill #0

## 2023-08-14 ENCOUNTER — Encounter

## 2023-08-14 DIAGNOSIS — Z951 Presence of aortocoronary bypass graft: Secondary | ICD-10-CM

## 2023-08-14 NOTE — Progress Notes (Signed)
 Cardiac Individual Treatment Plan  Patient Details  Name: BRYTTNI WISSINK MRN: [id number] Date of Birth: 1955-05-16 Referring Provider:   Flowsheet Row Cardiac Rehab from 06/19/2023 in [facility] Cardiac and Pulmonary Rehab  Referring Provider End, Veryl Gottron, MD       Initial Encounter Date:  Flowsheet Row Cardiac Rehab from 06/19/2023 in [facility] Cardiac and Pulmonary Rehab  Date 06/19/23       Visit Diagnosis: S/P CABG x 4  Patient's Home Medications on Admission:  Current Outpatient Medications:    acetaminophen  (TYLENOL ) 500 MG tablet, Take 1,000 mg by mouth every 6 (six) hours as needed for moderate pain., Disp: , Rfl:    ALPRAZolam (XANAX) 0.5 MG tablet, Take 0.5 mg by mouth daily as needed. PRN, Disp: , Rfl:    amLODipine  (NORVASC ) 10 MG tablet, Take 1 tablet (10 mg total) by mouth daily. (Patient taking differently: Take 10 mg by mouth daily. Patient states that she is taking 5 mg daily), Disp: 90 tablet, Rfl: 3   amLODipine  (NORVASC ) 5 MG tablet, Take 1 tablet (5 mg total) by mouth daily., Disp: 90 tablet, Rfl: 3   aspirin  EC 81 MG tablet, Take 81 mg by mouth daily. Swallow whole., Disp: , Rfl:    busPIRone  (BUSPAR ) 5 MG tablet, Take 1 tablet (5 mg total) by mouth 2 (two) times daily (Patient not taking: Reported on 07/15/2023), Disp: 60 tablet, Rfl: 11   busPIRone  (BUSPAR ) 7.5 MG tablet, Take 1 tablet (7.5 mg total) by mouth 2 (two) times daily as needed., Disp: 60 tablet, Rfl: 0   carvedilol  (COREG ) 12.5 MG tablet, Take 1 tablet (12.5 mg total) by mouth 2 (two) times daily., Disp: 180 tablet, Rfl: 3   carvedilol  (COREG ) 12.5 MG tablet, Take 1 tablet (12.5 mg total) by mouth 2 (two) times daily., Disp: 60 tablet, Rfl: 0   clopidogrel (PLAVIX) 75 MG tablet, Take 1 tablet (75 mg total) by mouth daily., Disp: 30 tablet, Rfl: 0   furosemide  (LASIX ) 20 MG tablet, Take 1 tablet (20 mg total) by mouth daily as needed (shortness of breath). (Patient taking differently: Take 20 mg by mouth  daily.), Disp: 90 tablet, Rfl: 3   levothyroxine  (SYNTHROID ) 75 MCG tablet, Take 1 tablet (75 mcg total) by mouth on empty stomach with a glass of water at least 30-60 minutes before breakfast., Disp: 90 tablet, Rfl: 3   rosuvastatin  (CRESTOR ) 20 MG tablet, Take 1 tablet (20 mg total) by mouth daily., Disp: 90 tablet, Rfl: 3   traZODone  (DESYREL ) 50 MG tablet, Take 1 tablet (50 mg total) by mouth at bedtime, Disp: 90 tablet, Rfl: 3   venlafaxine  XR (EFFEXOR -XR) 150 MG 24 hr capsule, Take 1 capsule (150 mg total) by mouth daily., Disp: 90 capsule, Rfl: 3  Past Medical History: Past Medical History:  Diagnosis Date   Allergy    CAD (coronary artery disease)    a. s/p 4-V CABG (LIMA-LAD, VG-diag, VG-OM, VG-PDA) 07/2022   Hyperlipidemia    Hypertension    Thoracoabdominal aortic aneurysm (HCC)    Thyroid  disease     Tobacco Use: Social History   Tobacco Use  Smoking Status Former   Current packs/day: 0.25   Average packs/day: 0.3 packs/day for 50.0 years (12.5 ttl pk-yrs)   Types: Cigarettes   Passive exposure: Current  Smokeless Tobacco Never  Tobacco Comments   Stopped smoking 4/46/2024    Labs: Review Flowsheet  More data exists      Latest Ref Rng & Units 10/20/2014  07/26/2022 07/27/2022 07/28/2022 09/21/2022  Labs for ITP Cardiac and Pulmonary Rehab  Cholestrol 0 - 200 mg/dL 161  - - - 096   LDL (calc) 0 - 99 mg/dL - - - - 56   Direct LDL 0 - 99 mg/dL 045.4  - - - 64   HDL-C >40 mg/dL 09.81  - - - 52   Trlycerides <150 mg/dL 191.4  - - - 782   Hemoglobin A1c 4.8 - 5.6 % - - 5.9  - -  PH, Arterial 7.35 - 7.45 - 7.43  7.295  7.278  7.421  7.355  7.371  7.267  7.302  7.288  -  PCO2 arterial 32 - 48 mmHg - 47  54.6  53.2  38.5  43.1  48.3  52.1  50.6  56.6  -  Bicarbonate 20.0 - 28.0 mmol/L - 31.9  26.7  25.2  25.0  24.0  28.0  23.7  25.0  27.2  -  TCO2 22 - 32 mmol/L - - 28  27  28  26  27  28  25  31  31  29  25  27  29   -  Acid-base deficit 0.0 - 2.0 mmol/L - - 2.0  1.0  3.0   2.0  -  O2 Saturation % - 93.2  97  98  100  100  100  95  98  97  -    Details       Multiple values from one day are sorted in reverse-chronological order          Exercise Target Goals: Exercise Program Goal: Individual exercise prescription set using results from initial 6 min walk test and THRR while considering  patient's activity barriers and safety.   Exercise Prescription Goal: Initial exercise prescription builds to 30-45 minutes a day of aerobic activity, 2-3 days per week.  Home exercise guidelines will be given to patient during program as part of exercise prescription that the participant will acknowledge.   Education: Aerobic Exercise: - Group verbal and visual presentation on the components of exercise prescription. Introduces F.I.T.T principle from ACSM for exercise prescriptions.  Reviews F.I.T.T. principles of aerobic exercise including progression. Written material given at graduation. Flowsheet Row Cardiac Rehab from 07/03/2023 in [facility] Cardiac and Pulmonary Rehab  Education need identified 06/19/23       Education: Resistance Exercise: - Group verbal and visual presentation on the components of exercise prescription. Introduces F.I.T.T principle from ACSM for exercise prescriptions  Reviews F.I.T.T. principles of resistance exercise including progression. Written material given at graduation.    Education: Exercise & Equipment Safety: - Individual verbal instruction and demonstration of equipment use and safety with use of the equipment. Flowsheet Row Cardiac Rehab from 07/03/2023 in [facility] Cardiac and Pulmonary Rehab  Date 06/19/23  Educator MB  Instruction Review Code 1- Verbalizes Understanding       Education: Exercise Physiology & General Exercise Guidelines: - Group verbal and written instruction with models to review the exercise physiology of the cardiovascular system and associated critical values. Provides general exercise guidelines with  specific guidelines to those with heart or lung disease.    Education: Flexibility, Balance, Mind/Body Relaxation: - Group verbal and visual presentation with interactive activity on the components of exercise prescription. Introduces F.I.T.T principle from ACSM for exercise prescriptions. Reviews F.I.T.T. principles of flexibility and balance exercise training including progression. Also discusses the mind body connection.  Reviews various relaxation techniques to help reduce and manage stress (i.e.  Deep breathing, progressive muscle relaxation, and visualization). Balance handout provided to take home. Written material given at graduation.   Activity Barriers & Risk Stratification:  Activity Barriers & Cardiac Risk Stratification - 06/19/23 1550       Activity Barriers & Cardiac Risk Stratification   Activity Barriers Shortness of Breath;Other (comment)    Comments Tightness in chest, not sure if incisional pain/scar tissue or angina.    Cardiac Risk Stratification High             6 Minute Walk:  6 Minute Walk     Row Name 06/19/23 1548         6 Minute Walk   Phase Initial     Distance 995 feet     Walk Time 5.77 minutes     # of Rest Breaks 1     MPH 1.96     METS 2.42     RPE 15     Perceived Dyspnea  1     VO2 Peak 8.47     Symptoms Yes (comment)     Comments Bilateral hip pain (8/10)     Resting HR 68 bpm     Resting BP 136/70     Resting Oxygen Saturation  97 %     Exercise Oxygen Saturation  during 6 min walk 94 %     Max Ex. HR 100 bpm     Max Ex. BP 158/80     2 Minute Post BP 150/80              Oxygen Initial Assessment:   Oxygen Re-Evaluation:   Oxygen Discharge (Final Oxygen Re-Evaluation):   Initial Exercise Prescription:  Initial Exercise Prescription - 06/19/23 1500       Date of Initial Exercise RX and Referring Provider   Date 06/19/23    Referring Provider End, Veryl Gottron, MD      Oxygen   Maintain Oxygen Saturation 88%  or higher      Recumbant Bike   Level 2    RPM 80    Watts 25    Minutes 15    METs 2.42      NuStep   Level 2    SPM 80    Minutes 15    METs 2.42      REL-XR   Level 1    Speed 50    Minutes 15    METs 2.42      T5 Nustep   Level 2    SPM 80    Minutes 15    METs 2.42      Biostep-RELP   Level 2    SPM 50    Minutes 15    METs 2.42      Track   Laps 26    Minutes 15    METs 2.42      Prescription Details   Frequency (times per week) 3    Duration Progress to 30 minutes of continuous aerobic without signs/symptoms of physical distress      Intensity   THRR 40-80% of Max Heartrate 101-134    Ratings of Perceived Exertion 11-13    Perceived Dyspnea 0-4      Progression   Progression Continue to progress workloads to maintain intensity without signs/symptoms of physical distress.      Resistance Training   Training Prescription Yes    Weight 2 lb    Reps 10-15  Perform Capillary Blood Glucose checks as needed.  Exercise Prescription Changes:   Exercise Prescription Changes     Row Name 06/19/23 1500 07/17/23 1500 07/31/23 1500 08/05/23 1100 08/13/23 1000     Response to Exercise   Blood Pressure (Admit) 136/70 112/64 122/80 -- 112/60   Blood Pressure (Exercise) 158/80 140/64 130/72 -- 124/64   Blood Pressure (Exit) 130/80 122/62 112/64 -- 122/66   Heart Rate (Admit) 68 bpm 72 bpm 71 bpm -- 89 bpm   Heart Rate (Exercise) 100 bpm 101 bpm 104 bpm -- 104 bpm   Heart Rate (Exit) 71 bpm 82 bpm 78 bpm -- 77 bpm   Oxygen Saturation (Admit) 97 % -- -- -- --   Oxygen Saturation (Exercise) 94 % -- -- -- --   Oxygen Saturation (Exit) 96 % -- -- -- --   Rating of Perceived Exertion (Exercise) 15 15 13  -- 15   Perceived Dyspnea (Exercise) 1 0 -- -- --   Symptoms Bilateral hip pain 8/10 none none -- none   Comments results first 2 weeks of exercise -- -- --   Duration Progress to 30 minutes of  aerobic without signs/symptoms of  physical distress Progress to 30 minutes of  aerobic without signs/symptoms of physical distress Continue with 30 min of aerobic exercise without signs/symptoms of physical distress. Continue with 30 min of aerobic exercise without signs/symptoms of physical distress. Continue with 30 min of aerobic exercise without signs/symptoms of physical distress.   Intensity THRR New THRR unchanged THRR unchanged THRR unchanged THRR unchanged     Progression   Progression Continue to progress workloads to maintain intensity without signs/symptoms of physical distress. Continue to progress workloads to maintain intensity without signs/symptoms of physical distress. Continue to progress workloads to maintain intensity without signs/symptoms of physical distress. Continue to progress workloads to maintain intensity without signs/symptoms of physical distress. Continue to progress workloads to maintain intensity without signs/symptoms of physical distress.   Average METs 2.42 2.3 2.81 2.81 2.36     Resistance Training   Training Prescription -- Yes Yes Yes Yes   Weight -- 2 2 lb 2 lb 2 lb   Reps -- 10-15 10-15 10-15 10-15     Interval Training   Interval Training -- No No No No     Recumbant Bike   Level -- 1 2 2 1    Watts -- 25 20 20 16    Minutes -- 15 15 15 15    METs -- 3.09 3.08 3.08 2.69     NuStep   Level -- -- 1 1 3    Minutes -- -- 15 15 15    METs -- -- 2.4 2.4 2.5     REL-XR   Level -- 3 -- -- 1   Minutes -- 15 -- -- 15   METs -- 3 -- -- 2.5     T5 Nustep   Level -- -- 1 1 3    Minutes -- -- 15 15 15    METs -- -- 1.9 1.9 1.9     Track   Laps -- 21 21 21 23    Minutes -- 15 15 15 15    METs -- 2.14 2.14 2.14 2.25     Home Exercise Plan   Plans to continue exercise at -- -- -- Home (comment)  walk as tolerated, strength and stretching videos, and look into gym membership Home (comment)  walk as tolerated, strength and stretching videos, and look into gym membership   Frequency -- -- --  Add 2 additional days to program exercise sessions. Add 2 additional days to program exercise sessions.   Initial Home Exercises Provided -- -- -- 08/05/23 08/05/23     Oxygen   Maintain Oxygen Saturation -- 88% or higher 88% or higher 88% or higher 88% or higher            Exercise Comments:   Exercise Comments     Row Name 07/01/23 1627           Exercise Comments First full day of exercise!  Patient was oriented to gym and equipment including functions, settings, policies, and procedures.  Patient's individual exercise prescription and treatment plan were reviewed.  All starting workloads were established based on the results of the 6 minute walk test done at initial orientation visit.  The plan for exercise progression was also introduced and progression will be customized based on patient's performance and goals.                Exercise Goals and Review:   Exercise Goals     Row Name 06/19/23 1600             Exercise Goals   Increase Physical Activity Yes       Intervention Provide advice, education, support and counseling about physical activity/exercise needs.;Develop an individualized exercise prescription for aerobic and resistive training based on initial evaluation findings, risk stratification, comorbidities and participant's personal goals.       Expected Outcomes Short Term: Attend rehab on a regular basis to increase amount of physical activity.;Long Term: Add in home exercise to make exercise part of routine and to increase amount of physical activity.;Long Term: Exercising regularly at least 3-5 days a week.       Increase Strength and Stamina Yes       Intervention Provide advice, education, support and counseling about physical activity/exercise needs.;Develop an individualized exercise prescription for aerobic and resistive training based on initial evaluation findings, risk stratification, comorbidities and participant's personal goals.        Expected Outcomes Short Term: Increase workloads from initial exercise prescription for resistance, speed, and METs.;Short Term: Perform resistance training exercises routinely during rehab and add in resistance training at home;Long Term: Improve cardiorespiratory fitness, muscular endurance and strength as measured by increased METs and functional capacity ( )       Able to understand and use rate of perceived exertion (RPE) scale Yes       Intervention Provide education and explanation on how to use RPE scale       Expected Outcomes Short Term: Able to use RPE daily in rehab to express subjective intensity level;Long Term:  Able to use RPE to guide intensity level when exercising independently       Able to understand and use Dyspnea scale Yes       Intervention Provide education and explanation on how to use Dyspnea scale       Expected Outcomes Short Term: Able to use Dyspnea scale daily in rehab to express subjective sense of shortness of breath during exertion;Long Term: Able to use Dyspnea scale to guide intensity level when exercising independently       Knowledge and understanding of Target Heart Rate Range (THRR) Yes       Intervention Provide education and explanation of THRR including how the numbers were predicted and where they are located for reference       Expected Outcomes Short Term: Able to state/look up THRR;Short Term: Able to  use daily as guideline for intensity in rehab;Long Term: Able to use THRR to govern intensity when exercising independently       Able to check pulse independently Yes       Intervention Provide education and demonstration on how to check pulse in carotid and radial arteries.;Review the importance of being able to check your own pulse for safety during independent exercise       Expected Outcomes Short Term: Able to explain why pulse checking is important during independent exercise;Long Term: Able to check pulse independently and accurately        Understanding of Exercise Prescription Yes       Intervention Provide education, explanation, and written materials on patient's individual exercise prescription       Expected Outcomes Short Term: Able to explain program exercise prescription;Long Term: Able to explain home exercise prescription to exercise independently                Exercise Goals Re-Evaluation :  Exercise Goals Re-Evaluation     Row Name 07/01/23 1627 07/08/23 1120 07/17/23 1521 07/31/23 1559 08/05/23 1128     Exercise Goal Re-Evaluation   Exercise Goals Review Able to understand and use rate of perceived exertion (RPE) scale;Able to understand and use Dyspnea scale;Knowledge and understanding of Target Heart Rate Range (THRR);Understanding of Exercise Prescription Increase Physical Activity;Increase Strength and Stamina;Understanding of Exercise Prescription Increase Physical Activity;Increase Strength and Stamina;Understanding of Exercise Prescription Increase Physical Activity;Increase Strength and Stamina;Understanding of Exercise Prescription Increase Physical Activity;Able to understand and use rate of perceived exertion (RPE) scale;Increase Strength and Stamina;Able to understand and use Dyspnea scale;Knowledge and understanding of Target Heart Rate Range (THRR);Able to check pulse independently;Understanding of Exercise Prescription   Comments Reviewed RPE and dyspnea scale, THR and program prescription with pt today.  Pt voiced understanding and was given a copy of goals to take home. Jessi reports that when she walks on the track it bothers her hip. She is willing to try the TM to see if she tolerates walking better on the TM. Chanaya is doing well in rehab. She was able to complete her first 6 sessions in the program during this review period. During her first few sessions she was able to increase her level on the XR from 1 to 3. We will continue to monitor her progress in the program. Irisa continues to do well in  rehab. She continues to walk 21 laps on the track and improved to level 2 on the recumbent bike. She also continues to do well with 2 lb hand weights for resistance training. We will continue to monitor her progress in the program. Reviewed home exercise with pt today.  Pt plans to walk as her hip tolerates it, look into gym membership and do strength and stretching exercises at home for exercise.  Reviewed THR, pulse, RPE, sign and symptoms, pulse oximetery and when to call 911 or MD.  Also discussed weather considerations and indoor options.  Pt voiced understanding.   Expected Outcomes Short: Use RPE daily to regulate intensity. Long: Follow program prescription in THR. Short: try TM and see if it causes less pain in hip then walking to track. Long: become independent with exercise routine. Short: Continue to follow exercise prescription. Long: Continue exercise to improve strength and stamina. Short: Continue to push for more laps on the track. Long: Continue exercise to improve strength and stamina. Short: add 1-2 days a week of exercise at home on off days of rehab  as tolerated and look into a gym membership. Long: maintain independent exercise routine upon graduation from cardiac rehab.    Row Name 08/13/23 1042             Exercise Goal Re-Evaluation   Exercise Goals Review Increase Physical Activity;Understanding of Exercise Prescription;Increase Strength and Stamina       Comments Valyn continues to do well in rehab. She increased to 23 laps on the track and increased to level 3 on the T4 and T5 nusteps. She decreased down to level 1 on the recumbent bike and XR. We will continue to monitor her progress in the program.       Expected Outcomes Short: Try level 2 on the recumbent bike. Long: Continue exercise to improve strength and stamina.                Discharge Exercise Prescription (Final Exercise Prescription Changes):  Exercise Prescription Changes - 08/13/23 1000        Response to Exercise   Blood Pressure (Admit) 112/60    Blood Pressure (Exercise) 124/64    Blood Pressure (Exit) 122/66    Heart Rate (Admit) 89 bpm    Heart Rate (Exercise) 104 bpm    Heart Rate (Exit) 77 bpm    Rating of Perceived Exertion (Exercise) 15    Symptoms none    Duration Continue with 30 min of aerobic exercise without signs/symptoms of physical distress.    Intensity THRR unchanged      Progression   Progression Continue to progress workloads to maintain intensity without signs/symptoms of physical distress.    Average METs 2.36      Resistance Training   Training Prescription Yes    Weight 2 lb    Reps 10-15      Interval Training   Interval Training No      Recumbant Bike   Level 1    Watts 16    Minutes 15    METs 2.69      NuStep   Level 3    Minutes 15    METs 2.5      REL-XR   Level 1    Minutes 15    METs 2.5      T5 Nustep   Level 3    Minutes 15    METs 1.9      Track   Laps 23    Minutes 15    METs 2.25      Home Exercise Plan   Plans to continue exercise at Home (comment)   walk as tolerated, strength and stretching videos, and look into gym membership   Frequency Add 2 additional days to program exercise sessions.    Initial Home Exercises Provided 08/05/23      Oxygen   Maintain Oxygen Saturation 88% or higher             Nutrition:  Target Goals: Understanding of nutrition guidelines, daily intake of sodium 1500mg , cholesterol 200mg , calories 30% from fat and 7% or less from saturated fats, daily to have 5 or more servings of fruits and vegetables.  Education: All About Nutrition: -Group instruction provided by verbal, written material, interactive activities, discussions, models, and posters to present general guidelines for heart healthy nutrition including fat, fiber, MyPlate, the role of sodium in heart healthy nutrition, utilization of the nutrition label, and utilization of this knowledge for meal planning.  Follow up email sent as well. Written material given at graduation. Flowsheet Row Cardiac  Rehab from 07/03/2023 in [facility] Cardiac and Pulmonary Rehab  Date 07/03/23  Educator JG  Instruction Review Code 1- Verbalizes Understanding       Biometrics:  Pre Biometrics - 06/19/23 1601       Pre Biometrics   Height 5' 1.2" (1.554 m)    Weight 156 lb 1.6 oz (70.8 kg)    Waist Circumference 36.5 inches    Hip Circumference 38.2 inches    Waist to Hip Ratio 0.96 %    BMI (Calculated) 29.32    Single Leg Stand 7 seconds              Nutrition Therapy Plan and Nutrition Goals:  Nutrition Therapy & Goals - 06/19/23 1602       Personal Nutrition Goals   Nutrition Goal Will meet with RD in the next few weeks      Intervention Plan   Intervention Prescribe, educate and counsel regarding individualized specific dietary modifications aiming towards targeted core components such as weight, hypertension, lipid management, diabetes, heart failure and other comorbidities.;Nutrition handout(s) given to patient.    Expected Outcomes Short Term Goal: Understand basic principles of dietary content, such as calories, fat, sodium, cholesterol and nutrients.;Short Term Goal: A plan has been developed with personal nutrition goals set during dietitian appointment.;Long Term Goal: Adherence to prescribed nutrition plan.             Nutrition Assessments:  MEDIFICTS Score Key: >=70 Need to make dietary changes  40-70 Heart Healthy Diet <= 40 Therapeutic Level Cholesterol Diet  Flowsheet Row Cardiac Rehab from 06/19/2023 in [facility] Cardiac and Pulmonary Rehab  Picture Your Plate Total Score on Admission 53      Picture Your Plate Scores: <02 Unhealthy dietary pattern with much room for improvement. 41-50 Dietary pattern unlikely to meet recommendations for good health and room for improvement. 51-60 More healthful dietary pattern, with some room for improvement.  >60 Healthy dietary pattern,  although there may be some specific behaviors that could be improved.    Nutrition Goals Re-Evaluation:  Nutrition Goals Re-Evaluation     Row Name 07/08/23 1116 07/31/23 1120           Goals   Nutrition Goal Decided to defer RD apt for now. Will let us  know if she changes her mind. Deferred RD appointment               Nutrition Goals Discharge (Final Nutrition Goals Re-Evaluation):  Nutrition Goals Re-Evaluation - 07/31/23 1120       Goals   Nutrition Goal Deferred RD appointment             Psychosocial: Target Goals: Acknowledge presence or absence of significant depression and/or stress, maximize coping skills, provide positive support system. Participant is able to verbalize types and ability to use techniques and skills needed for reducing stress and depression.   Education: Stress, Anxiety, and Depression - Group verbal and visual presentation to define topics covered.  Reviews how body is impacted by stress, anxiety, and depression.  Also discusses healthy ways to reduce stress and to treat/manage anxiety and depression.  Written material given at graduation.   Education: Sleep Hygiene -Provides group verbal and written instruction about how sleep can affect your health.  Define sleep hygiene, discuss sleep cycles and impact of sleep habits. Review good sleep hygiene tips.    Initial Review & Psychosocial Screening:  Initial Psych Review & Screening - 06/17/23 0954       Initial Review  Current issues with Current Depression;History of Depression;Current Psychotropic Meds;Current Stress Concerns    Source of Stress Concerns Chronic Illness    Comments Rubiana is working on her anxiety since she has had alot of procedures and health issues.      Family Dynamics   Good Support System? Yes    Comments She can look to her children, husband and preacher for support.      Barriers   Psychosocial barriers to participate in program The patient should benefit  from training in stress management and relaxation.      Screening Interventions   Interventions Provide feedback about the scores to participant;To provide support and resources with identified psychosocial needs;Encouraged to exercise    Expected Outcomes Short Term goal: Utilizing psychosocial counselor, staff and physician to assist with identification of specific Stressors or current issues interfering with healing process. Setting desired goal for each stressor or current issue identified.;Long Term Goal: Stressors or current issues are controlled or eliminated.;Long Term goal: The participant improves quality of Life and PHQ9 Scores as seen by post scores and/or verbalization of changes;Short Term goal: Identification and review with participant of any Quality of Life or Depression concerns found by scoring the questionnaire.             Quality of Life Scores:   Quality of Life - 06/19/23 1602       Quality of Life   Select Quality of Life      Quality of Life Scores   Health/Function Pre 18.8 %    Socioeconomic Pre 21.75 %    Psych/Spiritual Pre 19.86 %    Family Pre 22.8 %    GLOBAL Pre 20.26 %            Scores of 19 and below usually indicate a poorer quality of life in these areas.  A difference of  2-3 points is a clinically meaningful difference.  A difference of 2-3 points in the total score of the Quality of Life Index has been associated with significant improvement in overall quality of life, self-image, physical symptoms, and general health in studies assessing change in quality of life.  PHQ-9: Review Flowsheet       07/31/2023 07/08/2023 06/19/2023  Depression screen PHQ 2/9  Decreased Interest 1 1 1   Down, Depressed, Hopeless 1 1 1   PHQ - 2 Score 2 2 2   Altered sleeping 3 2 2   Tired, decreased energy 2 2 2   Change in appetite 0 0 0  Feeling bad or failure about yourself  0 1 0  Trouble concentrating 0 0 0  Moving slowly or fidgety/restless 0 0 0   Suicidal thoughts 0 0 0  PHQ-9 Score 7 7 6   Difficult doing work/chores Somewhat difficult Somewhat difficult Somewhat difficult   Interpretation of Total Score  Total Score Depression Severity:  1-4 = Minimal depression, 5-9 = Mild depression, 10-14 = Moderate depression, 15-19 = Moderately severe depression, 20-27 = Severe depression   Psychosocial Evaluation and Intervention:  Psychosocial Evaluation - 06/17/23 0957       Psychosocial Evaluation & Interventions   Interventions Encouraged to exercise with the program and follow exercise prescription;Relaxation education;Stress management education    Comments Brennley is working on her anxiety since she has had alot of procedures and health issues.She can look to her children, husband and preacher for support.    Expected Outcomes Short: Attend [facility] stress management education to decrease stress. Long: Maintain exercise Post [facility] to keep stress  at a minimum.    Continue Psychosocial Services  Follow up required by staff             Psychosocial Re-Evaluation:  Psychosocial Re-Evaluation     Row Name 07/08/23 1123 07/31/23 1125           Psychosocial Re-Evaluation   Current issues with Current Depression;Current Psychotropic Meds;Current Stress Concerns;History of Depression;Current Anxiety/Panic Current Depression;Current Psychotropic Meds;Current Stress Concerns;History of Depression;Current Anxiety/Panic      Comments Patient reports that she is working with her doctor currently to adjust medicaiton to help with her anxiety and sleep patterns. She does have sleep concerns but is wokring closely with her doctor to [facility] these concerns. She states that her mental heath and stress levels are currently are about the same and will notify staff or doctor is there are any changes or new concerns. Reviewed patient health questionnaire (PHQ-9) with patient for follow up. Previously, patients score indicated signs/symptoms of  depression.  Reviewed to see if patient is improving symptom wise while in program.  Score istayed the samae and patient states that it is because she has a procedure coming up and it is worrysome..      Expected Outcomes Short: continue to work with doctor on current med combination to help with anxiety and sleep. Long: mangage mental health to remain stable. Short: Continue to attend [facility] regularly for regular exercise and social engagement. Long: Continue to improve symptoms and manage a positive mental state.      Interventions Encouraged to attend Cardiac Rehabilitation for the exercise Encouraged to attend Cardiac Rehabilitation for the exercise      Continue Psychosocial Services  Follow up required by staff Follow up required by staff               Psychosocial Discharge (Final Psychosocial Re-Evaluation):  Psychosocial Re-Evaluation - 07/31/23 1125       Psychosocial Re-Evaluation   Current issues with Current Depression;Current Psychotropic Meds;Current Stress Concerns;History of Depression;Current Anxiety/Panic    Comments Reviewed patient health questionnaire (PHQ-9) with patient for follow up. Previously, patients score indicated signs/symptoms of depression.  Reviewed to see if patient is improving symptom wise while in program.  Score istayed the samae and patient states that it is because she has a procedure coming up and it is worrysome..    Expected Outcomes Short: Continue to attend [facility] regularly for regular exercise and social engagement. Long: Continue to improve symptoms and manage a positive mental state.    Interventions Encouraged to attend Cardiac Rehabilitation for the exercise    Continue Psychosocial Services  Follow up required by staff             Vocational Rehabilitation: Provide vocational rehab assistance to qualifying candidates.   Vocational Rehab Evaluation & Intervention:   Education: Education Goals: Education  classes will be provided on a variety of topics geared toward better understanding of heart health and risk factor modification. Participant will state understanding/return demonstration of topics presented as noted by education test scores.  Learning Barriers/Preferences:  Learning Barriers/Preferences - 06/17/23 0953       Learning Barriers/Preferences   Learning Barriers None;Sight    Learning Preferences None             General Cardiac Education Topics:  AED/CPR: - Group verbal and written instruction with the use of models to demonstrate the basic use of the AED with the basic ABC's of resuscitation.   Anatomy and Cardiac Procedures: - Group verbal  and visual presentation and models provide information about basic cardiac anatomy and function. Reviews the testing methods done to diagnose heart disease and the outcomes of the test results. Describes the treatment choices: Medical Management, Angioplasty, or Coronary Bypass Surgery for treating various heart conditions including Myocardial Infarction, Angina, Valve Disease, and Cardiac Arrhythmias.  Written material given at graduation. Flowsheet Row Cardiac Rehab from 07/03/2023 in [facility] Cardiac and Pulmonary Rehab  Education need identified 06/19/23       Medication Safety: - Group verbal and visual instruction to review commonly prescribed medications for heart and lung disease. Reviews the medication, class of the drug, and side effects. Includes the steps to properly store meds and maintain the prescription regimen.  Written material given at graduation.   Intimacy: - Group verbal instruction through game format to discuss how heart and lung disease can affect sexual intimacy. Written material given at graduation..   Know Your Numbers and Heart Failure: - Group verbal and visual instruction to discuss disease risk factors for cardiac and pulmonary disease and treatment options.  Reviews associated critical values for  Overweight/Obesity, Hypertension, Cholesterol, and Diabetes.  Discusses basics of heart failure: signs/symptoms and treatments.  Introduces Heart Failure Zone chart for action plan for heart failure.  Written material given at graduation.   Infection Prevention: - Provides verbal and written material to individual with discussion of infection control including proper hand washing and proper equipment cleaning during exercise session. Flowsheet Row Cardiac Rehab from 07/03/2023 in [facility] Cardiac and Pulmonary Rehab  Date 06/19/23  Educator MB  Instruction Review Code 1- Verbalizes Understanding       Falls Prevention: - Provides verbal and written material to individual with discussion of falls prevention and safety. Flowsheet Row Cardiac Rehab from 07/03/2023 in [facility] Cardiac and Pulmonary Rehab  Date 06/19/23  Educator MB  Instruction Review Code 1- Verbalizes Understanding       Other: -Provides group and verbal instruction on various topics (see comments)   Knowledge Questionnaire Score:  Knowledge Questionnaire Score - 06/19/23 1603       Knowledge Questionnaire Score   Pre Score 23/26             Core Components/Risk Factors/Patient Goals at Admission:  Personal Goals and Risk Factors at Admission - 06/19/23 1603       Core Components/Risk Factors/Patient Goals on Admission    Weight Management Yes;Weight Loss    Intervention Weight Management: Develop a combined nutrition and exercise program designed to reach desired caloric intake, while maintaining appropriate intake of nutrient and fiber, sodium and fats, and appropriate energy expenditure required for the weight goal.;Weight Management: Provide education and appropriate resources to help participant work on and attain dietary goals.;Weight Management/Obesity: Establish reasonable short term and long term weight goals.    Admit Weight 156 lb 1.6 oz (70.8 kg)    Goal Weight: Short Term 148 lb 8 oz (67.4 kg)     Goal Weight: Long Term 141 lb (64 kg)    Expected Outcomes Short Term: Continue to assess and modify interventions until short term weight is achieved;Weight Loss: Understanding of general recommendations for a balanced deficit meal plan, which promotes 1-2 lb weight loss per week and includes a negative energy balance of [phone] kcal/d;Understanding recommendations for meals to include 15-35% energy as protein, 25-35% energy from fat, 35-60% energy from carbohydrates, less than 200mg  of dietary cholesterol, 20-35 gm of total fiber daily;Understanding of distribution of calorie intake throughout the day with the consumption  of 4-5 meals/snacks;Long Term: Adherence to nutrition and physical activity/exercise program aimed toward attainment of established weight goal    Hypertension Yes    Intervention Provide education on lifestyle modifcations including regular physical activity/exercise, weight management, moderate sodium restriction and increased consumption of fresh fruit, vegetables, and low fat dairy, alcohol moderation, and smoking cessation.;Monitor prescription use compliance.    Expected Outcomes Long Term: Maintenance of blood pressure at goal levels.;Short Term: Continued assessment and intervention until BP is < 140/58mm HG in hypertensive participants. < 130/53mm HG in hypertensive participants with diabetes, heart failure or chronic kidney disease.    Lipids Yes    Intervention Provide education and support for participant on nutrition & aerobic/resistive exercise along with prescribed medications to achieve LDL 70mg , HDL >40mg .    Expected Outcomes Short Term: Participant states understanding of desired cholesterol values and is compliant with medications prescribed. Participant is following exercise prescription and nutrition guidelines.;Long Term: Cholesterol controlled with medications as prescribed, with individualized exercise RX and with personalized nutrition plan. Value goals: LDL <  70mg , HDL > 40 mg.             Education:Diabetes - Individual verbal and written instruction to review signs/symptoms of diabetes, desired ranges of glucose level fasting, after meals and with exercise. Acknowledge that pre and post exercise glucose checks will be done for 3 sessions at entry of program.   Core Components/Risk Factors/Patient Goals Review:   Goals and Risk Factor Review     Row Name 07/08/23 1117 07/31/23 1121           Core Components/Risk Factors/Patient Goals Review   Personal Goals Review Hypertension;Lipids Other      Review Shalan reports that she does monitor her blood pressure most days of the week at home. She also reports that she is taking all her cholesterol meds and is not having any problems currently with her medicaiton. She follows up with her doctors regularly for required tests and lab work to monitor cardiac risk factors. Caitlynd is having a stent/repair on May 5th. She is not sure when she is able to return to rehab but is an outpatient procedure. Priscila knows to inform us  when she is able to return.      Expected Outcomes Short: continue to check BP at home and monitor for any changes. Long: continue to manage cardiac risk factors with lifestyle choices. Short: have surgery. Long: retrun to rehab post surgery.               Core Components/Risk Factors/Patient Goals at Discharge (Final Review):   Goals and Risk Factor Review - 07/31/23 1121       Core Components/Risk Factors/Patient Goals Review   Personal Goals Review Other    Review Sharne is having a stent/repair on May 5th. She is not sure when she is able to return to rehab but is an outpatient procedure. Shaghayegh knows to inform us  when she is able to return.    Expected Outcomes Short: have surgery. Long: retrun to rehab post surgery.             ITP Comments:  ITP Comments     Row Name 06/17/23 0951 06/19/23 1541 07/01/23 1626 07/17/23 0949 08/05/23 1137   ITP Comments Virtual  Visit completed. Patient informed on EP and RD appointment and 6 Minute walk test. Patient also informed of patient health questionnaires on My Chart. Patient Verbalizes understanding. Visit diagnosis can be found in [facility] 03/12/2023. Completed and  gym orientation. Initial ITP created and sent for review to Dr. Eddy Goodell, Medical Director. First full day of exercise!  Patient was oriented to gym and equipment including functions, settings, policies, and procedures.  Patient's individual exercise prescription and treatment plan were reviewed.  All starting workloads were established based on the results of the 6 minute walk test done at initial orientation visit.  The plan for exercise progression was also introduced and progression will be customized based on patient's performance and goals. 30 Day review completed. Medical Director ITP review done, changes made as directed, and signed approval by Medical Director.    new to program Marrissa will be out due to a medical procedure on 08/12/23. She will need medical clearance to return following her procedure.    Row Name 08/14/23 1135           ITP Comments 30 Day review completed. Medical Director ITP review done, changes made as directed, and signed approval by Medical Director.                Comments: 30 day review

## 2023-08-14 NOTE — Progress Notes (Signed)
 30 Day review completed. Medical Director ITP review done, changes made as directed, and signed approval by Medical Director. ? ?

## 2023-08-16 ENCOUNTER — Encounter

## 2023-08-19 ENCOUNTER — Encounter: Admitting: *Deleted

## 2023-08-19 DIAGNOSIS — Z951 Presence of aortocoronary bypass graft: Secondary | ICD-10-CM | POA: Diagnosis not present

## 2023-08-19 NOTE — Progress Notes (Signed)
 Daily Session Note  Patient Details  Name: Kaylee Pope MRN: [id number] Date of Birth: Aug 30, 1955 Referring Provider:   Flowsheet Row Cardiac Rehab from 06/19/2023 in [facility] Cardiac and Pulmonary Rehab  Referring Provider End, Veryl Gottron, MD       Encounter Date: 08/19/2023  Check In:  Session Check In - 08/19/23 1118       Check-In   Supervising physician immediately available to respond to emergencies See telemetry face sheet for immediately available ER MD    Location [facility]-Cardiac & Pulmonary Rehab    Staff Present Maud Sorenson, RN, BSN, CCRP;Joseph Hood RCP,RRT,BSRT;Maxon [employer], Exercise Physiologist;Kelly [employer], ACSM CEP, Exercise Physiologist;Margaret Best, MS, Exercise Physiologist    Virtual Visit No    Medication changes reported     No    Fall or balance concerns reported    No    Warm-up and Cool-down Performed on first and last piece of equipment    Resistance Training Performed Yes    VAD Patient? No    PAD/SET Patient? No      Pain Assessment   Currently in Pain? No/denies                Social History   Tobacco Use  Smoking Status Former   Current packs/day: 0.25   Average packs/day: 0.3 packs/day for 50.0 years (12.5 ttl pk-yrs)   Types: Cigarettes   Passive exposure: Current  Smokeless Tobacco Never  Tobacco Comments   Stopped smoking 4/46/2024    Goals Met:  Independence with exercise equipment Exercise tolerated well No report of concerns or symptoms today  Goals Unmet:  Not Applicable  Comments: Pt able to follow exercise prescription today without complaint.  Will continue to monitor for progression.    Dr. Firman Hughes is Medical Director for [facility] Cardiac Rehabilitation.  Dr. Fuad Aleskerov is Medical Director for [facility] Pulmonary Rehabilitation.

## 2023-08-21 ENCOUNTER — Encounter: Admitting: *Deleted

## 2023-08-21 DIAGNOSIS — Z951 Presence of aortocoronary bypass graft: Secondary | ICD-10-CM

## 2023-08-21 NOTE — Progress Notes (Signed)
 Daily Session Note  Patient Details  Name: Kaylee Pope MRN: [id number] Date of Birth: May 23, 1955 Referring Provider:   Flowsheet Row Cardiac Rehab from 06/19/2023 in [facility] Cardiac and Pulmonary Rehab  Referring Provider End, Veryl Gottron, MD       Encounter Date: 08/21/2023  Check In:  Session Check In - 08/21/23 1112       Check-In   Supervising physician immediately available to respond to emergencies See telemetry face sheet for immediately available ER MD    Location [facility]-Cardiac & Pulmonary Rehab    Staff Present Maud Sorenson, RN, BSN, CCRP;Joseph Hood RCP,RRT,BSRT;Maxon [place] BS, Exercise Physiologist;Noah Tickle, BS, Exercise Physiologist    Virtual Visit No    Medication changes reported     No    Fall or balance concerns reported    No    Warm-up and Cool-down Performed on first and last piece of equipment    Resistance Training Performed Yes    VAD Patient? No    PAD/SET Patient? No      Pain Assessment   Currently in Pain? No/denies                Social History   Tobacco Use  Smoking Status Former   Current packs/day: 0.25   Average packs/day: 0.3 packs/day for 50.0 years (12.5 ttl pk-yrs)   Types: Cigarettes   Passive exposure: Current  Smokeless Tobacco Never  Tobacco Comments   Stopped smoking 4/46/2024    Goals Met:  Independence with exercise equipment Exercise tolerated well No report of concerns or symptoms today  Goals Unmet:  Not Applicable  Comments: Pt able to follow exercise prescription today without complaint.  Will continue to monitor for progression.    Dr. Firman Hughes is Medical Director for [facility] Cardiac Rehabilitation.  Dr. Fuad Aleskerov is Medical Director for [facility] Pulmonary Rehabilitation.

## 2023-08-23 ENCOUNTER — Encounter: Admitting: *Deleted

## 2023-08-23 DIAGNOSIS — Z951 Presence of aortocoronary bypass graft: Secondary | ICD-10-CM

## 2023-08-23 NOTE — Progress Notes (Signed)
 Daily Session Note  Patient Details  Name: Kaylee Pope MRN: [id number] Date of Birth: 17-Jul-1955 Referring Provider:   Flowsheet Row Cardiac Rehab from 06/19/2023 in [facility] Cardiac and Pulmonary Rehab  Referring Provider End, Veryl Gottron, MD       Encounter Date: 08/23/2023  Check In:  Session Check In - 08/23/23 1109       Check-In   Supervising physician immediately available to respond to emergencies See telemetry face sheet for immediately available ER MD    Location [facility]-Cardiac & Pulmonary Rehab    Staff Present Maud Sorenson, RN, BSN, CCRP;Joseph Hood RCP,RRT,BSRT;Maxon [place] BS, Exercise Physiologist;Noah Tickle, BS, Exercise Physiologist    Virtual Visit No    Medication changes reported     No    Fall or balance concerns reported    No    Warm-up and Cool-down Performed on first and last piece of equipment    Resistance Training Performed Yes    VAD Patient? No    PAD/SET Patient? No      Pain Assessment   Currently in Pain? No/denies                Social History   Tobacco Use  Smoking Status Former   Current packs/day: 0.25   Average packs/day: 0.3 packs/day for 50.0 years (12.5 ttl pk-yrs)   Types: Cigarettes   Passive exposure: Current  Smokeless Tobacco Never  Tobacco Comments   Stopped smoking 4/46/2024    Goals Met:  Independence with exercise equipment Exercise tolerated well No report of concerns or symptoms today  Goals Unmet:  Not Applicable  Comments: Pt able to follow exercise prescription today without complaint.  Will continue to monitor for progression.    Dr. Firman Hughes is Medical Director for [facility] Cardiac Rehabilitation.  Dr. Fuad Aleskerov is Medical Director for [facility] Pulmonary Rehabilitation.

## 2023-08-26 ENCOUNTER — Encounter: Admitting: *Deleted

## 2023-08-26 DIAGNOSIS — Z951 Presence of aortocoronary bypass graft: Secondary | ICD-10-CM | POA: Diagnosis not present

## 2023-08-26 NOTE — Progress Notes (Signed)
 Daily Session Note  Patient Details  Name: Kaylee Pope MRN: [id number] Date of Birth: 1956-03-20 Referring Provider:   Flowsheet Row Cardiac Rehab from 06/19/2023 in [facility] Cardiac and Pulmonary Rehab  Referring Provider End, Veryl Gottron, MD       Encounter Date: 08/26/2023  Check In:  Session Check In - 08/26/23 1117       Check-In   Supervising physician immediately available to respond to emergencies See telemetry face sheet for immediately available ER MD    Location [facility]-Cardiac & Pulmonary Rehab    Staff Present Maud Sorenson, RN, BSN, CCRP;Maxon [place] BS, Exercise Physiologist;Kelly [employer], ACSM CEP, Exercise Physiologist    Virtual Visit No    Medication changes reported     No    Fall or balance concerns reported    No    Warm-up and Cool-down Performed on first and last piece of equipment    Resistance Training Performed Yes    VAD Patient? No    PAD/SET Patient? No      Pain Assessment   Currently in Pain? No/denies                Social History   Tobacco Use  Smoking Status Former   Current packs/day: 0.25   Average packs/day: 0.3 packs/day for 50.0 years (12.5 ttl pk-yrs)   Types: Cigarettes   Passive exposure: Current  Smokeless Tobacco Never  Tobacco Comments   Stopped smoking 4/46/2024    Goals Met:  Independence with exercise equipment Exercise tolerated well No report of concerns or symptoms today  Goals Unmet:  Not Applicable  Comments: Pt able to follow exercise prescription today without complaint.  Will continue to monitor for progression.    Dr. Firman Hughes is Medical Director for [facility] Cardiac Rehabilitation.  Dr. Fuad Aleskerov is Medical Director for [facility] Pulmonary Rehabilitation.

## 2023-08-28 ENCOUNTER — Encounter: Admitting: *Deleted

## 2023-08-28 DIAGNOSIS — Z951 Presence of aortocoronary bypass graft: Secondary | ICD-10-CM

## 2023-08-28 NOTE — Progress Notes (Signed)
 Daily Session Note  Patient Details  Name: Kaylee Pope MRN: [id number] Date of Birth: 03-26-1956 Referring Provider:   Flowsheet Row Cardiac Rehab from 06/19/2023 in [facility] Cardiac and Pulmonary Rehab  Referring Provider End, Veryl Gottron, MD       Encounter Date: 08/28/2023  Check In:  Session Check In - 08/28/23 1453       Check-In   Supervising physician immediately available to respond to emergencies See telemetry face sheet for immediately available ER MD    Location [facility]-Cardiac & Pulmonary Rehab    Staff Present Maud Sorenson, RN, BSN, CCRP;Maxon Conetta BS, Exercise Physiologist;Noah Tickle, BS, Exercise Physiologist;Jason Martina Sledge RDN,LDN    Virtual Visit No    Medication changes reported     No    Warm-up and Cool-down Performed on first and last piece of equipment    Resistance Training Performed Yes    VAD Patient? No    PAD/SET Patient? No      Pain Assessment   Currently in Pain? No/denies                Social History   Tobacco Use  Smoking Status Former   Current packs/day: 0.25   Average packs/day: 0.3 packs/day for 50.0 years (12.5 ttl pk-yrs)   Types: Cigarettes   Passive exposure: Current  Smokeless Tobacco Never  Tobacco Comments   Stopped smoking 4/46/2024    Goals Met:  Independence with exercise equipment Exercise tolerated well No report of concerns or symptoms today  Goals Unmet:  Not Applicable  Comments: Pt able to follow exercise prescription today without complaint.  Will continue to monitor for progression.    Dr. Firman Hughes is Medical Director for [facility] Cardiac Rehabilitation.  Dr. Fuad Aleskerov is Medical Director for [facility] Pulmonary Rehabilitation.

## 2023-08-30 ENCOUNTER — Encounter: Admitting: *Deleted

## 2023-08-30 DIAGNOSIS — Z951 Presence of aortocoronary bypass graft: Secondary | ICD-10-CM

## 2023-08-30 NOTE — Progress Notes (Signed)
 Daily Session Note  Patient Details  Name: Kaylee Pope MRN: [id number] Date of Birth: 10-19-1955 Referring Provider:   Flowsheet Row Cardiac Rehab from 06/19/2023 in [facility] Cardiac and Pulmonary Rehab  Referring Provider End, Veryl Gottron, MD       Encounter Date: 08/30/2023  Check In:  Session Check In - 08/30/23 1110       Check-In   Supervising physician immediately available to respond to emergencies See telemetry face sheet for immediately available ER MD    Location [facility]-Cardiac & Pulmonary Rehab    Staff Present Maud Sorenson, RN, BSN, CCRP;Joseph Hood RCP,RRT,BSRT;Maxon [place] BS, Exercise Physiologist;Noah Tickle, BS, Exercise Physiologist    Virtual Visit No    Medication changes reported     No    Fall or balance concerns reported    No    Warm-up and Cool-down Performed on first and last piece of equipment    Resistance Training Performed Yes    VAD Patient? No    PAD/SET Patient? No      Pain Assessment   Currently in Pain? No/denies                Social History   Tobacco Use  Smoking Status Former   Current packs/day: 0.25   Average packs/day: 0.3 packs/day for 50.0 years (12.5 ttl pk-yrs)   Types: Cigarettes   Passive exposure: Current  Smokeless Tobacco Never  Tobacco Comments   Stopped smoking 4/46/2024    Goals Met:  Independence with exercise equipment Exercise tolerated well No report of concerns or symptoms today  Goals Unmet:  Not Applicable  Comments: Pt able to follow exercise prescription today without complaint.  Will continue to monitor for progression.    Dr. Firman Hughes is Medical Director for [facility] Cardiac Rehabilitation.  Dr. Fuad Aleskerov is Medical Director for [facility] Pulmonary Rehabilitation.

## 2023-08-31 ENCOUNTER — Other Ambulatory Visit (HOSPITAL_COMMUNITY): Payer: Self-pay

## 2023-09-04 ENCOUNTER — Encounter: Admitting: *Deleted

## 2023-09-04 DIAGNOSIS — Z951 Presence of aortocoronary bypass graft: Secondary | ICD-10-CM | POA: Diagnosis not present

## 2023-09-04 NOTE — Progress Notes (Signed)
 Daily Session Note  Patient Details  Name: Kaylee Pope MRN: [id number] Date of Birth: 11-09-1955 Referring Provider:   Flowsheet Row Cardiac Rehab from 06/19/2023 in [facility] Cardiac and Pulmonary Rehab  Referring Provider End, Veryl Gottron, MD       Encounter Date: 09/04/2023  Check In:  Session Check In - 09/04/23 1115       Check-In   Supervising physician immediately available to respond to emergencies See telemetry face sheet for immediately available ER MD    Location [facility]-Cardiac & Pulmonary Rehab    Staff Present Maud Sorenson, RN, BSN, CCRP;Joseph Hood RCP,RRT,BSRT;Maxon [place] BS, Exercise Physiologist;Noah Tickle, BS, Exercise Physiologist    Virtual Visit No    Medication changes reported     No    Fall or balance concerns reported    No    Warm-up and Cool-down Performed on first and last piece of equipment    Resistance Training Performed Yes    VAD Patient? No    PAD/SET Patient? No      Pain Assessment   Currently in Pain? No/denies                Social History   Tobacco Use  Smoking Status Former   Current packs/day: 0.25   Average packs/day: 0.3 packs/day for 50.0 years (12.5 ttl pk-yrs)   Types: Cigarettes   Passive exposure: Current  Smokeless Tobacco Never  Tobacco Comments   Stopped smoking 4/46/2024    Goals Met:  Independence with exercise equipment Exercise tolerated well No report of concerns or symptoms today  Goals Unmet:  Not Applicable  Comments: Pt able to follow exercise prescription today without complaint.  Will continue to monitor for progression.    Dr. Firman Hughes is Medical Director for [facility] Cardiac Rehabilitation.  Dr. Fuad Aleskerov is Medical Director for [facility] Pulmonary Rehabilitation.

## 2023-09-05 ENCOUNTER — Other Ambulatory Visit (HOSPITAL_COMMUNITY): Payer: Self-pay

## 2023-09-06 ENCOUNTER — Encounter: Admitting: *Deleted

## 2023-09-06 DIAGNOSIS — Z951 Presence of aortocoronary bypass graft: Secondary | ICD-10-CM

## 2023-09-06 NOTE — Progress Notes (Signed)
 Daily Session Note  Patient Details  Name: Kaylee Pope MRN: [id number] Date of Birth: January 24, 1956 Referring Provider:   Flowsheet Row Cardiac Rehab from 06/19/2023 in [facility] Cardiac and Pulmonary Rehab  Referring Provider End, Veryl Gottron, MD       Encounter Date: 09/06/2023  Check In:  Session Check In - 09/06/23 1115       Check-In   Supervising physician immediately available to respond to emergencies See telemetry face sheet for immediately available ER MD    Location [facility]-Cardiac & Pulmonary Rehab    Staff Present Maud Sorenson, RN, BSN, CCRP;Joseph Hood RCP,RRT,BSRT;Maxon [place] BS, Exercise Physiologist;Noah Tickle, BS, Exercise Physiologist    Virtual Visit No    Medication changes reported     No    Fall or balance concerns reported    No    Warm-up and Cool-down Performed on first and last piece of equipment    Resistance Training Performed Yes    VAD Patient? No    PAD/SET Patient? No      Pain Assessment   Currently in Pain? No/denies                Social History   Tobacco Use  Smoking Status Former   Current packs/day: 0.25   Average packs/day: 0.3 packs/day for 50.0 years (12.5 ttl pk-yrs)   Types: Cigarettes   Passive exposure: Current  Smokeless Tobacco Never  Tobacco Comments   Stopped smoking 4/46/2024    Goals Met:  Independence with exercise equipment Exercise tolerated well No report of concerns or symptoms today  Goals Unmet:  Not Applicable  Comments: Pt able to follow exercise prescription today without complaint.  Will continue to monitor for progression.    Dr. Firman Hughes is Medical Director for [facility] Cardiac Rehabilitation.  Dr. Fuad Aleskerov is Medical Director for [facility] Pulmonary Rehabilitation.

## 2023-09-09 ENCOUNTER — Encounter: Attending: Internal Medicine

## 2023-09-09 DIAGNOSIS — Z951 Presence of aortocoronary bypass graft: Secondary | ICD-10-CM | POA: Insufficient documentation

## 2023-09-09 DIAGNOSIS — Z48812 Encounter for surgical aftercare following surgery on the circulatory system: Secondary | ICD-10-CM | POA: Insufficient documentation

## 2023-09-09 NOTE — Progress Notes (Signed)
 Daily Session Note  Patient Details  Name: Kaylee Pope MRN: [id number] Date of Birth: 01-Dec-1955 Referring Provider:   Flowsheet Row Cardiac Rehab from 06/19/2023 in [facility] Cardiac and Pulmonary Rehab  Referring Provider End, Veryl Gottron, MD       Encounter Date: 09/09/2023  Check In:  Session Check In - 09/09/23 1059       Check-In   Supervising physician immediately available to respond to emergencies See telemetry face sheet for immediately available ER MD    Location [facility]-Cardiac & Pulmonary Rehab    Staff Present Lyell Samuel, MS, Exercise Physiologist;Kayshawn Ozburn Dawne Euler, ACSM CEP, Exercise Physiologist;Dali Kraner RN,BSN,MPA;Joseph Hood RCP,RRT,BSRT    Virtual Visit No    Medication changes reported     No    Fall or balance concerns reported    No    Warm-up and Cool-down Performed on first and last piece of equipment    Resistance Training Performed Yes    VAD Patient? No    PAD/SET Patient? No      Pain Assessment   Currently in Pain? No/denies                Social History   Tobacco Use  Smoking Status Former   Current packs/day: 0.25   Average packs/day: 0.3 packs/day for 50.0 years (12.5 ttl pk-yrs)   Types: Cigarettes   Passive exposure: Current  Smokeless Tobacco Never  Tobacco Comments   Stopped smoking 4/46/2024    Goals Met:  Independence with exercise equipment Exercise tolerated well No report of concerns or symptoms today Strength training completed today  Goals Unmet:  Not Applicable  Comments: Pt able to follow exercise prescription today without complaint.  Will continue to monitor for progression.    Dr. Firman Hughes is Medical Director for [facility] Cardiac Rehabilitation.  Dr. Fuad Aleskerov is Medical Director for [facility] Pulmonary Rehabilitation.

## 2023-09-11 ENCOUNTER — Encounter

## 2023-09-11 ENCOUNTER — Encounter: Payer: Self-pay | Admitting: *Deleted

## 2023-09-11 DIAGNOSIS — Z951 Presence of aortocoronary bypass graft: Secondary | ICD-10-CM

## 2023-09-11 DIAGNOSIS — Z48812 Encounter for surgical aftercare following surgery on the circulatory system: Secondary | ICD-10-CM | POA: Diagnosis not present

## 2023-09-11 NOTE — Progress Notes (Signed)
 Daily Session Note  Patient Details  Name: Kaylee Pope MRN: [id number] Date of Birth: Dec 05, 1955 Referring Provider:   Flowsheet Row Cardiac Rehab from 06/19/2023 in [facility] Cardiac and Pulmonary Rehab  Referring Provider End, Veryl Gottron, MD       Encounter Date: 09/11/2023  Check In:  Session Check In - 09/11/23 1055       Check-In   Supervising physician immediately available to respond to emergencies See telemetry face sheet for immediately available ER MD    Location [facility]-Cardiac & Pulmonary Rehab    Staff Present Sherle Dire, BS, Exercise Physiologist;Lakeidra Reliford RN,BSN,MPA;Joseph Hood RCP,RRT,BSRT;Jason Martina Sledge RDN,LDN    Virtual Visit No    Medication changes reported     No    Fall or balance concerns reported    No    Warm-up and Cool-down Performed on first and last piece of equipment    Resistance Training Performed Yes    VAD Patient? No    PAD/SET Patient? No      Pain Assessment   Currently in Pain? No/denies                Social History   Tobacco Use  Smoking Status Former   Current packs/day: 0.25   Average packs/day: 0.3 packs/day for 50.0 years (12.5 ttl pk-yrs)   Types: Cigarettes   Passive exposure: Current  Smokeless Tobacco Never  Tobacco Comments   Stopped smoking 4/46/2024    Goals Met:  Independence with exercise equipment Exercise tolerated well No report of concerns or symptoms today Strength training completed today  Goals Unmet:  Not Applicable  Comments: Pt able to follow exercise prescription today without complaint.  Will continue to monitor for progression.    Dr. Firman Hughes is Medical Director for [facility] Cardiac Rehabilitation.  Dr. Fuad Aleskerov is Medical Director for [facility] Pulmonary Rehabilitation.

## 2023-09-11 NOTE — Progress Notes (Signed)
 Cardiac Individual Treatment Plan  Patient Details  Name: Kaylee Pope MRN: [id number] Date of Birth: [birth year] Referring Provider:   Flowsheet Row Cardiac Rehab from 06/19/2023 in [facility] Cardiac and Pulmonary Rehab  Referring Provider End, Veryl Gottron, MD       Initial Encounter Date:  Flowsheet Row Cardiac Rehab from 06/19/2023 in [facility] Cardiac and Pulmonary Rehab  Date 06/19/23       Visit Diagnosis: S/P CABG x 4  Patient's Home Medications on Admission:  Current Outpatient Medications:    acetaminophen  (TYLENOL ) 500 MG tablet, Take 1,000 mg by mouth every 6 (six) hours as needed for moderate pain., Disp: , Rfl:    ALPRAZolam (XANAX) 0.5 MG tablet, Take 0.5 mg by mouth daily as needed. PRN, Disp: , Rfl:    amLODipine  (NORVASC ) 10 MG tablet, Take 1 tablet (10 mg total) by mouth daily. (Patient taking differently: Take 10 mg by mouth daily. Patient states that she is taking 5 mg daily), Disp: 90 tablet, Rfl: 3   amLODipine  (NORVASC ) 5 MG tablet, Take 1 tablet (5 mg total) by mouth daily., Disp: 90 tablet, Rfl: 3   aspirin  EC 81 MG tablet, Take 81 mg by mouth daily. Swallow whole., Disp: , Rfl:    busPIRone  (BUSPAR ) 5 MG tablet, Take 1 tablet (5 mg total) by mouth 2 (two) times daily (Patient not taking: Reported on 07/15/2023), Disp: 60 tablet, Rfl: 11   busPIRone  (BUSPAR ) 7.5 MG tablet, Take 1 tablet (7.5 mg total) by mouth 2 (two) times daily as needed., Disp: 60 tablet, Rfl: 0   carvedilol  (COREG ) 12.5 MG tablet, Take 1 tablet (12.5 mg total) by mouth 2 (two) times daily., Disp: 180 tablet, Rfl: 3   carvedilol  (COREG ) 12.5 MG tablet, Take 1 tablet (12.5 mg total) by mouth 2 (two) times daily., Disp: 60 tablet, Rfl: 0   clopidogrel  (PLAVIX ) 75 MG tablet, Take 1 tablet (75 mg total) by mouth daily., Disp: 30 tablet, Rfl: 0   furosemide  (LASIX ) 20 MG tablet, Take 1 tablet (20 mg total) by mouth daily as needed (shortness of breath). (Patient taking differently: Take 20 mg by mouth  daily.), Disp: 90 tablet, Rfl: 3   levothyroxine  (SYNTHROID ) 75 MCG tablet, Take 1 tablet (75 mcg total) by mouth on empty stomach with a glass of water at least 30-60 minutes before breakfast., Disp: 90 tablet, Rfl: 3   rosuvastatin  (CRESTOR ) 20 MG tablet, Take 1 tablet (20 mg total) by mouth daily., Disp: 90 tablet, Rfl: 3   traZODone  (DESYREL ) 50 MG tablet, Take 1 tablet (50 mg total) by mouth at bedtime, Disp: 90 tablet, Rfl: 3   venlafaxine  XR (EFFEXOR -XR) 150 MG 24 hr capsule, Take 1 capsule (150 mg total) by mouth daily., Disp: 90 capsule, Rfl: 3  Past Medical History: Past Medical History:  Diagnosis Date   Allergy    CAD (coronary artery disease)    a. s/p 4-V CABG (LIMA-LAD, VG-diag, VG-OM, VG-PDA) 07/2022   Hyperlipidemia    Hypertension    Thoracoabdominal aortic aneurysm (HCC)    Thyroid  disease     Tobacco Use: Social History   Tobacco Use  Smoking Status Former   Current packs/day: 0.25   Average packs/day: 0.3 packs/day for 50.0 years (12.5 ttl pk-yrs)   Types: Cigarettes   Passive exposure: Current  Smokeless Tobacco Never  Tobacco Comments   Stopped smoking 4/46/2024    Labs: Review Flowsheet  More data exists      Latest Ref Rng & Units 10/20/2014  07/26/2022 07/27/2022 07/28/2022 09/21/2022  Labs for ITP Cardiac and Pulmonary Rehab  Cholestrol 0 - 200 mg/dL 161  - - - 096   LDL (calc) 0 - 99 mg/dL - - - - 56   Direct LDL 0 - 99 mg/dL 045.4  - - - 64   HDL-C >40 mg/dL 09.81  - - - 52   Trlycerides <150 mg/dL 191.4  - - - 782   Hemoglobin A1c 4.8 - 5.6 % - - 5.9  - -  PH, Arterial 7.35 - 7.45 - 7.43  7.295  7.278  7.421  7.355  7.371  7.267  7.302  7.288  -  PCO2 arterial 32 - 48 mmHg - 47  54.6  53.2  38.5  43.1  48.3  52.1  50.6  56.6  -  Bicarbonate 20.0 - 28.0 mmol/L - 31.9  26.7  25.2  25.0  24.0  28.0  23.7  25.0  27.2  -  TCO2 22 - 32 mmol/L - - 28  27  28  26  27  28  25  31  31  29  25  27  29   -  Acid-base deficit 0.0 - 2.0 mmol/L - - 2.0  1.0  3.0   2.0  -  O2 Saturation % - 93.2  97  98  100  100  100  95  98  97  -    Details       Multiple values from one day are sorted in reverse-chronological order          Exercise Target Goals: Exercise Program Goal: Individual exercise prescription set using results from initial 6 min walk test and THRR while considering  patient's activity barriers and safety.   Exercise Prescription Goal: Initial exercise prescription builds to 30-45 minutes a day of aerobic activity, 2-3 days per week.  Home exercise guidelines will be given to patient during program as part of exercise prescription that the participant will acknowledge.   Education: Aerobic Exercise: - Group verbal and visual presentation on the components of exercise prescription. Introduces F.I.T.T principle from ACSM for exercise prescriptions.  Reviews F.I.T.T. principles of aerobic exercise including progression. Written material given at graduation. Flowsheet Row Cardiac Rehab from 07/03/2023 in [facility] Cardiac and Pulmonary Rehab  Education need identified 06/19/23       Education: Resistance Exercise: - Group verbal and visual presentation on the components of exercise prescription. Introduces F.I.T.T principle from ACSM for exercise prescriptions  Reviews F.I.T.T. principles of resistance exercise including progression. Written material given at graduation.    Education: Exercise & Equipment Safety: - Individual verbal instruction and demonstration of equipment use and safety with use of the equipment. Flowsheet Row Cardiac Rehab from 07/03/2023 in [facility] Cardiac and Pulmonary Rehab  Date 06/19/23  Educator MB  Instruction Review Code 1- Verbalizes Understanding       Education: Exercise Physiology & General Exercise Guidelines: - Group verbal and written instruction with models to review the exercise physiology of the cardiovascular system and associated critical values. Provides general exercise guidelines with  specific guidelines to those with heart or lung disease.    Education: Flexibility, Balance, Mind/Body Relaxation: - Group verbal and visual presentation with interactive activity on the components of exercise prescription. Introduces F.I.T.T principle from ACSM for exercise prescriptions. Reviews F.I.T.T. principles of flexibility and balance exercise training including progression. Also discusses the mind body connection.  Reviews various relaxation techniques to help reduce and manage stress (i.e.  Deep breathing, progressive muscle relaxation, and visualization). Balance handout provided to take home. Written material given at graduation.   Activity Barriers & Risk Stratification:  Activity Barriers & Cardiac Risk Stratification - 06/19/23 1550       Activity Barriers & Cardiac Risk Stratification   Activity Barriers Shortness of Breath;Other (comment)    Comments Tightness in chest, not sure if incisional pain/scar tissue or angina.    Cardiac Risk Stratification High             6 Minute Walk:  6 Minute Walk     Row Name 06/19/23 1548         6 Minute Walk   Phase Initial     Distance 995 feet     Walk Time 5.77 minutes     # of Rest Breaks 1     MPH 1.96     METS 2.42     RPE 15     Perceived Dyspnea  1     VO2 Peak 8.47     Symptoms Yes (comment)     Comments Bilateral hip pain (8/10)     Resting HR 68 bpm     Resting BP 136/70     Resting Oxygen Saturation  97 %     Exercise Oxygen Saturation  during 6 min walk 94 %     Max Ex. HR 100 bpm     Max Ex. BP 158/80     2 Minute Post BP 150/80              Oxygen Initial Assessment:   Oxygen Re-Evaluation:   Oxygen Discharge (Final Oxygen Re-Evaluation):   Initial Exercise Prescription:  Initial Exercise Prescription - 06/19/23 1500       Date of Initial Exercise RX and Referring Provider   Date 06/19/23    Referring Provider End, Veryl Gottron, MD      Oxygen   Maintain Oxygen Saturation 88%  or higher      Recumbant Bike   Level 2    RPM 80    Watts 25    Minutes 15    METs 2.42      NuStep   Level 2    SPM 80    Minutes 15    METs 2.42      REL-XR   Level 1    Speed 50    Minutes 15    METs 2.42      T5 Nustep   Level 2    SPM 80    Minutes 15    METs 2.42      Biostep-RELP   Level 2    SPM 50    Minutes 15    METs 2.42      Track   Laps 26    Minutes 15    METs 2.42      Prescription Details   Frequency (times per week) 3    Duration Progress to 30 minutes of continuous aerobic without signs/symptoms of physical distress      Intensity   THRR 40-80% of Max Heartrate 101-134    Ratings of Perceived Exertion 11-13    Perceived Dyspnea 0-4      Progression   Progression Continue to progress workloads to maintain intensity without signs/symptoms of physical distress.      Resistance Training   Training Prescription Yes    Weight 2 lb    Reps 10-15  Perform Capillary Blood Glucose checks as needed.  Exercise Prescription Changes:   Exercise Prescription Changes     Row Name 06/19/23 1500 07/17/23 1500 07/31/23 1500 08/05/23 1100 08/13/23 1000     Response to Exercise   Blood Pressure (Admit) 136/70 112/64 122/80 -- 112/60   Blood Pressure (Exercise) 158/80 140/64 130/72 -- 124/64   Blood Pressure (Exit) 130/80 122/62 112/64 -- 122/66   Heart Rate (Admit) 68 bpm 72 bpm 71 bpm -- 89 bpm   Heart Rate (Exercise) 100 bpm 101 bpm 104 bpm -- 104 bpm   Heart Rate (Exit) 71 bpm 82 bpm 78 bpm -- 77 bpm   Oxygen Saturation (Admit) 97 % -- -- -- --   Oxygen Saturation (Exercise) 94 % -- -- -- --   Oxygen Saturation (Exit) 96 % -- -- -- --   Rating of Perceived Exertion (Exercise) 15 15 13  -- 15   Perceived Dyspnea (Exercise) 1 0 -- -- --   Symptoms Bilateral hip pain 8/10 none none -- none   Comments results first 2 weeks of exercise -- -- --   Duration Progress to 30 minutes of  aerobic without signs/symptoms of  physical distress Progress to 30 minutes of  aerobic without signs/symptoms of physical distress Continue with 30 min of aerobic exercise without signs/symptoms of physical distress. Continue with 30 min of aerobic exercise without signs/symptoms of physical distress. Continue with 30 min of aerobic exercise without signs/symptoms of physical distress.   Intensity THRR New THRR unchanged THRR unchanged THRR unchanged THRR unchanged     Progression   Progression Continue to progress workloads to maintain intensity without signs/symptoms of physical distress. Continue to progress workloads to maintain intensity without signs/symptoms of physical distress. Continue to progress workloads to maintain intensity without signs/symptoms of physical distress. Continue to progress workloads to maintain intensity without signs/symptoms of physical distress. Continue to progress workloads to maintain intensity without signs/symptoms of physical distress.   Average METs 2.42 2.3 2.81 2.81 2.36     Resistance Training   Training Prescription -- Yes Yes Yes Yes   Weight -- 2 2 lb 2 lb 2 lb   Reps -- 10-15 10-15 10-15 10-15     Interval Training   Interval Training -- No No No No     Recumbant Bike   Level -- 1 2 2 1    Watts -- 25 20 20 16    Minutes -- 15 15 15 15    METs -- 3.09 3.08 3.08 2.69     NuStep   Level -- -- 1 1 3    Minutes -- -- 15 15 15    METs -- -- 2.4 2.4 2.5     REL-XR   Level -- 3 -- -- 1   Minutes -- 15 -- -- 15   METs -- 3 -- -- 2.5     T5 Nustep   Level -- -- 1 1 3    Minutes -- -- 15 15 15    METs -- -- 1.9 1.9 1.9     Track   Laps -- 21 21 21 23    Minutes -- 15 15 15 15    METs -- 2.14 2.14 2.14 2.25     Home Exercise Plan   Plans to continue exercise at -- -- -- Home (comment)  walk as tolerated, strength and stretching videos, and look into gym membership Home (comment)  walk as tolerated, strength and stretching videos, and look into gym membership   Frequency -- -- --  Add 2 additional days to program exercise sessions. Add 2 additional days to program exercise sessions.   Initial Home Exercises Provided -- -- -- 08/05/23 08/05/23     Oxygen   Maintain Oxygen Saturation -- 88% or higher 88% or higher 88% or higher 88% or higher    Row Name 08/29/23 1500             Response to Exercise   Blood Pressure (Admit) 116/62       Blood Pressure (Exit) 120/68       Heart Rate (Admit) 94 bpm       Heart Rate (Exercise) 106 bpm       Heart Rate (Exit) 76 bpm       Rating of Perceived Exertion (Exercise) 15       Symptoms none       Duration Continue with 30 min of aerobic exercise without signs/symptoms of physical distress.       Intensity THRR unchanged         Progression   Progression Continue to progress workloads to maintain intensity without signs/symptoms of physical distress.       Average METs 2.26         Resistance Training   Training Prescription Yes       Weight 2 lb       Reps 10-15         Interval Training   Interval Training No         Recumbant Bike   Level 1       Watts 16       Minutes 15       METs 2.69         NuStep   Level 2       Minutes 15       METs 2.6         REL-XR   Level 1       Minutes 15       METs 2.1         T5 Nustep   Level 1       Minutes 15       METs 1.9         Track   Laps 25       Minutes 15       METs 2.36         Home Exercise Plan   Plans to continue exercise at Home (comment)  walk as tolerated, strength and stretching videos, and look into gym membership       Frequency Add 2 additional days to program exercise sessions.       Initial Home Exercises Provided 08/05/23         Oxygen   Maintain Oxygen Saturation 88% or higher                Exercise Comments:   Exercise Comments     Row Name 07/01/23 1627           Exercise Comments First full day of exercise!  Patient was oriented to gym and equipment including functions, settings, policies, and procedures.   Patient's individual exercise prescription and treatment plan were reviewed.  All starting workloads were established based on the results of the 6 minute walk test done at initial orientation visit.  The plan for exercise progression was also introduced and progression will be customized based on patient's performance and goals.  Exercise Goals and Review:   Exercise Goals     Row Name 06/19/23 1600             Exercise Goals   Increase Physical Activity Yes       Intervention Provide advice, education, support and counseling about physical activity/exercise needs.;Develop an individualized exercise prescription for aerobic and resistive training based on initial evaluation findings, risk stratification, comorbidities and participant's personal goals.       Expected Outcomes Short Term: Attend rehab on a regular basis to increase amount of physical activity.;Long Term: Add in home exercise to make exercise part of routine and to increase amount of physical activity.;Long Term: Exercising regularly at least 3-5 days a week.       Increase Strength and Stamina Yes       Intervention Provide advice, education, support and counseling about physical activity/exercise needs.;Develop an individualized exercise prescription for aerobic and resistive training based on initial evaluation findings, risk stratification, comorbidities and participant's personal goals.       Expected Outcomes Short Term: Increase workloads from initial exercise prescription for resistance, speed, and METs.;Short Term: Perform resistance training exercises routinely during rehab and add in resistance training at home;Long Term: Improve cardiorespiratory fitness, muscular endurance and strength as measured by increased METs and functional capacity ( )       Able to understand and use rate of perceived exertion (RPE) scale Yes       Intervention Provide education and explanation on how to use RPE scale        Expected Outcomes Short Term: Able to use RPE daily in rehab to express subjective intensity level;Long Term:  Able to use RPE to guide intensity level when exercising independently       Able to understand and use Dyspnea scale Yes       Intervention Provide education and explanation on how to use Dyspnea scale       Expected Outcomes Short Term: Able to use Dyspnea scale daily in rehab to express subjective sense of shortness of breath during exertion;Long Term: Able to use Dyspnea scale to guide intensity level when exercising independently       Knowledge and understanding of Target Heart Rate Range (THRR) Yes       Intervention Provide education and explanation of THRR including how the numbers were predicted and where they are located for reference       Expected Outcomes Short Term: Able to state/look up THRR;Short Term: Able to use daily as guideline for intensity in rehab;Long Term: Able to use THRR to govern intensity when exercising independently       Able to check pulse independently Yes       Intervention Provide education and demonstration on how to check pulse in carotid and radial arteries.;Review the importance of being able to check your own pulse for safety during independent exercise       Expected Outcomes Short Term: Able to explain why pulse checking is important during independent exercise;Long Term: Able to check pulse independently and accurately       Understanding of Exercise Prescription Yes       Intervention Provide education, explanation, and written materials on patient's individual exercise prescription       Expected Outcomes Short Term: Able to explain program exercise prescription;Long Term: Able to explain home exercise prescription to exercise independently                Exercise Goals Re-Evaluation :  Exercise  Goals Re-Evaluation     Row Name 07/01/23 1627 07/08/23 1120 07/17/23 1521 07/31/23 1559 08/05/23 1128     Exercise Goal Re-Evaluation    Exercise Goals Review Able to understand and use rate of perceived exertion (RPE) scale;Able to understand and use Dyspnea scale;Knowledge and understanding of Target Heart Rate Range (THRR);Understanding of Exercise Prescription Increase Physical Activity;Increase Strength and Stamina;Understanding of Exercise Prescription Increase Physical Activity;Increase Strength and Stamina;Understanding of Exercise Prescription Increase Physical Activity;Increase Strength and Stamina;Understanding of Exercise Prescription Increase Physical Activity;Able to understand and use rate of perceived exertion (RPE) scale;Increase Strength and Stamina;Able to understand and use Dyspnea scale;Knowledge and understanding of Target Heart Rate Range (THRR);Able to check pulse independently;Understanding of Exercise Prescription   Comments Reviewed RPE and dyspnea scale, THR and program prescription with pt today.  Pt voiced understanding and was given a copy of goals to take home. Skylarr reports that when she walks on the track it bothers her hip. She is willing to try the TM to see if she tolerates walking better on the TM. Abia is doing well in rehab. She was able to complete her first 6 sessions in the program during this review period. During her first few sessions she was able to increase her level on the XR from 1 to 3. We will continue to monitor her progress in the program. Barbette continues to do well in rehab. She continues to walk 21 laps on the track and improved to level 2 on the recumbent bike. She also continues to do well with 2 lb hand weights for resistance training. We will continue to monitor her progress in the program. Reviewed home exercise with pt today.  Pt plans to walk as her hip tolerates it, look into gym membership and do strength and stretching exercises at home for exercise.  Reviewed THR, pulse, RPE, sign and symptoms, pulse oximetery and when to call 911 or MD.  Also discussed weather considerations and  indoor options.  Pt voiced understanding.   Expected Outcomes Short: Use RPE daily to regulate intensity. Long: Follow program prescription in THR. Short: try TM and see if it causes less pain in hip then walking to track. Long: become independent with exercise routine. Short: Continue to follow exercise prescription. Long: Continue exercise to improve strength and stamina. Short: Continue to push for more laps on the track. Long: Continue exercise to improve strength and stamina. Short: add 1-2 days a week of exercise at home on off days of rehab as tolerated and look into a gym membership. Long: maintain independent exercise routine upon graduation from cardiac rehab.    Row Name 08/13/23 1042 08/29/23 1545 08/30/23 1128         Exercise Goal Re-Evaluation   Exercise Goals Review Increase Physical Activity;Understanding of Exercise Prescription;Increase Strength and Stamina Increase Physical Activity;Understanding of Exercise Prescription;Increase Strength and Stamina Increase Physical Activity;Understanding of Exercise Prescription;Increase Strength and Stamina     Comments Irais continues to do well in rehab. She increased to 23 laps on the track and increased to level 3 on the T4 and T5 nusteps. She decreased down to level 1 on the recumbent bike and XR. We will continue to monitor her progress in the program. Nysia is doing well in rehab. She increased her walking distance to 25 laps on the track. She also continues to work at level 1 on the XR and recumbent bike. We will continue to monitor her progress in the program. Carreen is doing well with exercise  overall. She states that she still has some trouble walking due to her hip pain. She has not begun exercising on days away from rehab. She does have space to walk at home. We encouraged her to begin walking on days away from rehab. We will continue to monitor her progress in the program.     Expected Outcomes Short: Try level 2 on the recumbent bike.  Long: Continue exercise to improve strength and stamina. Short: Increase to level 2 on the recumbent bike. Long: Continue exercise to improve strength and stamina. Short: Begin walking on days away from rehab. Long: Continue exercise to improve strength and stamina.              Discharge Exercise Prescription (Final Exercise Prescription Changes):  Exercise Prescription Changes - 08/29/23 1500       Response to Exercise   Blood Pressure (Admit) 116/62    Blood Pressure (Exit) 120/68    Heart Rate (Admit) 94 bpm    Heart Rate (Exercise) 106 bpm    Heart Rate (Exit) 76 bpm    Rating of Perceived Exertion (Exercise) 15    Symptoms none    Duration Continue with 30 min of aerobic exercise without signs/symptoms of physical distress.    Intensity THRR unchanged      Progression   Progression Continue to progress workloads to maintain intensity without signs/symptoms of physical distress.    Average METs 2.26      Resistance Training   Training Prescription Yes    Weight 2 lb    Reps 10-15      Interval Training   Interval Training No      Recumbant Bike   Level 1    Watts 16    Minutes 15    METs 2.69      NuStep   Level 2    Minutes 15    METs 2.6      REL-XR   Level 1    Minutes 15    METs 2.1      T5 Nustep   Level 1    Minutes 15    METs 1.9      Track   Laps 25    Minutes 15    METs 2.36      Home Exercise Plan   Plans to continue exercise at Home (comment)   walk as tolerated, strength and stretching videos, and look into gym membership   Frequency Add 2 additional days to program exercise sessions.    Initial Home Exercises Provided 08/05/23      Oxygen   Maintain Oxygen Saturation 88% or higher             Nutrition:  Target Goals: Understanding of nutrition guidelines, daily intake of sodium 1500mg , cholesterol 200mg , calories 30% from fat and 7% or less from saturated fats, daily to have 5 or more servings of fruits and  vegetables.  Education: All About Nutrition: -Group instruction provided by verbal, written material, interactive activities, discussions, models, and posters to present general guidelines for heart healthy nutrition including fat, fiber, MyPlate, the role of sodium in heart healthy nutrition, utilization of the nutrition label, and utilization of this knowledge for meal planning. Follow up email sent as well. Written material given at graduation. Flowsheet Row Cardiac Rehab from 07/03/2023 in [facility] Cardiac and Pulmonary Rehab  Date 07/03/23  Educator JG  Instruction Review Code 1- Verbalizes Understanding       Biometrics:  Pre Biometrics -  06/19/23 1601       Pre Biometrics   Height 5' 1.2" (1.554 m)    Weight 156 lb 1.6 oz (70.8 kg)    Waist Circumference 36.5 inches    Hip Circumference 38.2 inches    Waist to Hip Ratio 0.96 %    BMI (Calculated) 29.32    Single Leg Stand 7 seconds              Nutrition Therapy Plan and Nutrition Goals:  Nutrition Therapy & Goals - 06/19/23 1602       Personal Nutrition Goals   Nutrition Goal Will meet with RD in the next few weeks      Intervention Plan   Intervention Prescribe, educate and counsel regarding individualized specific dietary modifications aiming towards targeted core components such as weight, hypertension, lipid management, diabetes, heart failure and other comorbidities.;Nutrition handout(s) given to patient.    Expected Outcomes Short Term Goal: Understand basic principles of dietary content, such as calories, fat, sodium, cholesterol and nutrients.;Short Term Goal: A plan has been developed with personal nutrition goals set during dietitian appointment.;Long Term Goal: Adherence to prescribed nutrition plan.             Nutrition Assessments:  MEDIFICTS Score Key: >=70 Need to make dietary changes  40-70 Heart Healthy Diet <= 40 Therapeutic Level Cholesterol Diet  Flowsheet Row Cardiac Rehab from  06/19/2023 in [facility] Cardiac and Pulmonary Rehab  Picture Your Plate Total Score on Admission 53      Picture Your Plate Scores: <16 Unhealthy dietary pattern with much room for improvement. 41-50 Dietary pattern unlikely to meet recommendations for good health and room for improvement. 51-60 More healthful dietary pattern, with some room for improvement.  >60 Healthy dietary pattern, although there may be some specific behaviors that could be improved.    Nutrition Goals Re-Evaluation:  Nutrition Goals Re-Evaluation     Row Name 07/08/23 1116 07/31/23 1120 08/30/23 1135         Goals   Nutrition Goal Decided to defer RD apt for now. Will let us  know if she changes her mind. Deferred RD appointment Deferred RD appointment              Nutrition Goals Discharge (Final Nutrition Goals Re-Evaluation):  Nutrition Goals Re-Evaluation - 08/30/23 1135       Goals   Nutrition Goal Deferred RD appointment             Psychosocial: Target Goals: Acknowledge presence or absence of significant depression and/or stress, maximize coping skills, provide positive support system. Participant is able to verbalize types and ability to use techniques and skills needed for reducing stress and depression.   Education: Stress, Anxiety, and Depression - Group verbal and visual presentation to define topics covered.  Reviews how body is impacted by stress, anxiety, and depression.  Also discusses healthy ways to reduce stress and to treat/manage anxiety and depression.  Written material given at graduation.   Education: Sleep Hygiene -Provides group verbal and written instruction about how sleep can affect your health.  Define sleep hygiene, discuss sleep cycles and impact of sleep habits. Review good sleep hygiene tips.    Initial Review & Psychosocial Screening:  Initial Psych Review & Screening - 06/17/23 0954       Initial Review   Current issues with Current Depression;History of  Depression;Current Psychotropic Meds;Current Stress Concerns    Source of Stress Concerns Chronic Illness    Comments Brice is working on  her anxiety since she has had alot of procedures and health issues.      Family Dynamics   Good Support System? Yes    Comments She can look to her children, husband and preacher for support.      Barriers   Psychosocial barriers to participate in program The patient should benefit from training in stress management and relaxation.      Screening Interventions   Interventions Provide feedback about the scores to participant;To provide support and resources with identified psychosocial needs;Encouraged to exercise    Expected Outcomes Short Term goal: Utilizing psychosocial counselor, staff and physician to assist with identification of specific Stressors or current issues interfering with healing process. Setting desired goal for each stressor or current issue identified.;Long Term Goal: Stressors or current issues are controlled or eliminated.;Long Term goal: The participant improves quality of Life and PHQ9 Scores as seen by post scores and/or verbalization of changes;Short Term goal: Identification and review with participant of any Quality of Life or Depression concerns found by scoring the questionnaire.             Quality of Life Scores:   Quality of Life - 06/19/23 1602       Quality of Life   Select Quality of Life      Quality of Life Scores   Health/Function Pre 18.8 %    Socioeconomic Pre 21.75 %    Psych/Spiritual Pre 19.86 %    Family Pre 22.8 %    GLOBAL Pre 20.26 %            Scores of 19 and below usually indicate a poorer quality of life in these areas.  A difference of  2-3 points is a clinically meaningful difference.  A difference of 2-3 points in the total score of the Quality of Life Index has been associated with significant improvement in overall quality of life, self-image, physical symptoms, and general health in  studies assessing change in quality of life.  PHQ-9: Review Flowsheet       08/30/2023 07/31/2023 07/08/2023 06/19/2023  Depression screen PHQ 2/9  Decreased Interest 1 1 1 1   Down, Depressed, Hopeless 1 1 1 1   PHQ - 2 Score 2 2 2 2   Altered sleeping 3 3 2 2   Tired, decreased energy 2 2 2 2   Change in appetite 0 0 0 0  Feeling bad or failure about yourself  0 0 1 0  Trouble concentrating 0 0 0 0  Moving slowly or fidgety/restless 0 0 0 0  Suicidal thoughts 0 0 0 0  PHQ-9 Score 7 7 7 6   Difficult doing work/chores Somewhat difficult Somewhat difficult Somewhat difficult Somewhat difficult   Interpretation of Total Score  Total Score Depression Severity:  1-4 = Minimal depression, 5-9 = Mild depression, 10-14 = Moderate depression, 15-19 = Moderately severe depression, 20-27 = Severe depression   Psychosocial Evaluation and Intervention:  Psychosocial Evaluation - 06/17/23 0957       Psychosocial Evaluation & Interventions   Interventions Encouraged to exercise with the program and follow exercise prescription;Relaxation education;Stress management education    Comments Kahlia is working on her anxiety since she has had alot of procedures and health issues.She can look to her children, husband and preacher for support.    Expected Outcomes Short: Attend [facility] stress management education to decrease stress. Long: Maintain exercise Post [facility] to keep stress at a minimum.    Continue Psychosocial Services  Follow up required by staff  Psychosocial Re-Evaluation:  Psychosocial Re-Evaluation     Row Name 07/08/23 1123 07/31/23 1125 08/30/23 1130         Psychosocial Re-Evaluation   Current issues with Current Depression;Current Psychotropic Meds;Current Stress Concerns;History of Depression;Current Anxiety/Panic Current Depression;Current Psychotropic Meds;Current Stress Concerns;History of Depression;Current Anxiety/Panic Current Depression;Current  Psychotropic Meds;Current Stress Concerns;History of Depression;Current Anxiety/Panic;Current Sleep Concerns     Comments Patient reports that she is working with her doctor currently to adjust medicaiton to help with her anxiety and sleep patterns. She does have sleep concerns but is wokring closely with her doctor to [facility] these concerns. She states that her mental heath and stress levels are currently are about the same and will notify staff or doctor is there are any changes or new concerns. Reviewed patient health questionnaire (PHQ-9) with patient for follow up. Previously, patients score indicated signs/symptoms of depression.  Reviewed to see if patient is improving symptom wise while in program.  Score istayed the samae and patient states that it is because she has a procedure coming up and it is worrysome.. Reviewed patient health questionnaire (PHQ-9) with patient for follow up. Previously, patients score indicated signs/symptoms of depression.  Reviewed to see if patient is improving symptom wise while in program.  Her scores have not changed since the last time we reviewed the questionnaire. She states that she is turning to prayer and enjoys reading for stress relief. She is still having trouble sleeping.     Expected Outcomes Short: continue to work with doctor on current med combination to help with anxiety and sleep. Long: mangage mental health to remain stable. Short: Continue to attend [facility] regularly for regular exercise and social engagement. Long: Continue to improve symptoms and manage a positive mental state. Short: Continue to attend [facility] regularly for regular exercise and social engagement. Long: Continue to improve symptoms and manage a positive mental state.     Interventions Encouraged to attend Cardiac Rehabilitation for the exercise Encouraged to attend Cardiac Rehabilitation for the exercise Encouraged to attend Cardiac Rehabilitation for the exercise      Continue Psychosocial Services  Follow up required by staff Follow up required by staff Follow up required by staff              Psychosocial Discharge (Final Psychosocial Re-Evaluation):  Psychosocial Re-Evaluation - 08/30/23 1130       Psychosocial Re-Evaluation   Current issues with Current Depression;Current Psychotropic Meds;Current Stress Concerns;History of Depression;Current Anxiety/Panic;Current Sleep Concerns    Comments Reviewed patient health questionnaire (PHQ-9) with patient for follow up. Previously, patients score indicated signs/symptoms of depression.  Reviewed to see if patient is improving symptom wise while in program.  Her scores have not changed since the last time we reviewed the questionnaire. She states that she is turning to prayer and enjoys reading for stress relief. She is still having trouble sleeping.    Expected Outcomes Short: Continue to attend [facility] regularly for regular exercise and social engagement. Long: Continue to improve symptoms and manage a positive mental state.    Interventions Encouraged to attend Cardiac Rehabilitation for the exercise    Continue Psychosocial Services  Follow up required by staff             Vocational Rehabilitation: Provide vocational rehab assistance to qualifying candidates.   Vocational Rehab Evaluation & Intervention:   Education: Education Goals: Education classes will be provided on a variety of topics geared toward better understanding of heart health and risk factor modification. Participant  will state understanding/return demonstration of topics presented as noted by education test scores.  Learning Barriers/Preferences:  Learning Barriers/Preferences - 06/17/23 0953       Learning Barriers/Preferences   Learning Barriers None;Sight    Learning Preferences None             General Cardiac Education Topics:  AED/CPR: - Group verbal and written instruction with the use of models to  demonstrate the basic use of the AED with the basic ABC's of resuscitation.   Anatomy and Cardiac Procedures: - Group verbal and visual presentation and models provide information about basic cardiac anatomy and function. Reviews the testing methods done to diagnose heart disease and the outcomes of the test results. Describes the treatment choices: Medical Management, Angioplasty, or Coronary Bypass Surgery for treating various heart conditions including Myocardial Infarction, Angina, Valve Disease, and Cardiac Arrhythmias.  Written material given at graduation. Flowsheet Row Cardiac Rehab from 07/03/2023 in [facility] Cardiac and Pulmonary Rehab  Education need identified 06/19/23       Medication Safety: - Group verbal and visual instruction to review commonly prescribed medications for heart and lung disease. Reviews the medication, class of the drug, and side effects. Includes the steps to properly store meds and maintain the prescription regimen.  Written material given at graduation.   Intimacy: - Group verbal instruction through game format to discuss how heart and lung disease can affect sexual intimacy. Written material given at graduation..   Know Your Numbers and Heart Failure: - Group verbal and visual instruction to discuss disease risk factors for cardiac and pulmonary disease and treatment options.  Reviews associated critical values for Overweight/Obesity, Hypertension, Cholesterol, and Diabetes.  Discusses basics of heart failure: signs/symptoms and treatments.  Introduces Heart Failure Zone chart for action plan for heart failure.  Written material given at graduation.   Infection Prevention: - Provides verbal and written material to individual with discussion of infection control including proper hand washing and proper equipment cleaning during exercise session. Flowsheet Row Cardiac Rehab from 07/03/2023 in [facility] Cardiac and Pulmonary Rehab  Date 06/19/23  Educator MB   Instruction Review Code 1- Verbalizes Understanding       Falls Prevention: - Provides verbal and written material to individual with discussion of falls prevention and safety. Flowsheet Row Cardiac Rehab from 07/03/2023 in [facility] Cardiac and Pulmonary Rehab  Date 06/19/23  Educator MB  Instruction Review Code 1- Verbalizes Understanding       Other: -Provides group and verbal instruction on various topics (see comments)   Knowledge Questionnaire Score:  Knowledge Questionnaire Score - 06/19/23 1603       Knowledge Questionnaire Score   Pre Score 23/26             Core Components/Risk Factors/Patient Goals at Admission:  Personal Goals and Risk Factors at Admission - 06/19/23 1603       Core Components/Risk Factors/Patient Goals on Admission    Weight Management Yes;Weight Loss    Intervention Weight Management: Develop a combined nutrition and exercise program designed to reach desired caloric intake, while maintaining appropriate intake of nutrient and fiber, sodium and fats, and appropriate energy expenditure required for the weight goal.;Weight Management: Provide education and appropriate resources to help participant work on and attain dietary goals.;Weight Management/Obesity: Establish reasonable short term and long term weight goals.    Admit Weight 156 lb 1.6 oz (70.8 kg)    Goal Weight: Short Term 148 lb 8 oz (67.4 kg)    Goal Weight:  Long Term 141 lb (64 kg)    Expected Outcomes Short Term: Continue to assess and modify interventions until short term weight is achieved;Weight Loss: Understanding of general recommendations for a balanced deficit meal plan, which promotes 1-2 lb weight loss per week and includes a negative energy balance of [phone] kcal/d;Understanding recommendations for meals to include 15-35% energy as protein, 25-35% energy from fat, 35-60% energy from carbohydrates, less than 200mg  of dietary cholesterol, 20-35 gm of total fiber  daily;Understanding of distribution of calorie intake throughout the day with the consumption of 4-5 meals/snacks;Long Term: Adherence to nutrition and physical activity/exercise program aimed toward attainment of established weight goal    Hypertension Yes    Intervention Provide education on lifestyle modifcations including regular physical activity/exercise, weight management, moderate sodium restriction and increased consumption of fresh fruit, vegetables, and low fat dairy, alcohol moderation, and smoking cessation.;Monitor prescription use compliance.    Expected Outcomes Long Term: Maintenance of blood pressure at goal levels.;Short Term: Continued assessment and intervention until BP is < 140/93mm HG in hypertensive participants. < 130/64mm HG in hypertensive participants with diabetes, heart failure or chronic kidney disease.    Lipids Yes    Intervention Provide education and support for participant on nutrition & aerobic/resistive exercise along with prescribed medications to achieve LDL 70mg , HDL >40mg .    Expected Outcomes Short Term: Participant states understanding of desired cholesterol values and is compliant with medications prescribed. Participant is following exercise prescription and nutrition guidelines.;Long Term: Cholesterol controlled with medications as prescribed, with individualized exercise RX and with personalized nutrition plan. Value goals: LDL < 70mg , HDL > 40 mg.             Education:Diabetes - Individual verbal and written instruction to review signs/symptoms of diabetes, desired ranges of glucose level fasting, after meals and with exercise. Acknowledge that pre and post exercise glucose checks will be done for 3 sessions at entry of program.   Core Components/Risk Factors/Patient Goals Review:   Goals and Risk Factor Review     Row Name 07/08/23 1117 07/31/23 1121 08/30/23 1135         Core Components/Risk Factors/Patient Goals Review   Personal Goals  Review Hypertension;Lipids Other Weight Management/Obesity;Hypertension     Review Quinita reports that she does monitor her blood pressure most days of the week at home. She also reports that she is taking all her cholesterol meds and is not having any problems currently with her medicaiton. She follows up with her doctors regularly for required tests and lab work to monitor cardiac risk factors. Shanvi is having a stent/repair on May 5th. She is not sure when she is able to return to rehab but is an outpatient procedure. Clista knows to inform us  when she is able to return. Jalia states that she would like to lose some weight. She states that she has a goal weight of 135-140 lb. Her BP readings have stayed within normal ranges while attending rehab. She reports that she owns a BP cuff and checks her readings at home routinely. She also states that her BP has been good when she checks it at home. Shataya is taking all of her medications as prescribed.     Expected Outcomes Short: continue to check BP at home and monitor for any changes. Long: continue to manage cardiac risk factors with lifestyle choices. Short: have surgery. Long: retrun to rehab post surgery. Short: Continue to work towards weight goal through diet and exercise. Long: Continue to manage  lifestyle risk factors.              Core Components/Risk Factors/Patient Goals at Discharge (Final Review):   Goals and Risk Factor Review - 08/30/23 1135       Core Components/Risk Factors/Patient Goals Review   Personal Goals Review Weight Management/Obesity;Hypertension    Review Kaneesha states that she would like to lose some weight. She states that she has a goal weight of 135-140 lb. Her BP readings have stayed within normal ranges while attending rehab. She reports that she owns a BP cuff and checks her readings at home routinely. She also states that her BP has been good when she checks it at home. Viridiana is taking all of her medications as  prescribed.    Expected Outcomes Short: Continue to work towards weight goal through diet and exercise. Long: Continue to manage lifestyle risk factors.             ITP Comments:  ITP Comments     Row Name 06/17/23 0951 06/19/23 1541 07/01/23 1626 07/17/23 0949 08/05/23 1137   ITP Comments Virtual Visit completed. Patient informed on EP and RD appointment and 6 Minute walk test. Patient also informed of patient health questionnaires on My Chart. Patient Verbalizes understanding. Visit diagnosis can be found in [facility] 03/12/2023. Completed and gym orientation. Initial ITP created and sent for review to Dr. Eddy Goodell, Medical Director. First full day of exercise!  Patient was oriented to gym and equipment including functions, settings, policies, and procedures.  Patient's individual exercise prescription and treatment plan were reviewed.  All starting workloads were established based on the results of the 6 minute walk test done at initial orientation visit.  The plan for exercise progression was also introduced and progression will be customized based on patient's performance and goals. 30 Day review completed. Medical Director ITP review done, changes made as directed, and signed approval by Medical Director.    new to program Coleta will be out due to a medical procedure on 08/12/23. She will need medical clearance to return following her procedure.    Row Name 08/14/23 1135 09/11/23 1044         ITP Comments 30 Day review completed. Medical Director ITP review done, changes made as directed, and signed approval by Medical Director. 30 Day review completed. Medical Director ITP review done, changes made as directed, and signed approval by Medical Director.               Comments:

## 2023-09-13 ENCOUNTER — Encounter

## 2023-09-13 DIAGNOSIS — Z48812 Encounter for surgical aftercare following surgery on the circulatory system: Secondary | ICD-10-CM | POA: Diagnosis not present

## 2023-09-13 DIAGNOSIS — Z951 Presence of aortocoronary bypass graft: Secondary | ICD-10-CM

## 2023-09-13 NOTE — Progress Notes (Signed)
 Daily Session Note  Patient Details  Name: Kaylee Pope MRN: [id number] Date of Birth: [birth year] Referring Provider:   Flowsheet Row Cardiac Rehab from 06/19/2023 in [facility] Cardiac and Pulmonary Rehab  Referring Provider End, Veryl Gottron, MD       Encounter Date: 09/13/2023  Check In:  Session Check In - 09/13/23 1058       Check-In   Supervising physician immediately available to respond to emergencies See telemetry face sheet for immediately available ER MD    Location [facility]-Cardiac & Pulmonary Rehab    Staff Present Sherle Dire, BS, Exercise Physiologist;Maxon Conetta BS, Exercise Physiologist;Mieka Leaton RN,BSN,MPA;Jayzon Taras Sabra Cramp BS, ACSM CEP, Exercise Physiologist    Virtual Visit No    Medication changes reported     No    Fall or balance concerns reported    No    Warm-up and Cool-down Performed on first and last piece of equipment    Resistance Training Performed Yes    VAD Patient? No    PAD/SET Patient? No      Pain Assessment   Currently in Pain? No/denies                Social History   Tobacco Use  Smoking Status Former   Current packs/day: 0.25   Average packs/day: 0.3 packs/day for 50.0 years (12.5 ttl pk-yrs)   Types: Cigarettes   Passive exposure: Current  Smokeless Tobacco Never  Tobacco Comments   Stopped smoking 4/46/2024    Goals Met:  Independence with exercise equipment Exercise tolerated well No report of concerns or symptoms today Strength training completed today  Goals Unmet:  Not Applicable  Comments: Pt able to follow exercise prescription today without complaint.  Will continue to monitor for progression.    Dr. Firman Hughes is Medical Director for [facility] Cardiac Rehabilitation.  Dr. Fuad Aleskerov is Medical Director for [facility] Pulmonary Rehabilitation.

## 2023-09-14 ENCOUNTER — Other Ambulatory Visit (HOSPITAL_COMMUNITY): Payer: Self-pay

## 2023-09-16 ENCOUNTER — Encounter: Admitting: *Deleted

## 2023-09-16 DIAGNOSIS — Z951 Presence of aortocoronary bypass graft: Secondary | ICD-10-CM

## 2023-09-16 DIAGNOSIS — Z48812 Encounter for surgical aftercare following surgery on the circulatory system: Secondary | ICD-10-CM | POA: Diagnosis not present

## 2023-09-16 NOTE — Progress Notes (Signed)
 Daily Session Note  Patient Details  Name: Kaylee Pope MRN: [id number] Date of Birth: May 02, 1955 Referring Provider:   Flowsheet Row Cardiac Rehab from 06/19/2023 in [facility] Cardiac and Pulmonary Rehab  Referring Provider End, Veryl Gottron, MD       Encounter Date: 09/16/2023  Check In:  Session Check In - 09/16/23 1112       Check-In   Supervising physician immediately available to respond to emergencies See telemetry face sheet for immediately available ER MD    Location [facility]-Cardiac & Pulmonary Rehab    Staff Present Maud Sorenson, RN, BSN, CCRP;Maxon Conetta BS, Exercise Physiologist;Meredith Manson Seitz RN,BSN;Kelly Bollinger [facility]    Virtual Visit No    Medication changes reported     No    Fall or balance concerns reported    No    Warm-up and Cool-down Performed on first and last piece of equipment    Resistance Training Performed Yes    VAD Patient? No    PAD/SET Patient? No      Pain Assessment   Currently in Pain? No/denies                Social History   Tobacco Use  Smoking Status Former   Current packs/day: 0.25   Average packs/day: 0.3 packs/day for 50.0 years (12.5 ttl pk-yrs)   Types: Cigarettes   Passive exposure: Current  Smokeless Tobacco Never  Tobacco Comments   Stopped smoking 4/46/2024    Goals Met:  Independence with exercise equipment Exercise tolerated well No report of concerns or symptoms today  Goals Unmet:  Not Applicable  Comments: Pt able to follow exercise prescription today without complaint.  Will continue to monitor for progression.    Dr. Firman Hughes is Medical Director for [facility] Cardiac Rehabilitation.  Dr. Fuad Aleskerov is Medical Director for [facility] Pulmonary Rehabilitation.

## 2023-09-18 ENCOUNTER — Encounter: Admitting: *Deleted

## 2023-09-18 VITALS — Ht 61.2 in | Wt 166.9 lb

## 2023-09-18 DIAGNOSIS — Z951 Presence of aortocoronary bypass graft: Secondary | ICD-10-CM

## 2023-09-18 DIAGNOSIS — Z48812 Encounter for surgical aftercare following surgery on the circulatory system: Secondary | ICD-10-CM | POA: Diagnosis not present

## 2023-09-18 NOTE — Progress Notes (Signed)
 Daily Session Note  Patient Details  Name: Kaylee Pope MRN: [id number] Date of Birth: Nov 23, 1955 Referring Provider:   Flowsheet Row Cardiac Rehab from 06/19/2023 in [facility] Cardiac and Pulmonary Rehab  Referring Provider End, Veryl Gottron, MD       Encounter Date: 09/18/2023  Check In:  Session Check In - 09/18/23 1110       Check-In   Supervising physician immediately available to respond to emergencies See telemetry face sheet for immediately available ER MD    Location [facility]-Cardiac & Pulmonary Rehab    Staff Present Maud Sorenson, RN, BSN, CCRP;Noah Tickle, BS, Exercise Physiologist;Kelly Bollinger RN,BSN,MPA;Jason Martina Sledge RDN,LDN    Virtual Visit No    Medication changes reported     No    Fall or balance concerns reported    No    Warm-up and Cool-down Performed on first and last piece of equipment    Resistance Training Performed Yes    VAD Patient? No    PAD/SET Patient? No      Pain Assessment   Currently in Pain? No/denies                Social History   Tobacco Use  Smoking Status Former   Current packs/day: 0.25   Average packs/day: 0.3 packs/day for 50.0 years (12.5 ttl pk-yrs)   Types: Cigarettes   Passive exposure: Current  Smokeless Tobacco Never  Tobacco Comments   Stopped smoking 4/46/2024    Goals Met:  Independence with exercise equipment Exercise tolerated well No report of concerns or symptoms today  Goals Unmet:  Not Applicable  Comments: Pt able to follow exercise prescription today without complaint.  Will continue to monitor for progression.    Dr. Firman Hughes is Medical Director for [facility] Cardiac Rehabilitation.  Dr. Fuad Aleskerov is Medical Director for [facility] Pulmonary Rehabilitation.

## 2023-09-18 NOTE — Patient Instructions (Signed)
 Discharge Patient Instructions  Patient Details  Name: Kaylee Pope MRN: [id number] Date of Birth: Apr 02, 1956 Referring Provider:  Lyle San, MD   Number of Visits: 36  Reason for Discharge:  Patient reached a stable level of exercise. Patient independent in their exercise. Patient has met program and personal goals.  Diagnosis:  S/P CABG x 4  Initial Exercise Prescription:  Initial Exercise Prescription - 06/19/23 1500       Date of Initial Exercise RX and Referring Provider   Date 06/19/23    Referring Provider End, Veryl Gottron, MD      Oxygen   Maintain Oxygen Saturation 88% or higher      Recumbant Bike   Level 2    RPM 80    Watts 25    Minutes 15    METs 2.42      NuStep   Level 2    SPM 80    Minutes 15    METs 2.42      REL-XR   Level 1    Speed 50    Minutes 15    METs 2.42      T5 Nustep   Level 2    SPM 80    Minutes 15    METs 2.42      Biostep-RELP   Level 2    SPM 50    Minutes 15    METs 2.42      Track   Laps 26    Minutes 15    METs 2.42      Prescription Details   Frequency (times per week) 3    Duration Progress to 30 minutes of continuous aerobic without signs/symptoms of physical distress      Intensity   THRR 40-80% of Max Heartrate 101-134    Ratings of Perceived Exertion 11-13    Perceived Dyspnea 0-4      Progression   Progression Continue to progress workloads to maintain intensity without signs/symptoms of physical distress.      Resistance Training   Training Prescription Yes    Weight 2 lb    Reps 10-15             Discharge Exercise Prescription (Final Exercise Prescription Changes):  Exercise Prescription Changes - 09/12/23 1400       Response to Exercise   Blood Pressure (Admit) 106/56    Blood Pressure (Exit) 110/68    Heart Rate (Admit) 81 bpm    Heart Rate (Exercise) 101 bpm    Heart Rate (Exit) 77 bpm    Rating of Perceived Exertion (Exercise) 14    Symptoms none    Duration  Continue with 30 min of aerobic exercise without signs/symptoms of physical distress.    Intensity THRR unchanged      Progression   Progression Continue to progress workloads to maintain intensity without signs/symptoms of physical distress.    Average METs 3.07      Resistance Training   Training Prescription Yes    Weight 2 lb    Reps 10-15      Interval Training   Interval Training No      Recumbant Bike   Level 1    Watts 15    Minutes 15    METs 2.63      NuStep   Level 1    Minutes 15    METs 2.8      REL-XR   Level 2    Minutes 15  METs 2.8      T5 Nustep   Level 1    Minutes 15    METs 2.1      Track   Laps 25    Minutes 15    METs 2.36      Home Exercise Plan   Plans to continue exercise at Home (comment)   walk as tolerated, strength and stretching videos, and look into gym membership   Frequency Add 2 additional days to program exercise sessions.    Initial Home Exercises Provided 08/05/23      Oxygen   Maintain Oxygen Saturation 88% or higher             Functional Capacity:  6 Minute Walk     Row Name 06/19/23 1548 09/18/23 1138       6 Minute Walk   Phase Initial Discharge    Distance 995 feet 1000 feet    Distance % Change -- 0.5 %    Distance Feet Change -- 5 ft    Walk Time 5.77 minutes 5.67 minutes    # of Rest Breaks 1 1    MPH 1.96 2    METS 2.42 2.15    RPE 15 13    Perceived Dyspnea  1 1    VO2 Peak 8.47 7.53    Symptoms Yes (comment) Yes (comment)    Comments Bilateral hip pain (8/10) L hip pain 7/10    Resting HR 68 bpm 75 bpm    Resting BP 136/70 118/64    Resting Oxygen Saturation  97 % 96 %    Exercise Oxygen Saturation  during 6 min walk 94 % 87 %    Max Ex. HR 100 bpm 103 bpm    Max Ex. BP 158/80 130/66    2 Minute Post BP 150/80 --            Nutrition & Weight - Outcomes:  Pre Biometrics - 06/19/23 1601       Pre Biometrics   Height 5' 1.2 (1.554 m)    Weight 156 lb 1.6 oz (70.8 kg)     Waist Circumference 36.5 inches    Hip Circumference 38.2 inches    Waist to Hip Ratio 0.96 %    BMI (Calculated) 29.32    Single Leg Stand 7 seconds             Post Biometrics - 09/18/23 1139        Post  Biometrics   Height 5' 1.2 (1.554 m)    Weight 166 lb 14.4 oz (75.7 kg)    Waist Circumference 37 inches    Hip Circumference 39 inches    Waist to Hip Ratio 0.95 %    BMI (Calculated) 31.35    Single Leg Stand 30 seconds             Nutrition:  Nutrition Therapy & Goals - 06/19/23 1602       Personal Nutrition Goals   Nutrition Goal Will meet with RD in the next few weeks      Intervention Plan   Intervention Prescribe, educate and counsel regarding individualized specific dietary modifications aiming towards targeted core components such as weight, hypertension, lipid management, diabetes, heart failure and other comorbidities.;Nutrition handout(s) given to patient.    Expected Outcomes Short Term Goal: Understand basic principles of dietary content, such as calories, fat, sodium, cholesterol and nutrients.;Short Term Goal: A plan has been developed with personal nutrition goals set during dietitian  appointment.;Long Term Goal: Adherence to prescribed nutrition plan.

## 2023-09-20 ENCOUNTER — Encounter: Admitting: *Deleted

## 2023-09-20 DIAGNOSIS — Z951 Presence of aortocoronary bypass graft: Secondary | ICD-10-CM

## 2023-09-20 DIAGNOSIS — Z48812 Encounter for surgical aftercare following surgery on the circulatory system: Secondary | ICD-10-CM | POA: Diagnosis not present

## 2023-09-20 NOTE — Progress Notes (Signed)
 Daily Session Note  Patient Details  Name: Kaylee Pope MRN: [id number] Date of Birth: [birth year] Referring Provider:   Flowsheet Row Cardiac Rehab from 06/19/2023 in [facility] Cardiac and Pulmonary Rehab  Referring Provider End, Veryl Gottron, MD    Encounter Date: 09/20/2023  Check In:  Session Check In - 09/20/23 1122       Check-In   Supervising physician immediately available to respond to emergencies See telemetry face sheet for immediately available ER MD    Location [facility]-Cardiac & Pulmonary Rehab    Staff Present Maud Sorenson, RN, BSN, CCRP;Meredith Craven RN,BSN;[employer]on [employer], Exercise Physiologist;Noah Tickle, BS, Exercise Physiologist    Virtual Visit No    Medication changes reported     No    Fall or balance concerns reported    No    Warm-up and Cool-down Performed on first and last piece of equipment    Resistance Training Performed Yes    VAD Patient? No    PAD/SET Patient? No      Pain Assessment   Currently in Pain? No/denies             Social History   Tobacco Use  Smoking Status Former   Current packs/day: 0.25   Average packs/day: 0.3 packs/day for 50.0 years (12.5 ttl pk-yrs)   Types: Cigarettes   Passive exposure: Current  Smokeless Tobacco Never  Tobacco Comments   Stopped smoking 4/46/2024    Goals Met:  Independence with exercise equipment Exercise tolerated well No report of concerns or symptoms today  Goals Unmet:  Not Applicable  Comments: Pt able to follow exercise prescription today without complaint.  Will continue to monitor for progression.    Dr. Firman Hughes is Medical Director for [facility] Cardiac Rehabilitation.  Dr. Fuad Aleskerov is Medical Director for [facility] Pulmonary Rehabilitation.

## 2023-09-23 ENCOUNTER — Encounter

## 2023-09-23 ENCOUNTER — Other Ambulatory Visit (HOSPITAL_COMMUNITY): Payer: Self-pay

## 2023-09-23 DIAGNOSIS — E039 Hypothyroidism, unspecified: Secondary | ICD-10-CM | POA: Diagnosis not present

## 2023-09-23 DIAGNOSIS — F419 Anxiety disorder, unspecified: Secondary | ICD-10-CM | POA: Diagnosis not present

## 2023-09-23 DIAGNOSIS — F32A Depression, unspecified: Secondary | ICD-10-CM | POA: Diagnosis not present

## 2023-09-23 DIAGNOSIS — R0602 Shortness of breath: Secondary | ICD-10-CM | POA: Diagnosis not present

## 2023-09-23 DIAGNOSIS — Z0000 Encounter for general adult medical examination without abnormal findings: Secondary | ICD-10-CM | POA: Diagnosis not present

## 2023-09-23 DIAGNOSIS — I10 Essential (primary) hypertension: Secondary | ICD-10-CM | POA: Diagnosis not present

## 2023-09-23 DIAGNOSIS — E782 Mixed hyperlipidemia: Secondary | ICD-10-CM | POA: Diagnosis not present

## 2023-09-23 DIAGNOSIS — F0390 Unspecified dementia without behavioral disturbance: Secondary | ICD-10-CM | POA: Diagnosis not present

## 2023-09-23 MED ORDER — ARIPIPRAZOLE 2 MG PO TABS
2.0000 mg | ORAL_TABLET | Freq: Every day | ORAL | 1 refills | Status: DC
  Filled 2023-09-23: qty 90, 90d supply, fill #0

## 2023-09-25 ENCOUNTER — Encounter: Admitting: *Deleted

## 2023-09-25 DIAGNOSIS — Z951 Presence of aortocoronary bypass graft: Secondary | ICD-10-CM

## 2023-09-25 DIAGNOSIS — Z48812 Encounter for surgical aftercare following surgery on the circulatory system: Secondary | ICD-10-CM | POA: Diagnosis not present

## 2023-09-25 NOTE — Progress Notes (Signed)
 Daily Session Note  Patient Details  Name: Kaylee Pope MRN: [id number] Date of Birth: Jun 04, 1955 Referring Provider:   Flowsheet Row Cardiac Rehab from 06/19/2023 in [facility] Cardiac and Pulmonary Rehab  Referring Provider End, Veryl Gottron, MD    Encounter Date: 09/25/2023  Check In:  Session Check In - 09/25/23 1129       Check-In   Supervising physician immediately available to respond to emergencies See telemetry face sheet for immediately available ER MD    Location [facility]-Cardiac & Pulmonary Rehab    Staff Present Maud Sorenson, RN, BSN, CCRP;Joseph Hood RCP,RRT,BSRT;Maxon Edmund BS, Exercise Physiologist;Jason Martina Sledge RDN,LDN    Virtual Visit No    Medication changes reported     No    Fall or balance concerns reported    No    Warm-up and Cool-down Performed on first and last piece of equipment    Resistance Training Performed Yes    VAD Patient? No    PAD/SET Patient? No      Pain Assessment   Currently in Pain? No/denies             Social History   Tobacco Use  Smoking Status Former   Current packs/day: 0.25   Average packs/day: 0.3 packs/day for 50.0 years (12.5 ttl pk-yrs)   Types: Cigarettes   Passive exposure: Current  Smokeless Tobacco Never  Tobacco Comments   Stopped smoking 4/46/2024    Goals Met:  Independence with exercise equipment Exercise tolerated well No report of concerns or symptoms today  Goals Unmet:  Not Applicable  Comments: Pt able to follow exercise prescription today without complaint.  Will continue to monitor for progression.    Dr. Firman Hughes is Medical Director for [facility] Cardiac Rehabilitation.  Dr. Fuad Aleskerov is Medical Director for [facility] Pulmonary Rehabilitation.

## 2023-09-27 ENCOUNTER — Encounter: Admitting: *Deleted

## 2023-09-27 DIAGNOSIS — Z951 Presence of aortocoronary bypass graft: Secondary | ICD-10-CM

## 2023-09-27 DIAGNOSIS — Z48812 Encounter for surgical aftercare following surgery on the circulatory system: Secondary | ICD-10-CM | POA: Diagnosis not present

## 2023-09-27 NOTE — Progress Notes (Signed)
 Discharge Note for  Kaylee Pope     [birth year]        Carmelina Chinchilla graduated today from  rehab with 35 sessions completed.  Details of the patient's exercise prescription and what She needs to do in order to continue the prescription and progress were discussed with patient.  Patient was given a copy of prescription and goals.  Patient verbalized understanding. Jolena plans to continue to exercise by joining a gym.      6 Minute Walk     Row Name 06/19/23 1548 09/18/23 1138       6 Minute Walk   Phase Initial Discharge    Distance 995 feet 1000 feet    Distance % Change -- 0.5 %    Distance Feet Change -- 5 ft    Walk Time 5.77 minutes 5.67 minutes    # of Rest Breaks 1 1    MPH 1.96 2    METS 2.42 2.15    RPE 15 13    Perceived Dyspnea  1 1    VO2 Peak 8.47 7.53    Symptoms Yes (comment) Yes (comment)    Comments Bilateral hip pain (8/10) L hip pain 7/10    Resting HR 68 bpm 75 bpm    Resting BP 136/70 118/64    Resting Oxygen Saturation  97 % 96 %    Exercise Oxygen Saturation  during 6 min walk 94 % 87 %    Max Ex. HR 100 bpm 103 bpm    Max Ex. BP 158/80 130/66    2 Minute Post BP 150/80 --

## 2023-09-27 NOTE — Progress Notes (Signed)
 Daily Session Note  Patient Details  Name: Kaylee Pope MRN: [id number] Date of Birth: 07/19/1955 Referring Provider:   Flowsheet Row Cardiac Rehab from 06/19/2023 in [facility] Cardiac and Pulmonary Rehab  Referring Provider End, Veryl Gottron, MD    Encounter Date: 09/27/2023  Check In:  Session Check In - 09/27/23 1058       Check-In   Supervising physician immediately available to respond to emergencies See telemetry face sheet for immediately available ER MD    Location [facility]-Cardiac & Pulmonary Rehab    Staff Present Maud Sorenson, RN, BSN, CCRP;Joseph Hood RCP,RRT,BSRT;Maxon [place] BS, Exercise Physiologist;Noah Tickle, BS, Exercise Physiologist    Virtual Visit No    Medication changes reported     No    Fall or balance concerns reported    No    Warm-up and Cool-down Performed on first and last piece of equipment    Resistance Training Performed Yes    VAD Patient? No    PAD/SET Patient? No      Pain Assessment   Currently in Pain? No/denies             Social History   Tobacco Use  Smoking Status Former   Current packs/day: 0.25   Average packs/day: 0.3 packs/day for 50.0 years (12.5 ttl pk-yrs)   Types: Cigarettes   Passive exposure: Current  Smokeless Tobacco Never  Tobacco Comments   Stopped smoking 4/46/2024    Goals Met:  Independence with exercise equipment Exercise tolerated well No report of concerns or symptoms today  Goals Unmet:  Not Applicable  Comments: Pt able to follow exercise prescription today without complaint.    Kaylee Pope graduated today from  rehab with 35 sessions completed.  Details of the patient's exercise prescription and what She needs to do in order to continue the prescription and progress were discussed with patient.  Patient was given a copy of prescription and goals.  Patient verbalized understanding. Kaylee Pope plans to continue to exercise by joining a gym.    Dr. Firman Hughes is Medical Director for [facility] Cardiac  Rehabilitation.  Dr. Fuad Aleskerov is Medical Director for [facility] Pulmonary Rehabilitation.

## 2023-09-27 NOTE — Progress Notes (Signed)
 Cardiac Individual Treatment Plan  Patient Details  Name: Kaylee Pope MRN: [id number] Date of Birth: 1956/01/24 Referring Provider:   Flowsheet Row Cardiac Rehab from 06/19/2023 in [facility] Cardiac and Pulmonary Rehab  Referring Provider End, Veryl Gottron, MD    Initial Encounter Date:  Flowsheet Row Cardiac Rehab from 06/19/2023 in [facility] Cardiac and Pulmonary Rehab  Date 06/19/23    Visit Diagnosis: S/P CABG x 4  Patient's Home Medications on Admission:  Current Outpatient Medications:    acetaminophen  (TYLENOL ) 500 MG tablet, Take 1,000 mg by mouth every 6 (six) hours as needed for moderate pain., Disp: , Rfl:    ALPRAZolam (XANAX) 0.5 MG tablet, Take 0.5 mg by mouth daily as needed. PRN, Disp: , Rfl:    amLODipine  (NORVASC ) 10 MG tablet, Take 1 tablet (10 mg total) by mouth daily. (Patient taking differently: Take 10 mg by mouth daily. Patient states that she is taking 5 mg daily), Disp: 90 tablet, Rfl: 3   amLODipine  (NORVASC ) 5 MG tablet, Take 1 tablet (5 mg total) by mouth daily., Disp: 90 tablet, Rfl: 3   ARIPiprazole (ABILIFY) 2 MG tablet, Take 1 tablet (2 mg total) by mouth daily., Disp: 90 tablet, Rfl: 1   aspirin  EC 81 MG tablet, Take 81 mg by mouth daily. Swallow whole., Disp: , Rfl:    busPIRone  (BUSPAR ) 5 MG tablet, Take 1 tablet (5 mg total) by mouth 2 (two) times daily (Patient not taking: Reported on 07/15/2023), Disp: 60 tablet, Rfl: 11   busPIRone  (BUSPAR ) 7.5 MG tablet, Take 1 tablet (7.5 mg total) by mouth 2 (two) times daily as needed., Disp: 60 tablet, Rfl: 0   carvedilol  (COREG ) 12.5 MG tablet, Take 1 tablet (12.5 mg total) by mouth 2 (two) times daily., Disp: 180 tablet, Rfl: 3   carvedilol  (COREG ) 12.5 MG tablet, Take 1 tablet (12.5 mg total) by mouth 2 (two) times daily., Disp: 60 tablet, Rfl: 0   clopidogrel  (PLAVIX ) 75 MG tablet, Take 1 tablet (75 mg total) by mouth daily., Disp: 30 tablet, Rfl: 0   furosemide  (LASIX ) 20 MG tablet, Take 1 tablet (20 mg total) by  mouth daily as needed (shortness of breath). (Patient taking differently: Take 20 mg by mouth daily.), Disp: 90 tablet, Rfl: 3   levothyroxine  (SYNTHROID ) 75 MCG tablet, Take 1 tablet (75 mcg total) by mouth on empty stomach with a glass of water at least 30-60 minutes before breakfast., Disp: 90 tablet, Rfl: 3   rosuvastatin  (CRESTOR ) 20 MG tablet, Take 1 tablet (20 mg total) by mouth daily., Disp: 90 tablet, Rfl: 3   traZODone  (DESYREL ) 50 MG tablet, Take 1 tablet (50 mg total) by mouth at bedtime, Disp: 90 tablet, Rfl: 3   venlafaxine  XR (EFFEXOR -XR) 150 MG 24 hr capsule, Take 1 capsule (150 mg total) by mouth daily., Disp: 90 capsule, Rfl: 3  Past Medical History: Past Medical History:  Diagnosis Date   Allergy    CAD (coronary artery disease)    a. s/p 4-V CABG (LIMA-LAD, VG-diag, VG-OM, VG-PDA) 07/2022   Hyperlipidemia    Hypertension    Thoracoabdominal aortic aneurysm (HCC)    Thyroid  disease     Tobacco Use: Social History   Tobacco Use  Smoking Status Former   Current packs/day: 0.25   Average packs/day: 0.3 packs/day for 50.0 years (12.5 ttl pk-yrs)   Types: Cigarettes   Passive exposure: Current  Smokeless Tobacco Never  Tobacco Comments   Stopped smoking 4/46/2024    Labs: Review Flowsheet  More data exists      Latest Ref Rng & Units 10/20/2014 07/26/2022 07/27/2022 07/28/2022 09/21/2022  Labs for ITP Cardiac and Pulmonary Rehab  Cholestrol 0 - 200 mg/dL 161  - - - 096   LDL (calc) 0 - 99 mg/dL - - - - 56   Direct LDL 0 - 99 mg/dL 045.4  - - - 64   HDL-C >40 mg/dL 09.81  - - - 52   Trlycerides <150 mg/dL 191.4  - - - 782   Hemoglobin A1c 4.8 - 5.6 % - - 5.9  - -  PH, Arterial 7.35 - 7.45 - 7.43  7.295  7.278  7.421  7.355  7.371  7.267  7.302  7.288  -  PCO2 arterial 32 - 48 mmHg - 47  54.6  53.2  38.5  43.1  48.3  52.1  50.6  56.6  -  Bicarbonate 20.0 - 28.0 mmol/L - 31.9  26.7  25.2  25.0  24.0  28.0  23.7  25.0  27.2  -  TCO2 22 - 32 mmol/L - - 28  27  28  26   27  28  25  31  31  29  25  27  29   -  Acid-base deficit 0.0 - 2.0 mmol/L - - 2.0  1.0  3.0  2.0  -  O2 Saturation % - 93.2  97  98  100  100  100  95  98  97  -    Details       Multiple values from one day are sorted in reverse-chronological order          Exercise Target Goals: Exercise Program Goal: Individual exercise prescription set using results from initial 6 min walk test and THRR while considering  patient's activity barriers and safety.   Exercise Prescription Goal: Initial exercise prescription builds to 30-45 minutes a day of aerobic activity, 2-3 days per week.  Home exercise guidelines will be given to patient during program as part of exercise prescription that the participant will acknowledge.   Education: Aerobic Exercise: - Group verbal and visual presentation on the components of exercise prescription. Introduces F.I.T.T principle from ACSM for exercise prescriptions.  Reviews F.I.T.T. principles of aerobic exercise including progression. Written material given at graduation. Flowsheet Row Cardiac Rehab from 07/03/2023 in [facility] Cardiac and Pulmonary Rehab  Education need identified 06/19/23    Education: Resistance Exercise: - Group verbal and visual presentation on the components of exercise prescription. Introduces F.I.T.T principle from ACSM for exercise prescriptions  Reviews F.I.T.T. principles of resistance exercise including progression. Written material given at graduation.    Education: Exercise & Equipment Safety: - Individual verbal instruction and demonstration of equipment use and safety with use of the equipment. Flowsheet Row Cardiac Rehab from 07/03/2023 in [facility] Cardiac and Pulmonary Rehab  Date 06/19/23  Educator MB  Instruction Review Code 1- Verbalizes Understanding    Education: Exercise Physiology & General Exercise Guidelines: - Group verbal and written instruction with models to review the exercise physiology of the cardiovascular  system and associated critical values. Provides general exercise guidelines with specific guidelines to those with heart or lung disease.    Education: Flexibility, Balance, Mind/Body Relaxation: - Group verbal and visual presentation with interactive activity on the components of exercise prescription. Introduces F.I.T.T principle from ACSM for exercise prescriptions. Reviews F.I.T.T. principles of flexibility and balance exercise training including progression. Also discusses the mind body connection.  Reviews various relaxation  techniques to help reduce and manage stress (i.e. Deep breathing, progressive muscle relaxation, and visualization). Balance handout provided to take home. Written material given at graduation.   Activity Barriers & Risk Stratification:  Activity Barriers & Cardiac Risk Stratification - 06/19/23 1550       Activity Barriers & Cardiac Risk Stratification   Activity Barriers Shortness of Breath;Other (comment)    Comments Tightness in chest, not sure if incisional pain/scar tissue or angina.    Cardiac Risk Stratification High          6 Minute Walk:  6 Minute Walk     Row Name 06/19/23 1548 09/18/23 1138       6 Minute Walk   Phase Initial Discharge    Distance 995 feet 1000 feet    Distance % Change -- 0.5 %    Distance Feet Change -- 5 ft    Walk Time 5.77 minutes 5.67 minutes    # of Rest Breaks 1 1    MPH 1.96 2    METS 2.42 2.15    RPE 15 13    Perceived Dyspnea  1 1    VO2 Peak 8.47 7.53    Symptoms Yes (comment) Yes (comment)    Comments Bilateral hip pain (8/10) L hip pain 7/10    Resting HR 68 bpm 75 bpm    Resting BP 136/70 118/64    Resting Oxygen Saturation  97 % 96 %    Exercise Oxygen Saturation  during 6 min walk 94 % 87 %    Max Ex. HR 100 bpm 103 bpm    Max Ex. BP 158/80 130/66    2 Minute Post BP 150/80 --       Oxygen Initial Assessment:   Oxygen Re-Evaluation:   Oxygen Discharge (Final Oxygen  Re-Evaluation):   Initial Exercise Prescription:  Initial Exercise Prescription - 06/19/23 1500       Date of Initial Exercise RX and Referring Provider   Date 06/19/23    Referring Provider End, Veryl Gottron, MD      Oxygen   Maintain Oxygen Saturation 88% or higher      Recumbant Bike   Level 2    RPM 80    Watts 25    Minutes 15    METs 2.42      NuStep   Level 2    SPM 80    Minutes 15    METs 2.42      REL-XR   Level 1    Speed 50    Minutes 15    METs 2.42      T5 Nustep   Level 2    SPM 80    Minutes 15    METs 2.42      Biostep-RELP   Level 2    SPM 50    Minutes 15    METs 2.42      Track   Laps 26    Minutes 15    METs 2.42      Prescription Details   Frequency (times per week) 3    Duration Progress to 30 minutes of continuous aerobic without signs/symptoms of physical distress      Intensity   THRR 40-80% of Max Heartrate 101-134    Ratings of Perceived Exertion 11-13    Perceived Dyspnea 0-4      Progression   Progression Continue to progress workloads to maintain intensity without signs/symptoms of physical distress.  Resistance Training   Training Prescription Yes    Weight 2 lb    Reps 10-15          Perform Capillary Blood Glucose checks as needed.  Exercise Prescription Changes:   Exercise Prescription Changes     Row Name 06/19/23 1500 07/17/23 1500 07/31/23 1500 08/05/23 1100 08/13/23 1000     Response to Exercise   Blood Pressure (Admit) 136/70 112/64 122/80 -- 112/60   Blood Pressure (Exercise) 158/80 140/64 130/72 -- 124/64   Blood Pressure (Exit) 130/80 122/62 112/64 -- 122/66   Heart Rate (Admit) 68 bpm 72 bpm 71 bpm -- 89 bpm   Heart Rate (Exercise) 100 bpm 101 bpm 104 bpm -- 104 bpm   Heart Rate (Exit) 71 bpm 82 bpm 78 bpm -- 77 bpm   Oxygen Saturation (Admit) 97 % -- -- -- --   Oxygen Saturation (Exercise) 94 % -- -- -- --   Oxygen Saturation (Exit) 96 % -- -- -- --   Rating of Perceived Exertion  (Exercise) 15 15 13  -- 15   Perceived Dyspnea (Exercise) 1 0 -- -- --   Symptoms Bilateral hip pain 8/10 none none -- none   Comments results first 2 weeks of exercise -- -- --   Duration Progress to 30 minutes of  aerobic without signs/symptoms of physical distress Progress to 30 minutes of  aerobic without signs/symptoms of physical distress Continue with 30 min of aerobic exercise without signs/symptoms of physical distress. Continue with 30 min of aerobic exercise without signs/symptoms of physical distress. Continue with 30 min of aerobic exercise without signs/symptoms of physical distress.   Intensity THRR New THRR unchanged THRR unchanged THRR unchanged THRR unchanged     Progression   Progression Continue to progress workloads to maintain intensity without signs/symptoms of physical distress. Continue to progress workloads to maintain intensity without signs/symptoms of physical distress. Continue to progress workloads to maintain intensity without signs/symptoms of physical distress. Continue to progress workloads to maintain intensity without signs/symptoms of physical distress. Continue to progress workloads to maintain intensity without signs/symptoms of physical distress.   Average METs 2.42 2.3 2.81 2.81 2.36     Resistance Training   Training Prescription -- Yes Yes Yes Yes   Weight -- 2 2 lb 2 lb 2 lb   Reps -- 10-15 10-15 10-15 10-15     Interval Training   Interval Training -- No No No No     Recumbant Bike   Level -- 1 2 2 1    Watts -- 25 20 20 16    Minutes -- 15 15 15 15    METs -- 3.09 3.08 3.08 2.69     NuStep   Level -- -- 1 1 3    Minutes -- -- 15 15 15    METs -- -- 2.4 2.4 2.5     REL-XR   Level -- 3 -- -- 1   Minutes -- 15 -- -- 15   METs -- 3 -- -- 2.5     T5 Nustep   Level -- -- 1 1 3    Minutes -- -- 15 15 15    METs -- -- 1.9 1.9 1.9     Track   Laps -- 21 21 21 23    Minutes -- 15 15 15 15    METs -- 2.14 2.14 2.14 2.25     Home Exercise  Plan   Plans to continue exercise at -- -- -- Home (comment)  walk as tolerated, strength  and stretching videos, and look into gym membership Home (comment)  walk as tolerated, strength and stretching videos, and look into gym membership   Frequency -- -- -- Add 2 additional days to program exercise sessions. Add 2 additional days to program exercise sessions.   Initial Home Exercises Provided -- -- -- 08/05/23 08/05/23     Oxygen   Maintain Oxygen Saturation -- 88% or higher 88% or higher 88% or higher 88% or higher    Row Name 08/29/23 1500 09/12/23 1400 09/26/23 1600         Response to Exercise   Blood Pressure (Admit) 116/62 106/56 116/64     Blood Pressure (Exit) 120/68 110/68 104/62     Heart Rate (Admit) 94 bpm 81 bpm 91 bpm     Heart Rate (Exercise) 106 bpm 101 bpm 97 bpm     Heart Rate (Exit) 76 bpm 77 bpm 79 bpm     Oxygen Saturation (Admit) -- -- 90 %     Oxygen Saturation (Exercise) -- -- 90 %     Oxygen Saturation (Exit) -- -- 93 %     Rating of Perceived Exertion (Exercise) 15 14 14      Symptoms none none none     Duration Continue with 30 min of aerobic exercise without signs/symptoms of physical distress. Continue with 30 min of aerobic exercise without signs/symptoms of physical distress. Continue with 30 min of aerobic exercise without signs/symptoms of physical distress.     Intensity THRR unchanged THRR unchanged THRR unchanged       Progression   Progression Continue to progress workloads to maintain intensity without signs/symptoms of physical distress. Continue to progress workloads to maintain intensity without signs/symptoms of physical distress. Continue to progress workloads to maintain intensity without signs/symptoms of physical distress.     Average METs 2.26 3.07 2.37       Resistance Training   Training Prescription Yes Yes Yes     Weight 2 lb 2 lb 2 lb     Reps 10-15 10-15 10-15       Interval Training   Interval Training No No No        Recumbant Bike   Level 1 1 --     Watts 16 15 --     Minutes 15 15 --     METs 2.69 2.63 --       NuStep   Level 2 1 3      Minutes 15 15 15      METs 2.6 2.8 3.1       REL-XR   Level 1 2 1      Minutes 15 15 15      METs 2.1 2.8 3.1       T5 Nustep   Level 1 1 2      Minutes 15 15 15      METs 1.9 2.1 2       Track   Laps 25 25 25      Minutes 15 15 15      METs 2.36 2.36 2.36       Home Exercise Plan   Plans to continue exercise at Home (comment)  walk as tolerated, strength and stretching videos, and look into gym membership Home (comment)  walk as tolerated, strength and stretching videos, and look into gym membership Home (comment)  walk as tolerated, strength and stretching videos, and look into gym membership     Frequency Add 2 additional days to program exercise sessions. Add 2 additional  days to program exercise sessions. Add 2 additional days to program exercise sessions.     Initial Home Exercises Provided 08/05/23 08/05/23 08/05/23       Oxygen   Maintain Oxygen Saturation 88% or higher 88% or higher 88% or higher        Exercise Comments:   Exercise Comments     Row Name 07/01/23 1627 09/27/23 1108         Exercise Comments First full day of exercise!  Patient was oriented to gym and equipment including functions, settings, policies, and procedures.  Patient's individual exercise prescription and treatment plan were reviewed.  All starting workloads were established based on the results of the 6 minute walk test done at initial orientation visit.  The plan for exercise progression was also introduced and progression will be customized based on patient's performance and goals. Sherree graduated today from  rehab with 35 sessions completed.  Details of the patient's exercise prescription and what She needs to do in order to continue the prescription and progress were discussed with patient.  Patient was given a copy of prescription and goals.  Patient verbalized  understanding. Daniella plans to continue to exercise by joining a gym.         Exercise Goals and Review:   Exercise Goals     Row Name 06/19/23 1600             Exercise Goals   Increase Physical Activity Yes       Intervention Provide advice, education, support and counseling about physical activity/exercise needs.;Develop an individualized exercise prescription for aerobic and resistive training based on initial evaluation findings, risk stratification, comorbidities and participant's personal goals.       Expected Outcomes Short Term: Attend rehab on a regular basis to increase amount of physical activity.;Long Term: Add in home exercise to make exercise part of routine and to increase amount of physical activity.;Long Term: Exercising regularly at least 3-5 days a week.       Increase Strength and Stamina Yes       Intervention Provide advice, education, support and counseling about physical activity/exercise needs.;Develop an individualized exercise prescription for aerobic and resistive training based on initial evaluation findings, risk stratification, comorbidities and participant's personal goals.       Expected Outcomes Short Term: Increase workloads from initial exercise prescription for resistance, speed, and METs.;Short Term: Perform resistance training exercises routinely during rehab and add in resistance training at home;Long Term: Improve cardiorespiratory fitness, muscular endurance and strength as measured by increased METs and functional capacity ( )       Able to understand and use rate of perceived exertion (RPE) scale Yes       Intervention Provide education and explanation on how to use RPE scale       Expected Outcomes Short Term: Able to use RPE daily in rehab to express subjective intensity level;Long Term:  Able to use RPE to guide intensity level when exercising independently       Able to understand and use Dyspnea scale Yes       Intervention Provide  education and explanation on how to use Dyspnea scale       Expected Outcomes Short Term: Able to use Dyspnea scale daily in rehab to express subjective sense of shortness of breath during exertion;Long Term: Able to use Dyspnea scale to guide intensity level when exercising independently       Knowledge and understanding of Target Heart Rate Range (THRR) Yes  Intervention Provide education and explanation of THRR including how the numbers were predicted and where they are located for reference       Expected Outcomes Short Term: Able to state/look up THRR;Short Term: Able to use daily as guideline for intensity in rehab;Long Term: Able to use THRR to govern intensity when exercising independently       Able to check pulse independently Yes       Intervention Provide education and demonstration on how to check pulse in carotid and radial arteries.;Review the importance of being able to check your own pulse for safety during independent exercise       Expected Outcomes Short Term: Able to explain why pulse checking is important during independent exercise;Long Term: Able to check pulse independently and accurately       Understanding of Exercise Prescription Yes       Intervention Provide education, explanation, and written materials on patient's individual exercise prescription       Expected Outcomes Short Term: Able to explain program exercise prescription;Long Term: Able to explain home exercise prescription to exercise independently          Exercise Goals Re-Evaluation :  Exercise Goals Re-Evaluation     Row Name 07/01/23 1627 07/08/23 1120 07/17/23 1521 07/31/23 1559 08/05/23 1128     Exercise Goal Re-Evaluation   Exercise Goals Review Able to understand and use rate of perceived exertion (RPE) scale;Able to understand and use Dyspnea scale;Knowledge and understanding of Target Heart Rate Range (THRR);Understanding of Exercise Prescription Increase Physical Activity;Increase Strength  and Stamina;Understanding of Exercise Prescription Increase Physical Activity;Increase Strength and Stamina;Understanding of Exercise Prescription Increase Physical Activity;Increase Strength and Stamina;Understanding of Exercise Prescription Increase Physical Activity;Able to understand and use rate of perceived exertion (RPE) scale;Increase Strength and Stamina;Able to understand and use Dyspnea scale;Knowledge and understanding of Target Heart Rate Range (THRR);Able to check pulse independently;Understanding of Exercise Prescription   Comments Reviewed RPE and dyspnea scale, THR and program prescription with pt today.  Pt voiced understanding and was given a copy of goals to take home. Jaslyne reports that when she walks on the track it bothers her hip. She is willing to try the TM to see if she tolerates walking better on the TM. Elsy is doing well in rehab. She was able to complete her first 6 sessions in the program during this review period. During her first few sessions she was able to increase her level on the XR from 1 to 3. We will continue to monitor her progress in the program. Lara continues to do well in rehab. She continues to walk 21 laps on the track and improved to level 2 on the recumbent bike. She also continues to do well with 2 lb hand weights for resistance training. We will continue to monitor her progress in the program. Reviewed home exercise with pt today.  Pt plans to walk as her hip tolerates it, look into gym membership and do strength and stretching exercises at home for exercise.  Reviewed THR, pulse, RPE, sign and symptoms, pulse oximetery and when to call 911 or MD.  Also discussed weather considerations and indoor options.  Pt voiced understanding.   Expected Outcomes Short: Use RPE daily to regulate intensity. Long: Follow program prescription in THR. Short: try TM and see if it causes less pain in hip then walking to track. Long: become independent with exercise routine.  Short: Continue to follow exercise prescription. Long: Continue exercise to improve strength and stamina. Short:  Continue to push for more laps on the track. Long: Continue exercise to improve strength and stamina. Short: add 1-2 days a week of exercise at home on off days of rehab as tolerated and look into a gym membership. Long: maintain independent exercise routine upon graduation from cardiac rehab.    Row Name 08/13/23 1042 08/29/23 1545 08/30/23 1128 09/12/23 1421 09/26/23 1640     Exercise Goal Re-Evaluation   Exercise Goals Review Increase Physical Activity;Understanding of Exercise Prescription;Increase Strength and Stamina Increase Physical Activity;Understanding of Exercise Prescription;Increase Strength and Stamina Increase Physical Activity;Understanding of Exercise Prescription;Increase Strength and Stamina Increase Physical Activity;Understanding of Exercise Prescription;Increase Strength and Stamina Increase Physical Activity;Understanding of Exercise Prescription;Increase Strength and Stamina   Comments Tersa continues to do well in rehab. She increased to 23 laps on the track and increased to level 3 on the T4 and T5 nusteps. She decreased down to level 1 on the recumbent bike and XR. We will continue to monitor her progress in the program. Lynley is doing well in rehab. She increased her walking distance to 25 laps on the track. She also continues to work at level 1 on the XR and recumbent bike. We will continue to monitor her progress in the program. Melida is doing well with exercise overall. She states that she still has some trouble walking due to her hip pain. She has not begun exercising on days away from rehab. She does have space to walk at home. We encouraged her to begin walking on days away from rehab. We will continue to monitor her progress in the program. Toriana continues to do well in the program. She has been able to maintain 25 laps on the track in 15 minutes. She has also  been able to increase from level 1 to 2 on the XR. We will continue to monitor her progress in the program. Taci continues to do well in the program. She recently completed her post and was able to improve by   Expected Outcomes Short: Try level 2 on the recumbent bike. Long: Continue exercise to improve strength and stamina. Short: Increase to level 2 on the recumbent bike. Long: Continue exercise to improve strength and stamina. Short: Begin walking on days away from rehab. Long: Continue exercise to improve strength and stamina. Short: Improve on , continue to push for more track laps. Long: Continue exercise to improve strength and stamina. --    Row Name 09/26/23 1641             Exercise Goal Re-Evaluation   Exercise Goals Review Increase Physical Activity;Understanding of Exercise Prescription;Increase Strength and Stamina       Comments Kalyse continues to do well in the program. She recently completed her post and was able to improve by 5 feet. She was also able to improve from level 1 to 3 on the T4 nustep. We will continue to monitor her progress in the program.       Expected Outcomes Short: Graduate. Long: Continue exercise to improve strength and stamina.          Discharge Exercise Prescription (Final Exercise Prescription Changes):  Exercise Prescription Changes - 09/26/23 1600       Response to Exercise   Blood Pressure (Admit) 116/64    Blood Pressure (Exit) 104/62    Heart Rate (Admit) 91 bpm    Heart Rate (Exercise) 97 bpm    Heart Rate (Exit) 79 bpm    Oxygen Saturation (Admit) 90 %  Oxygen Saturation (Exercise) 90 %    Oxygen Saturation (Exit) 93 %    Rating of Perceived Exertion (Exercise) 14    Symptoms none    Duration Continue with 30 min of aerobic exercise without signs/symptoms of physical distress.    Intensity THRR unchanged      Progression   Progression Continue to progress workloads to maintain intensity without signs/symptoms of  physical distress.    Average METs 2.37      Resistance Training   Training Prescription Yes    Weight 2 lb    Reps 10-15      Interval Training   Interval Training No      NuStep   Level 3    Minutes 15    METs 3.1      REL-XR   Level 1    Minutes 15    METs 3.1      T5 Nustep   Level 2    Minutes 15    METs 2      Track   Laps 25    Minutes 15    METs 2.36      Home Exercise Plan   Plans to continue exercise at Home (comment)   walk as tolerated, strength and stretching videos, and look into gym membership   Frequency Add 2 additional days to program exercise sessions.    Initial Home Exercises Provided 08/05/23      Oxygen   Maintain Oxygen Saturation 88% or higher          Nutrition:  Target Goals: Understanding of nutrition guidelines, daily intake of sodium 1500mg , cholesterol 200mg , calories 30% from fat and 7% or less from saturated fats, daily to have 5 or more servings of fruits and vegetables.  Education: All About Nutrition: -Group instruction provided by verbal, written material, interactive activities, discussions, models, and posters to present general guidelines for heart healthy nutrition including fat, fiber, MyPlate, the role of sodium in heart healthy nutrition, utilization of the nutrition label, and utilization of this knowledge for meal planning. Follow up email sent as well. Written material given at graduation. Flowsheet Row Cardiac Rehab from 07/03/2023 in [facility] Cardiac [facility] Pul[facility] Rehab  Date 07/03/23  Educator JG  Instruction Review Code 1- Verbalizes Understanding    Biometrics:  Pre Biometrics - 06/19/23 1601       Pre Biometrics   Height 5' 1.2 (1.554 m)    Weight 156 lb 1.6 oz (70.8 kg)    Waist Circumference 36.5 inches    Hip Circumference 38.2 inches    Waist to Hip Ratio 0.96 %    BMI (Calculated) 29.32    Single Leg Stand 7 seconds          Post Biometrics - 09/18/23 1139        Post  Biometrics   Height  5' 1.2 (1.554 m)    Weight 166 lb 14.4 oz (75.7 kg)    Waist Circumference 37 inches    Hip Circumference 39 inches    Waist to Hip Ratio 0.95 %    BMI (Calculated) 31.35    Single Leg Stand 30 seconds          Nutrition Therapy Plan and Nutrition Goals:  Nutrition Therapy & Goals - 06/19/23 1602       Personal Nutrition Goals   Nutrition Goal Will meet with RD in the next few weeks      Intervention Plan   Intervention Prescribe, educate and counsel  regarding individualized specific dietary modifications aiming towards targeted core components such as weight, hypertension, lipid management, diabetes, heart failure and other comorbidities.;Nutrition handout(s) given to patient.    Expected Outcomes Short Term Goal: Understand basic principles of dietary content, such as calories, fat, sodium, cholesterol and nutrients.;Short Term Goal: A plan has been developed with personal nutrition goals set during dietitian appointment.;Long Term Goal: Adherence to prescribed nutrition plan.          Nutrition Assessments:  MEDIFICTS Score Key: >=70 Need to make dietary changes  40-70 Heart Healthy Diet <= 40 Therapeutic Level Cholesterol Diet  Flowsheet Row Cardiac Rehab from 09/20/2023 in [facility] Cardiac and Pulmonary Rehab  Picture Your Plate Total Score on Admission 53  Picture Your Plate Total Score on Discharge 49   Picture Your Plate Scores: <21 Unhealthy dietary pattern with much room for improvement. 41-50 Dietary pattern unlikely to meet recommendations for good health and room for improvement. 51-60 More healthful dietary pattern, with some room for improvement.  >60 Healthy dietary pattern, although there may be some specific behaviors that could be improved.    Nutrition Goals Re-Evaluation:  Nutrition Goals Re-Evaluation     Row Name 07/08/23 1116 07/31/23 1120 08/30/23 1135         Goals   Nutrition Goal Decided to defer RD apt for now. Will let us  know if she  changes her mind. Deferred RD appointment Deferred RD appointment        Nutrition Goals Discharge (Final Nutrition Goals Re-Evaluation):  Nutrition Goals Re-Evaluation - 08/30/23 1135       Goals   Nutrition Goal Deferred RD appointment          Psychosocial: Target Goals: Acknowledge presence or absence of significant depression and/or stress, maximize coping skills, provide positive support system. Participant is able to verbalize types and ability to use techniques and skills needed for reducing stress and depression.   Education: Stress, Anxiety, and Depression - Group verbal and visual presentation to define topics covered.  Reviews how body is impacted by stress, anxiety, and depression.  Also discusses healthy ways to reduce stress and to treat/manage anxiety and depression.  Written material given at graduation.   Education: Sleep Hygiene -Provides group verbal and written instruction about how sleep can affect your health.  Define sleep hygiene, discuss sleep cycles and impact of sleep habits. Review good sleep hygiene tips.    Initial Review & Psychosocial Screening:  Initial Psych Review & Screening - 06/17/23 0954       Initial Review   Current issues with Current Depression;History of Depression;Current Psychotropic Meds;Current Stress Concerns    Source of Stress Concerns Chronic Illness    Comments Meliya is working on her anxiety since she has had alot of procedures and health issues.      Family Dynamics   Good Support System? Yes    Comments She can look to her children, husband and preacher for support.      Barriers   Psychosocial barriers to participate in program The patient should benefit from training in stress management and relaxation.      Screening Interventions   Interventions Provide feedback about the scores to participant;To provide support and resources with identified psychosocial needs;Encouraged to exercise    Expected Outcomes Short  Term goal: Utilizing psychosocial counselor, staff and physician to assist with identification of specific Stressors or current issues interfering with healing process. Setting desired goal for each stressor or current issue identified.;Long Term Goal: Stressors  or current issues are controlled or eliminated.;Long Term goal: The participant improves quality of Life and PHQ9 Scores as seen by post scores and/or verbalization of changes;Short Term goal: Identification and review with participant of any Quality of Life or Depression concerns found by scoring the questionnaire.          Quality of Life Scores:   Quality of Life - 09/20/23 1124       Quality of Life Scores   Health/Function Pre 18.8 %    Health/Function Post 16.5 %    Health/Function % Change -12.23 %    Socioeconomic Pre 21.75 %    Socioeconomic Post 21.07 %    Socioeconomic % Change  -3.13 %    Psych/Spiritual Pre 19.86 %    Psych/Spiritual Post 17.42 %    Psych/Spiritual % Change -12.29 %    Family Pre 22.8 %    Family Post 20.5 %    Family % Change -10.09 %    GLOBAL Pre 20.26 %    GLOBAL Post 18.24 %    GLOBAL % Change -9.97 %         Scores of 19 and below usually indicate a poorer quality of life in these areas.  A difference of  2-3 points is a clinically meaningful difference.  A difference of 2-3 points in the total score of the Quality of Life Index has been associated with significant improvement in overall quality of life, self-image, physical symptoms, and general health in studies assessing change in quality of life.  PHQ-9: Review Flowsheet  More data may exist      09/20/2023 08/30/2023 07/31/2023 07/08/2023 06/19/2023  Depression screen PHQ 2/9  Decreased Interest 1 1 1 1 1   Down, Depressed, Hopeless 1 1 1 1 1   PHQ - 2 Score 2 2 2 2 2   Altered sleeping 2 3 3 2 2   Tired, decreased energy 2 2 2 2 2   Change in appetite 1 0 0 0 0  Feeling bad or failure about yourself  1 0 0 1 0  Trouble concentrating  0 0 0 0 0  Moving slowly or fidgety/restless 0 0 0 0 0  Suicidal thoughts 0 0 0 0 0  PHQ-9 Score 8 7 7 7 6   Difficult doing work/chores Somewhat difficult Somewhat difficult Somewhat difficult Somewhat difficult Somewhat difficult   Interpretation of Total Score  Total Score Depression Severity:  1-4 = Minimal depression, 5-9 = Mild depression, 10-14 = Moderate depression, 15-19 = Moderately severe depression, 20-27 = Severe depression   Psychosocial Evaluation and Intervention:  Psychosocial Evaluation - 06/17/23 0957       Psychosocial Evaluation & Interventions   Interventions Encouraged to exercise with the program and follow exercise prescription;Relaxation education;Stress management education    Comments Creola is working on her anxiety since she has had alot of procedures and health issues.She can look to her children, husband and preacher for support.    Expected Outcomes Short: Attend [facility] stress management education to decrease stress. Long: Maintain exercise Post [facility] to keep stress at a minimum.    Continue Psychosocial Services  Follow up required by staff          Psychosocial Re-Evaluation:  Psychosocial Re-Evaluation     Row Name 07/08/23 1123 07/31/23 1125 08/30/23 1130         Psychosocial Re-Evaluation   Current issues with Current Depression;Current Psychotropic Meds;Current Stress Concerns;History of Depression;Current Anxiety/Panic Current Depression;Current Psychotropic Meds;Current Stress Concerns;History of Depression;Current Anxiety/Panic  Current Depression;Current Psychotropic Meds;Current Stress Concerns;History of Depression;Current Anxiety/Panic;Current Sleep Concerns     Comments Patient reports that she is working with her doctor currently to adjust medicaiton to help with her anxiety and sleep patterns. She does have sleep concerns but is wokring closely with her doctor to [facility] these concerns. She states that her mental heath and stress  levels are currently are about the same and will notify staff or doctor is there are any changes or new concerns. Reviewed patient health questionnaire (PHQ-9) with patient for follow up. Previously, patients score indicated signs/symptoms of depression.  Reviewed to see if patient is improving symptom wise while in program.  Score istayed the samae and patient states that it is because she has a procedure coming up and it is worrysome.. Reviewed patient health questionnaire (PHQ-9) with patient for follow up. Previously, patients score indicated signs/symptoms of depression.  Reviewed to see if patient is improving symptom wise while in program.  Her scores have not changed since the last time we reviewed the questionnaire. She states that she is turning to prayer and enjoys reading for stress relief. She is still having trouble sleeping.     Expected Outcomes Short: continue to work with doctor on current med combination to help with anxiety and sleep. Long: mangage mental health to remain stable. Short: Continue to attend [facility] regularly for regular exercise and social engagement. Long: Continue to improve symptoms and manage a positive mental state. Short: Continue to attend [facility] regularly for regular exercise and social engagement. Long: Continue to improve symptoms and manage a positive mental state.     Interventions Encouraged to attend Cardiac Rehabilitation for the exercise Encouraged to attend Cardiac Rehabilitation for the exercise Encouraged to attend Cardiac Rehabilitation for the exercise     Continue Psychosocial Services  Follow up required by staff Follow up required by staff Follow up required by staff        Psychosocial Discharge (Final Psychosocial Re-Evaluation):  Psychosocial Re-Evaluation - 08/30/23 1130       Psychosocial Re-Evaluation   Current issues with Current Depression;Current Psychotropic Meds;Current Stress Concerns;History of Depression;Current  Anxiety/Panic;Current Sleep Concerns    Comments Reviewed patient health questionnaire (PHQ-9) with patient for follow up. Previously, patients score indicated signs/symptoms of depression.  Reviewed to see if patient is improving symptom wise while in program.  Her scores have not changed since the last time we reviewed the questionnaire. She states that she is turning to prayer and enjoys reading for stress relief. She is still having trouble sleeping.    Expected Outcomes Short: Continue to attend [facility] regularly for regular exercise and social engagement. Long: Continue to improve symptoms and manage a positive mental state.    Interventions Encouraged to attend Cardiac Rehabilitation for the exercise    Continue Psychosocial Services  Follow up required by staff          Vocational Rehabilitation: Provide vocational rehab assistance to qualifying candidates.   Vocational Rehab Evaluation & Intervention:  Vocational Rehab - 09/20/23 1122       Initial Vocational Rehab Evaluation & Intervention   Assessment shows need for Vocational Rehabilitation No      Discharge Vocational Rehab   Discharge Vocational Rehabilitation no VR required          Education: Education Goals: Education classes will be provided on a variety of topics geared toward better understanding of heart health and risk factor modification. Participant will state understanding/return demonstration of topics presented as noted  by education test scores.  Learning Barriers/Preferences:  Learning Barriers/Preferences - 06/17/23 0953       Learning Barriers/Preferences   Learning Barriers None;Sight    Learning Preferences None          General Cardiac Education Topics:  AED/CPR: - Group verbal and written instruction with the use of models to demonstrate the basic use of the AED with the basic ABC's of resuscitation.   Anatomy and Cardiac Procedures: - Group verbal and visual presentation and  models provide information about basic cardiac anatomy and function. Reviews the testing methods done to diagnose heart disease and the outcomes of the test results. Describes the treatment choices: Medical Management, Angioplasty, or Coronary Bypass Surgery for treating various heart conditions including Myocardial Infarction, Angina, Valve Disease, and Cardiac Arrhythmias.  Written material given at graduation. Flowsheet Row Cardiac Rehab from 07/03/2023 in [facility] Cardiac and Pulmonary Rehab  Education need identified 06/19/23    Medication Safety: - Group verbal and visual instruction to review commonly prescribed medications for heart and lung disease. Reviews the medication, class of the drug, and side effects. Includes the steps to properly store meds and maintain the prescription regimen.  Written material given at graduation.   Intimacy: - Group verbal instruction through game format to discuss how heart and lung disease can affect sexual intimacy. Written material given at graduation..   Know Your Numbers and Heart Failure: - Group verbal and visual instruction to discuss disease risk factors for cardiac and pulmonary disease and treatment options.  Reviews associated critical values for Overweight/Obesity, Hypertension, Cholesterol, and Diabetes.  Discusses basics of heart failure: signs/symptoms and treatments.  Introduces Heart Failure Zone chart for action plan for heart failure.  Written material given at graduation.   Infection Prevention: - Provides verbal and written material to individual with discussion of infection control including proper hand washing and proper equipment cleaning during exercise session. Flowsheet Row Cardiac Rehab from 07/03/2023 in [facility] Cardiac and Pulmonary Rehab  Date 06/19/23  Educator MB  Instruction Review Code 1- Verbalizes Understanding    Falls Prevention: - Provides verbal and written material to individual with discussion of falls prevention  and safety. Flowsheet Row Cardiac Rehab from 07/03/2023 in [facility] Cardiac and Pulmonary Rehab  Date 06/19/23  Educator MB  Instruction Review Code 1- Verbalizes Understanding    Other: -Provides group and verbal instruction on various topics (see comments)   Knowledge Questionnaire Score:  Knowledge Questionnaire Score - 09/20/23 1122       Knowledge Questionnaire Score   Pre Score 23/26    Post Score 22/26          Core Components/Risk Factors/Patient Goals at Admission:  Personal Goals and Risk Factors at Admission - 06/19/23 1603       Core Components/Risk Factors/Patient Goals on Admission    Weight Management Yes;Weight Loss    Intervention Weight Management: Develop a combined nutrition and exercise program designed to reach desired caloric intake, while maintaining appropriate intake of nutrient and fiber, sodium and fats, and appropriate energy expenditure required for the weight goal.;Weight Management: Provide education and appropriate resources to help participant work on and attain dietary goals.;Weight Management/Obesity: Establish reasonable short term and long term weight goals.    Admit Weight 156 lb 1.6 oz (70.8 kg)    Goal Weight: Short Term 148 lb 8 oz (67.4 kg)    Goal Weight: Long Term 141 lb (64 kg)    Expected Outcomes Short Term: Continue to assess and modify  interventions until short term weight is achieved;Weight Loss: Understanding of general recommendations for a balanced deficit meal plan, which promotes 1-2 lb weight loss per week and includes a negative energy balance of [phone] kcal/d;Understanding recommendations for meals to include 15-35% energy as protein, 25-35% energy from fat, 35-60% energy from carbohydrates, less than 200mg  of dietary cholesterol, 20-35 gm of total fiber daily;Understanding of distribution of calorie intake throughout the day with the consumption of 4-5 meals/snacks;Long Term: Adherence to nutrition and physical  activity/exercise program aimed toward attainment of established weight goal    Hypertension Yes    Intervention Provide education on lifestyle modifcations including regular physical activity/exercise, weight management, moderate sodium restriction and increased consumption of fresh fruit, vegetables, and low fat dairy, alcohol moderation, and smoking cessation.;Monitor prescription use compliance.    Expected Outcomes Long Term: Maintenance of blood pressure at goal levels.;Short Term: Continued assessment and intervention until BP is < 140/76mm HG in hypertensive participants. < 130/32mm HG in hypertensive participants with diabetes, heart failure or chronic kidney disease.    Lipids Yes    Intervention Provide education and support for participant on nutrition & aerobic/resistive exercise along with prescribed medications to achieve LDL 70mg , HDL >40mg .    Expected Outcomes Short Term: Participant states understanding of desired cholesterol values and is compliant with medications prescribed. Participant is following exercise prescription and nutrition guidelines.;Long Term: Cholesterol controlled with medications as prescribed, with individualized exercise RX and with personalized nutrition plan. Value goals: LDL < 70mg , HDL > 40 mg.          Education:Diabetes - Individual verbal and written instruction to review signs/symptoms of diabetes, desired ranges of glucose level fasting, after meals and with exercise. Acknowledge that pre and post exercise glucose checks will be done for 3 sessions at entry of program.   Core Components/Risk Factors/Patient Goals Review:   Goals and Risk Factor Review     Row Name 07/08/23 1117 07/31/23 1121 08/30/23 1135         Core Components/Risk Factors/Patient Goals Review   Personal Goals Review Hypertension;Lipids Other Weight Management/Obesity;Hypertension     Review Elenie reports that she does monitor her blood pressure most days of the week at  home. She also reports that she is taking all her cholesterol meds and is not having any problems currently with her medicaiton. She follows up with her doctors regularly for required tests and lab work to monitor cardiac risk factors. Blessen is having a stent/repair on May 5th. She is not sure when she is able to return to rehab but is an outpatient procedure. Hokulani knows to inform us  when she is able to return. Brittanie states that she would like to lose some weight. She states that she has a goal weight of 135-140 lb. Her BP readings have stayed within normal ranges while attending rehab. She reports that she owns a BP cuff and checks her readings at home routinely. She also states that her BP has been good when she checks it at home. Kyeshia is taking all of her medications as prescribed.     Expected Outcomes Short: continue to check BP at home and monitor for any changes. Long: continue to manage cardiac risk factors with lifestyle choices. Short: have surgery. Long: retrun to rehab post surgery. Short: Continue to work towards weight goal through diet and exercise. Long: Continue to manage lifestyle risk factors.        Core Components/Risk Factors/Patient Goals at Discharge (Final Review):   Goals  and Risk Factor Review - 08/30/23 1135       Core Components/Risk Factors/Patient Goals Review   Personal Goals Review Weight Management/Obesity;Hypertension    Review Romy states that she would like to lose some weight. She states that she has a goal weight of 135-140 lb. Her BP readings have stayed within normal ranges while attending rehab. She reports that she owns a BP cuff and checks her readings at home routinely. She also states that her BP has been good when she checks it at home. Arnelle is taking all of her medications as prescribed.    Expected Outcomes Short: Continue to work towards weight goal through diet and exercise. Long: Continue to manage lifestyle risk factors.          ITP  Comments:  ITP Comments     Row Name 06/17/23 0951 06/19/23 1541 07/01/23 1626 07/17/23 0949 08/05/23 1137   ITP Comments Virtual Visit completed. Patient informed on EP and RD appointment and 6 Minute walk test. Patient also informed of patient health questionnaires on My Chart. Patient Verbalizes understanding. Visit diagnosis can be found in [facility] 03/12/[facility]23. Completed and gym orientation. Initial ITP created and sent for review to Dr. Eddy Goodell, Medical Director. First full day of exercise!  Patient was oriented to gym and equipment including functions, settings, policies, and procedures.  Patient's individual exercise prescription and treatment plan were reviewed.  All starting workloads were established based on the results of the 6 minute walk test done at initial orientation visit.  The plan for exercise progression was also introduced and progression will be customized based on patient's performance and goals. 30 Day review completed. Medical Director ITP review done, changes made as directed, and signed approval by Medical Director.    new to program Tinika will be out due to a medical procedure on 08/12/23. She will need medical clearance to return following her procedure.    Row Name 08/14/23 1135 09/11/23 1044 09/27/23 1108       ITP Comments 30 Day review completed. Medical Director ITP review done, changes made as directed, and signed approval by Medical Director. 30 Day review completed. Medical Director ITP review done, changes made as directed, and signed approval by Medical Director. Leslee graduated today from  rehab with 35 sessions completed.  Details of the patient's exercise prescription and what She needs to do in order to continue the prescription and progress were discussed with patient.  Patient was given a copy of prescription and goals.  Patient verbalized understanding. Elodie plans to continue to exercise by joining a gym.        Comments: Discharge ITP

## 2023-09-30 ENCOUNTER — Encounter

## 2023-10-03 DIAGNOSIS — I716 Thoracoabdominal aortic aneurysm, without rupture, unspecified: Secondary | ICD-10-CM | POA: Diagnosis not present

## 2023-10-03 DIAGNOSIS — Z951 Presence of aortocoronary bypass graft: Secondary | ICD-10-CM | POA: Diagnosis not present

## 2023-10-03 DIAGNOSIS — I10 Essential (primary) hypertension: Secondary | ICD-10-CM | POA: Diagnosis not present

## 2023-10-03 DIAGNOSIS — E785 Hyperlipidemia, unspecified: Secondary | ICD-10-CM | POA: Diagnosis not present

## 2023-10-03 DIAGNOSIS — I2510 Atherosclerotic heart disease of native coronary artery without angina pectoris: Secondary | ICD-10-CM | POA: Diagnosis not present

## 2023-10-03 DIAGNOSIS — E039 Hypothyroidism, unspecified: Secondary | ICD-10-CM | POA: Diagnosis not present

## 2023-10-03 DIAGNOSIS — K5730 Diverticulosis of large intestine without perforation or abscess without bleeding: Secondary | ICD-10-CM | POA: Diagnosis not present

## 2023-10-03 DIAGNOSIS — Z09 Encounter for follow-up examination after completed treatment for conditions other than malignant neoplasm: Secondary | ICD-10-CM | POA: Diagnosis not present

## 2023-10-03 DIAGNOSIS — Z006 Encounter for examination for normal comparison and control in clinical research program: Secondary | ICD-10-CM | POA: Diagnosis not present

## 2023-10-03 DIAGNOSIS — Z95828 Presence of other vascular implants and grafts: Secondary | ICD-10-CM | POA: Diagnosis not present

## 2023-10-23 ENCOUNTER — Other Ambulatory Visit (HOSPITAL_COMMUNITY): Payer: Self-pay

## 2023-10-23 ENCOUNTER — Other Ambulatory Visit: Payer: Self-pay

## 2023-10-24 ENCOUNTER — Other Ambulatory Visit (HOSPITAL_COMMUNITY): Payer: Self-pay

## 2023-10-29 ENCOUNTER — Ambulatory Visit (INDEPENDENT_AMBULATORY_CARE_PROVIDER_SITE_OTHER): Admitting: Vascular Surgery

## 2023-10-29 ENCOUNTER — Encounter (INDEPENDENT_AMBULATORY_CARE_PROVIDER_SITE_OTHER): Payer: Self-pay | Admitting: Vascular Surgery

## 2023-10-29 ENCOUNTER — Other Ambulatory Visit (HOSPITAL_COMMUNITY): Payer: Self-pay

## 2023-10-29 VITALS — BP 117/77 | HR 71 | Ht 60.0 in | Wt 169.1 lb

## 2023-10-29 DIAGNOSIS — I7161 Supraceliac aneurysm of the thoracoabdominal aorta, without rupture: Secondary | ICD-10-CM

## 2023-10-29 DIAGNOSIS — I10 Essential (primary) hypertension: Secondary | ICD-10-CM | POA: Diagnosis not present

## 2023-10-29 DIAGNOSIS — E785 Hyperlipidemia, unspecified: Secondary | ICD-10-CM

## 2023-10-29 NOTE — Assessment & Plan Note (Signed)
blood pressure control important in reducing the progression of atherosclerotic disease and aneurysmal growth. On appropriate oral medications.  

## 2023-10-29 NOTE — Assessment & Plan Note (Signed)
 I had a long talk with the patient and her daughter today.  We discussed the pathophysiology and natural history of thoracoabdominal aneurysms.  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  Dr. Missy is well-known for his expertise and experience with thoracoabdominal aneurysm repairs and and I told the patient that I actually sent him many patients for this issue as well.  I do believe his plan is appropriate and necessary.  I would agree with proceeding with the procedures with Dr. Missy in the near future.  I did try to answer all the patient's questions and alleviate any doubts that she had regarding the appropriateness of the procedure.  She voices her understanding and does plan to proceed with the proposed surgeries.

## 2023-10-29 NOTE — Patient Instructions (Signed)
Thoracic Aortic Aneurysm  An aneurysm is a bulge in an artery. It happens when blood pushes against a weak or damaged artery wall. A thoracic aortic aneurysm (TAA) happens in the upper part of the aorta, between the heart and the diaphragm. The aorta is the largest artery of the body. It supplies blood from the heart to the rest of the body. Some aneurysms may not cause symptoms. But a TAA can cause two serious problems: It can grow and then burst (rupture). It can cause blood to flow between the layers of the wall of the aorta through a tear (aortic dissection). These problems are medical emergencies. They can cause internal bleeding and should be treated right away. What are the causes? A TAA may be caused by: Cystic medial degeneration. This is when the fibers of the wall of the aorta become weak and expand. A genetic disease that weakens body tissue. This includes Marfan syndrome. In some cases, the cause is not known. What increases the risk? You may be more likely to have an aneurysm if: You are 70 years of age or older. You have a family history of aneurysms. You use or have used nicotine or tobacco products. You have: Arteriosclerosis. This is when your arteries harden. Arteritis. This is inflammation of the walls of an artery. An injury or trauma to your aorta. High blood pressure (hypertension). High cholesterol. Bicuspid aortic valve. This is when only two flaps (valves) over your aorta are working instead of three. This can make it harder for your heart to pump blood. What are the signs or symptoms? Symptoms depend on how big the aneurysm is and how fast it is growing. Most grow slowly and do not cause symptoms. In some cases, you may have: Pain in your chest, back, sides, or abdomen. If the pain is sudden and severe, it may mean that the aneurysm has ruptured. A cough or hoarseness. Shortness of breath. Trouble swallowing. Swelling in your face, arms, or  legs. Fainting. How is this diagnosed? This condition may be diagnosed based on: Ultrasound. X-rays. CT scan. MRI. Lab tests. Angiogram. This test checks your arteries for damage or blockage. Many aneurysms are found during exams for other conditions. How is this treated? Treatment depends on: The size of the aneurysm. How fast it is growing. Your age. Risk factors for rupture. If your aneurysm is smaller than 2.2 inches (5.5 cm), you may need: Medicines to: Control blood pressure. Treat pain. Fight infection. Control cholesterol. An ultrasound or CT scan every 6 months or year to see if the aneurysm is getting bigger. If your aneurysm is larger or growing fast, you may need surgery. Follow these instructions at home: Eating and drinking  Eat a healthy diet. You may need to: Take in less salt (sodium). Too much salt can raise your blood pressure and increase your risk for a TAA. Avoid foods that are high in saturated fat and cholesterol. These include red meat and some dairy products. Eat less sugar. Take in more fiber. You may need to eat more whole grains, vegetables, and fruit. Lifestyle Do not use any products that contain nicotine or tobacco. These products include cigarettes, chewing tobacco, and vaping devices, such as e-cigarettes. If you need help quitting, ask your health care provider. Check your blood pressure often. Follow instructions on how to keep it within normal limits. Have your blood sugar (glucose) level and cholesterol levels checked often. Exercise on a regular basis. Talk with your provider about how often  to exercise and which types of exercise are best for you. Maintain a healthy weight. Alcohol use Do not drink alcohol if: Your provider tells you not to drink. You are pregnant, may be pregnant, or plan to become pregnant. If you drink alcohol: Limit how much you have to: 0-1 drink a day if you are female. 0-2 drinks if you are female. Know how  much alcohol is in your drink. In the [place] one drink is one 12 oz bottle of beer (355 mL), one 5 oz glass of wine (148 mL), or one 1 oz glass of hard liquor (44 mL). General instructions Take over-the-counter and prescription medicines only as told by your provider. You may have to avoid lifting. Ask your provider how much you can safely lift. Keep all follow-up visits. Your provider will need to watch the size of your aneurysm and how fast it is growing. Contact a health care provider if: You lose weight and do not know why. You have trouble swallowing. You have a cough or hoarseness. Get help right away if: You have sudden, severe pain in your abdomen, side, or back. It may move into your chest and arms. You are short of breath. You feel light-headed, or you faint. You have any symptoms of a stroke. "BE FAST" is an easy way to remember the main warning signs of a stroke: B - Balance. Signs are dizziness, sudden trouble walking, or loss of balance. E - Eyes. Signs are trouble seeing or a sudden change in vision. F - Face. Signs are sudden weakness or numbness of the face, or the face or eyelid drooping on one side. A - Arms. Signs are weakness or numbness in an arm. This happens suddenly and usually on one side of the body. S - Speech. Signs are sudden trouble speaking, slurred speech, or trouble understanding what people say. T - Time. Time to call emergency services. Write down what time symptoms started. You have other signs of a stroke, such as: A sudden, severe headache with no known cause. Nausea or vomiting. Seizure. These symptoms may be an emergency. Get help right away. Call 911. Do not wait to see if the symptoms will go away. Do not drive yourself to the hospital. This information is not intended to replace advice given to you by your health care provider. Make sure you discuss any questions you have with your health care provider. Document Revised: 10/26/2021 Document  Reviewed: 10/26/2021 Elsevier Patient Education  2024 [employer].

## 2023-10-29 NOTE — Progress Notes (Signed)
 Patient ID: Kaylee Pope, female   DOB: [birth year], 68 y.o.   MRN: [id number]  Chief Complaint  Patient presents with   np. consult. second opinion. Thoracoabdominal aortic aneury    HPI Kaylee Pope is a 68 y.o. female.  I am asked to see the patient by Dr. Valora for a second opinion regarding her thoracoabdominal aneurysm.  She has been cared for by Dr. Missy at [facility] and had multiple procedures to repair her thoracoabdominal aneurysm.  She initially had a thoracic stent graft followed by a fenestrated stent graft with deep branching of the visceral vessels and renal arteries.  She had postoperative left leg ischemia that was treated as well.  She has subsequently had renal reintervention.  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  He is recommending proximal extension with stenting of the left subclavian artery concomitant to be able to extend proximally to get proximal seal.  It sounds like she may need Onyx glue for a type 2 endoleak as well.  She desired to see me as a second opinion as a care for several of her friends and family members and due to the difficulty she had particularly with the second procedure.     Past Medical History:  Diagnosis Date   Allergy    CAD (coronary artery disease)    a. s/p 4-V CABG (LIMA-LAD, VG-diag, VG-OM, VG-PDA) 07/2022   Hyperlipidemia    Hypertension    Thoracoabdominal aortic aneurysm (HCC)    Thyroid  disease     Past Surgical History:  Procedure Laterality Date   ABDOMINAL AORTIC ANEURYSM REPAIR     CORONARY ARTERY BYPASS GRAFT N/A 07/27/2022   Procedure: CORONARY ARTERY BYPASS GRAFTING (CABG) X4 USING LEFT INTERNAL MAMMARY ARTERY AND BILATERAL ENDOSCOPIC HARVESTED GREATER SAPHENOUS VEINS;  Surgeon: Lucas Dorise POUR, MD;  Location: [facility] OR;  Service: Open Heart Surgery;  Laterality: N/A;   LEFT HEART CATH AND CORONARY ANGIOGRAPHY N/A 07/23/2022   Procedure: LEFT HEART CATH AND CORONARY  ANGIOGRAPHY;  Surgeon: Mady Bruckner, MD;  Location: [facility] INVASIVE CV LAB;  Service: Cardiovascular;  Laterality: N/A;   LEFT HEART CATH AND CORS/GRAFTS ANGIOGRAPHY N/A 12/17/2022   Procedure: LEFT HEART CATH AND CORS/GRAFTS ANGIOGRAPHY;  Surgeon: Mady Bruckner, MD;  Location: [facility] INVASIVE CV LAB;  Service: Cardiovascular;  Laterality: N/A;   TEE WITHOUT CARDIOVERSION N/A 07/27/2022   Procedure: TRANSESOPHAGEAL ECHOCARDIOGRAM;  Surgeon: Lucas Dorise POUR, MD;  Location: [facility] OR;  Service: Open Heart Surgery;  Laterality: N/A;   TONSILLECTOMY     age 40     Family History  Problem Relation Age of Onset   COPD Mother    Heart attack Father    Stroke Father    Diabetes Sister    Cancer Maternal Aunt        breast & uterine   Breast cancer Maternal Aunt 60   Heart Problems Maternal Grandmother       Social History   Tobacco Use   Smoking status: Former    Current packs/day: 0.25    Average packs/day: 0.3 packs/day for 50.0 years (12.5 ttl pk-yrs)    Types: Cigarettes    Passive exposure: Current   Smokeless tobacco: Never   Tobacco comments:    Stopped smoking 4/46/2024  Vaping Use   Vaping status: Never Used  Substance Use Topics   Alcohol use: Not Currently   Drug use: Never  No Known Allergies  Current Outpatient Medications  Medication Sig Dispense Refill   acetaminophen  (TYLENOL ) 500 MG tablet Take 1,000 mg by mouth every 6 (six) hours as needed for moderate pain.     ALPRAZolam (XANAX) 0.5 MG tablet Take 0.5 mg by mouth daily as needed. PRN     amLODipine  (NORVASC ) 10 MG tablet Take 1 tablet (10 mg total) by mouth daily. (Patient taking differently: Take 10 mg by mouth daily. Patient states that she is taking 5 mg daily) 90 tablet 3   ARIPiprazole  (ABILIFY ) 2 MG tablet Take 1 tablet (2 mg total) by mouth daily. 90 tablet 1   carvedilol  (COREG ) 12.5 MG tablet Take 1 tablet (12.5 mg total) by mouth 2 (two) times daily. 180 tablet 3   furosemide  (LASIX ) 20 MG  tablet Take 1 tablet (20 mg total) by mouth daily as needed (shortness of breath). (Patient taking differently: Take 20 mg by mouth daily.) 90 tablet 3   levothyroxine  (SYNTHROID ) 75 MCG tablet Take 1 tablet (75 mcg total) by mouth on empty stomach with a glass of water at least 30-60 minutes before breakfast. 90 tablet 3   rosuvastatin  (CRESTOR ) 20 MG tablet Take 1 tablet (20 mg total) by mouth daily. 90 tablet 3   traZODone  (DESYREL ) 50 MG tablet Take 1 tablet (50 mg total) by mouth at bedtime 90 tablet 3   venlafaxine  XR (EFFEXOR -XR) 150 MG 24 hr capsule Take 1 capsule (150 mg total) by mouth daily. 90 capsule 3   No current facility-administered medications for this visit.      REVIEW OF SYSTEMS (Negative unless checked)  Constitutional: [] Weight loss  [] Fever  [] Chills Cardiac: [] Chest pain   [] Chest pressure   [] Palpitations   [] Shortness of breath when laying flat   [] Shortness of breath at rest   [x] Shortness of breath with exertion. Vascular:  [] Pain in legs with walking   [] Pain in legs at rest   [] Pain in legs when laying flat   [] Claudication   [] Pain in feet when walking  [] Pain in feet at rest  [] Pain in feet when laying flat   [] History of DVT   [] Phlebitis   [] Swelling in legs   [] Varicose veins   [] Non-healing ulcers Pulmonary:   [] Uses home oxygen   [] Productive cough   [] Hemoptysis   [] Wheeze  [] COPD   [] Asthma Neurologic:  [] Dizziness  [] Blackouts   [] Seizures   [] History of stroke   [] History of TIA  [] Aphasia   [] Temporary blindness   [] Dysphagia   [] Weakness or numbness in arms   [] Weakness or numbness in legs Musculoskeletal:  [x] Arthritis   [] Joint swelling   [] Joint pain   [] Low back pain Hematologic:  [] Easy bruising  [] Easy bleeding   [] Hypercoagulable state   [] Anemic  [] Hepatitis Gastrointestinal:  [] Blood in stool   [] Vomiting blood  [] Gastroesophageal reflux/heartburn   [] Abdominal pain Genitourinary:  [] Chronic kidney disease   [] Difficult urination  [] Frequent  urination  [] Burning with urination   [] Hematuria Skin:  [] Rashes   [] Ulcers   [] Wounds Psychological:  [] History of anxiety   [x]  History of major depression.    Physical Exam BP 117/77   Pulse 71   Ht 5' (1.524 m)   Wt 169 lb 2 oz (76.7 kg)   BMI 33.03 kg/m  Gen:  WD/WN, NAD Head: [place]/AT, No temporalis wasting.  Ear/Nose/Throat: Hearing grossly intact, nares w/o erythema or drainage, oropharynx w/o Erythema/Exudate Eyes: Conjunctiva clear, sclera non-icteric  Neck: trachea midline.  No  JVD.  Pulmonary:  Good air movement, respirations not labored, no use of accessory muscles  Cardiac: RRR, no JVD Vascular:  Vessel Right Left  Radial Palpable Palpable                          PT Palpable Palpable  DP Palpable Palpable   Gastrointestinal:. No masses, surgical incisions, or scars. Musculoskeletal: M/S 5/5 throughout.  Extremities without ischemic changes.  No deformity or atrophy.  Neurologic: Sensation grossly intact in extremities.  Symmetrical.  Speech is fluent. Motor exam as listed above. Psychiatric: Judgment intact, Mood & affect appropriate for pt's clinical situation. Dermatologic: No rashes or ulcers noted.  No cellulitis or open wounds.    Radiology No results found.  Labs No results found for this or any previous visit (from the past 2160 hours).  Assessment/Plan:  Essential hypertension blood pressure control important in reducing the progression of atherosclerotic disease and aneurysmal growth. On appropriate oral medications.   Hyperlipidemia LDL goal <70 lipid control important in reducing the progression of atherosclerotic disease. 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  We discussed the pathophysiology and natural history of thoracoabdominal aneurysms.  We discussed why type I endoleak was an extremely significant problem and does require  extension with repair.  Dr. Missy is well-known for his expertise and experience with thoracoabdominal aneurysm repairs and and I told the patient that I actually sent him many patients for this issue as well.  I do believe his plan is appropriate and necessary.  I would agree with proceeding with the procedures with Dr. Missy in the near future.  I did try to answer all the patient's questions and alleviate any doubts that she had regarding the appropriateness of the procedure.  She voices her understanding and does plan to proceed with the proposed surgeries.      Selinda Gu 10/29/2023, 12:50 PM   This note was created with Dragon medical transcription system.  Any errors from dictation are unintentional.

## 2023-10-29 NOTE — Assessment & Plan Note (Signed)
 lipid control important in reducing the progression of atherosclerotic disease. Continue statin therapy

## 2023-11-04 ENCOUNTER — Other Ambulatory Visit: Payer: Self-pay

## 2023-11-04 ENCOUNTER — Other Ambulatory Visit (HOSPITAL_COMMUNITY): Payer: Self-pay

## 2023-11-04 DIAGNOSIS — J449 Chronic obstructive pulmonary disease, unspecified: Secondary | ICD-10-CM | POA: Diagnosis not present

## 2023-11-04 DIAGNOSIS — R0609 Other forms of dyspnea: Secondary | ICD-10-CM | POA: Diagnosis not present

## 2023-11-04 DIAGNOSIS — K449 Diaphragmatic hernia without obstruction or gangrene: Secondary | ICD-10-CM | POA: Diagnosis not present

## 2023-11-04 DIAGNOSIS — R0602 Shortness of breath: Secondary | ICD-10-CM | POA: Diagnosis not present

## 2023-11-04 DIAGNOSIS — Z87891 Personal history of nicotine dependence: Secondary | ICD-10-CM | POA: Diagnosis not present

## 2023-11-04 MED ORDER — TRELEGY ELLIPTA 100-62.5-25 MCG/ACT IN AEPB
1.0000 | INHALATION_SPRAY | Freq: Every day | RESPIRATORY_TRACT | 2 refills | Status: DC
  Filled 2023-11-04: qty 60, 30d supply, fill #0
  Filled 2023-11-29: qty 60, 30d supply, fill #1
  Filled 2023-11-30 – 2023-12-30 (×2): qty 60, 30d supply, fill #2
  Filled 2024-01-28: qty 60, 30d supply, fill #3
  Filled 2024-02-23: qty 60, 30d supply, fill #4

## 2023-11-04 MED ORDER — AZITHROMYCIN 250 MG PO TABS
ORAL_TABLET | ORAL | 0 refills | Status: DC
  Filled 2023-11-04: qty 6, 5d supply, fill #0

## 2023-11-04 MED ORDER — METHYLPREDNISOLONE 4 MG PO TBPK
ORAL_TABLET | ORAL | 0 refills | Status: DC
  Filled 2023-11-04: qty 21, 6d supply, fill #0

## 2023-11-06 ENCOUNTER — Other Ambulatory Visit: Payer: Self-pay | Admitting: Pulmonary Disease

## 2023-11-06 DIAGNOSIS — R0602 Shortness of breath: Secondary | ICD-10-CM

## 2023-11-07 DIAGNOSIS — I716 Thoracoabdominal aortic aneurysm, without rupture, unspecified: Secondary | ICD-10-CM | POA: Diagnosis not present

## 2023-11-13 ENCOUNTER — Ambulatory Visit
Admission: RE | Admit: 2023-11-13 | Discharge: 2023-11-13 | Disposition: A | Discharge status: Home / Self Care | Source: Ambulatory Visit | Attending: Pulmonary Disease | Admitting: Pulmonary Disease

## 2023-11-13 DIAGNOSIS — Z0189 Encounter for other specified special examinations: Secondary | ICD-10-CM | POA: Diagnosis not present

## 2023-11-13 DIAGNOSIS — R0602 Shortness of breath: Secondary | ICD-10-CM | POA: Diagnosis not present

## 2023-11-29 ENCOUNTER — Other Ambulatory Visit (HOSPITAL_COMMUNITY): Payer: Self-pay

## 2023-11-30 ENCOUNTER — Other Ambulatory Visit (HOSPITAL_COMMUNITY): Payer: Self-pay

## 2023-12-03 DIAGNOSIS — I716 Thoracoabdominal aortic aneurysm, without rupture, unspecified: Secondary | ICD-10-CM | POA: Diagnosis not present

## 2023-12-03 DIAGNOSIS — I10 Essential (primary) hypertension: Secondary | ICD-10-CM | POA: Diagnosis not present

## 2023-12-03 DIAGNOSIS — E782 Mixed hyperlipidemia: Secondary | ICD-10-CM | POA: Diagnosis not present

## 2023-12-03 DIAGNOSIS — E039 Hypothyroidism, unspecified: Secondary | ICD-10-CM | POA: Diagnosis not present

## 2023-12-03 DIAGNOSIS — J449 Chronic obstructive pulmonary disease, unspecified: Secondary | ICD-10-CM | POA: Diagnosis not present

## 2023-12-03 DIAGNOSIS — F419 Anxiety disorder, unspecified: Secondary | ICD-10-CM | POA: Diagnosis not present

## 2023-12-04 ENCOUNTER — Other Ambulatory Visit (HOSPITAL_COMMUNITY): Payer: Self-pay

## 2023-12-08 ENCOUNTER — Other Ambulatory Visit (HOSPITAL_COMMUNITY): Payer: Self-pay

## 2023-12-13 ENCOUNTER — Other Ambulatory Visit (HOSPITAL_COMMUNITY): Payer: Self-pay

## 2023-12-17 DIAGNOSIS — I2510 Atherosclerotic heart disease of native coronary artery without angina pectoris: Secondary | ICD-10-CM | POA: Diagnosis not present

## 2023-12-17 DIAGNOSIS — K219 Gastro-esophageal reflux disease without esophagitis: Secondary | ICD-10-CM | POA: Diagnosis not present

## 2023-12-17 DIAGNOSIS — Z01818 Encounter for other preprocedural examination: Secondary | ICD-10-CM | POA: Diagnosis not present

## 2023-12-17 DIAGNOSIS — Z79899 Other long term (current) drug therapy: Secondary | ICD-10-CM | POA: Diagnosis not present

## 2023-12-17 DIAGNOSIS — Z7951 Long term (current) use of inhaled steroids: Secondary | ICD-10-CM | POA: Diagnosis not present

## 2023-12-17 DIAGNOSIS — Z87891 Personal history of nicotine dependence: Secondary | ICD-10-CM | POA: Diagnosis not present

## 2023-12-17 DIAGNOSIS — I10 Essential (primary) hypertension: Secondary | ICD-10-CM | POA: Diagnosis not present

## 2023-12-17 DIAGNOSIS — Z79890 Hormone replacement therapy (postmenopausal): Secondary | ICD-10-CM | POA: Diagnosis not present

## 2023-12-17 DIAGNOSIS — I714 Abdominal aortic aneurysm, without rupture, unspecified: Secondary | ICD-10-CM | POA: Diagnosis not present

## 2023-12-17 DIAGNOSIS — J449 Chronic obstructive pulmonary disease, unspecified: Secondary | ICD-10-CM | POA: Diagnosis not present

## 2023-12-17 DIAGNOSIS — Z951 Presence of aortocoronary bypass graft: Secondary | ICD-10-CM | POA: Diagnosis not present

## 2023-12-17 DIAGNOSIS — Z7982 Long term (current) use of aspirin: Secondary | ICD-10-CM | POA: Diagnosis not present

## 2023-12-17 DIAGNOSIS — I716 Thoracoabdominal aortic aneurysm, without rupture, unspecified: Secondary | ICD-10-CM | POA: Diagnosis not present

## 2023-12-17 DIAGNOSIS — E039 Hypothyroidism, unspecified: Secondary | ICD-10-CM | POA: Diagnosis not present

## 2023-12-30 ENCOUNTER — Other Ambulatory Visit (HOSPITAL_COMMUNITY): Payer: Self-pay

## 2024-01-07 DIAGNOSIS — I7777 Dissection of artery of lower extremity: Secondary | ICD-10-CM | POA: Diagnosis not present

## 2024-01-07 DIAGNOSIS — T82310A Breakdown (mechanical) of aortic (bifurcation) graft (replacement), initial encounter: Secondary | ICD-10-CM | POA: Diagnosis not present

## 2024-01-07 DIAGNOSIS — Z95820 Peripheral vascular angioplasty status with implants and grafts: Secondary | ICD-10-CM | POA: Diagnosis not present

## 2024-01-07 DIAGNOSIS — I2510 Atherosclerotic heart disease of native coronary artery without angina pectoris: Secondary | ICD-10-CM | POA: Diagnosis not present

## 2024-01-07 DIAGNOSIS — I97418 Intraoperative hemorrhage and hematoma of a circulatory system organ or structure complicating other circulatory system procedure: Secondary | ICD-10-CM | POA: Diagnosis not present

## 2024-01-07 DIAGNOSIS — Z87891 Personal history of nicotine dependence: Secondary | ICD-10-CM | POA: Diagnosis not present

## 2024-01-07 DIAGNOSIS — J449 Chronic obstructive pulmonary disease, unspecified: Secondary | ICD-10-CM | POA: Diagnosis not present

## 2024-01-07 DIAGNOSIS — R918 Other nonspecific abnormal finding of lung field: Secondary | ICD-10-CM | POA: Diagnosis not present

## 2024-01-07 DIAGNOSIS — I716 Thoracoabdominal aortic aneurysm, without rupture, unspecified: Secondary | ICD-10-CM | POA: Diagnosis not present

## 2024-01-07 DIAGNOSIS — E039 Hypothyroidism, unspecified: Secondary | ICD-10-CM | POA: Diagnosis not present

## 2024-01-07 DIAGNOSIS — I739 Peripheral vascular disease, unspecified: Secondary | ICD-10-CM | POA: Diagnosis not present

## 2024-01-07 DIAGNOSIS — Z951 Presence of aortocoronary bypass graft: Secondary | ICD-10-CM | POA: Diagnosis not present

## 2024-01-07 DIAGNOSIS — E785 Hyperlipidemia, unspecified: Secondary | ICD-10-CM | POA: Diagnosis not present

## 2024-01-07 DIAGNOSIS — I772 Rupture of artery: Secondary | ICD-10-CM | POA: Diagnosis not present

## 2024-01-07 DIAGNOSIS — Z48812 Encounter for surgical aftercare following surgery on the circulatory system: Secondary | ICD-10-CM | POA: Diagnosis not present

## 2024-01-07 DIAGNOSIS — T82330A Leakage of aortic (bifurcation) graft (replacement), initial encounter: Secondary | ICD-10-CM | POA: Diagnosis not present

## 2024-01-07 DIAGNOSIS — I714 Abdominal aortic aneurysm, without rupture, unspecified: Secondary | ICD-10-CM | POA: Diagnosis not present

## 2024-01-07 DIAGNOSIS — I70201 Unspecified atherosclerosis of native arteries of extremities, right leg: Secondary | ICD-10-CM | POA: Diagnosis not present

## 2024-01-07 DIAGNOSIS — I25119 Atherosclerotic heart disease of native coronary artery with unspecified angina pectoris: Secondary | ICD-10-CM | POA: Diagnosis not present

## 2024-01-07 DIAGNOSIS — K641 Second degree hemorrhoids: Secondary | ICD-10-CM | POA: Diagnosis not present

## 2024-01-07 DIAGNOSIS — Z95828 Presence of other vascular implants and grafts: Secondary | ICD-10-CM | POA: Diagnosis not present

## 2024-01-07 DIAGNOSIS — I10 Essential (primary) hypertension: Secondary | ICD-10-CM | POA: Diagnosis not present

## 2024-01-07 DIAGNOSIS — D62 Acute posthemorrhagic anemia: Secondary | ICD-10-CM | POA: Diagnosis not present

## 2024-01-07 DIAGNOSIS — R0602 Shortness of breath: Secondary | ICD-10-CM | POA: Diagnosis not present

## 2024-01-09 ENCOUNTER — Other Ambulatory Visit (HOSPITAL_COMMUNITY): Payer: Self-pay

## 2024-01-09 MED ORDER — TRAMADOL HCL 50 MG PO TABS
50.0000 mg | ORAL_TABLET | Freq: Four times a day (QID) | ORAL | 0 refills | Status: DC | PRN
  Filled 2024-01-09: qty 8, 2d supply, fill #0

## 2024-01-11 DIAGNOSIS — Z9981 Dependence on supplemental oxygen: Secondary | ICD-10-CM | POA: Diagnosis not present

## 2024-01-11 DIAGNOSIS — Z48812 Encounter for surgical aftercare following surgery on the circulatory system: Secondary | ICD-10-CM | POA: Diagnosis not present

## 2024-01-11 DIAGNOSIS — Z9181 History of falling: Secondary | ICD-10-CM | POA: Diagnosis not present

## 2024-01-11 DIAGNOSIS — Z87891 Personal history of nicotine dependence: Secondary | ICD-10-CM | POA: Diagnosis not present

## 2024-01-11 DIAGNOSIS — Z951 Presence of aortocoronary bypass graft: Secondary | ICD-10-CM | POA: Diagnosis not present

## 2024-01-11 DIAGNOSIS — I10 Essential (primary) hypertension: Secondary | ICD-10-CM | POA: Diagnosis not present

## 2024-01-11 DIAGNOSIS — J449 Chronic obstructive pulmonary disease, unspecified: Secondary | ICD-10-CM | POA: Diagnosis not present

## 2024-01-11 DIAGNOSIS — Z7951 Long term (current) use of inhaled steroids: Secondary | ICD-10-CM | POA: Diagnosis not present

## 2024-01-11 DIAGNOSIS — I2510 Atherosclerotic heart disease of native coronary artery without angina pectoris: Secondary | ICD-10-CM | POA: Diagnosis not present

## 2024-01-11 DIAGNOSIS — Z7982 Long term (current) use of aspirin: Secondary | ICD-10-CM | POA: Diagnosis not present

## 2024-01-11 DIAGNOSIS — E039 Hypothyroidism, unspecified: Secondary | ICD-10-CM | POA: Diagnosis not present

## 2024-01-11 DIAGNOSIS — E785 Hyperlipidemia, unspecified: Secondary | ICD-10-CM | POA: Diagnosis not present

## 2024-01-12 ENCOUNTER — Other Ambulatory Visit (HOSPITAL_COMMUNITY): Payer: Self-pay

## 2024-01-14 ENCOUNTER — Other Ambulatory Visit (HOSPITAL_COMMUNITY): Payer: Self-pay

## 2024-01-21 ENCOUNTER — Other Ambulatory Visit (HOSPITAL_COMMUNITY): Payer: Self-pay

## 2024-01-28 ENCOUNTER — Other Ambulatory Visit: Payer: Self-pay

## 2024-01-29 ENCOUNTER — Other Ambulatory Visit (HOSPITAL_COMMUNITY): Payer: Self-pay

## 2024-01-30 ENCOUNTER — Other Ambulatory Visit (HOSPITAL_COMMUNITY): Payer: Self-pay

## 2024-01-30 DIAGNOSIS — E785 Hyperlipidemia, unspecified: Secondary | ICD-10-CM | POA: Diagnosis not present

## 2024-01-30 DIAGNOSIS — J449 Chronic obstructive pulmonary disease, unspecified: Secondary | ICD-10-CM | POA: Diagnosis not present

## 2024-01-30 DIAGNOSIS — I10 Essential (primary) hypertension: Secondary | ICD-10-CM | POA: Diagnosis not present

## 2024-01-30 DIAGNOSIS — E782 Mixed hyperlipidemia: Secondary | ICD-10-CM | POA: Diagnosis not present

## 2024-01-30 DIAGNOSIS — Z7982 Long term (current) use of aspirin: Secondary | ICD-10-CM | POA: Diagnosis not present

## 2024-01-30 DIAGNOSIS — F4323 Adjustment disorder with mixed anxiety and depressed mood: Secondary | ICD-10-CM | POA: Diagnosis not present

## 2024-01-30 DIAGNOSIS — I2510 Atherosclerotic heart disease of native coronary artery without angina pectoris: Secondary | ICD-10-CM | POA: Diagnosis not present

## 2024-01-30 DIAGNOSIS — I716 Thoracoabdominal aortic aneurysm, without rupture, unspecified: Secondary | ICD-10-CM | POA: Diagnosis not present

## 2024-01-30 DIAGNOSIS — E039 Hypothyroidism, unspecified: Secondary | ICD-10-CM | POA: Diagnosis not present

## 2024-01-30 DIAGNOSIS — R0902 Hypoxemia: Secondary | ICD-10-CM | POA: Diagnosis not present

## 2024-01-30 DIAGNOSIS — Z48812 Encounter for surgical aftercare following surgery on the circulatory system: Secondary | ICD-10-CM | POA: Diagnosis not present

## 2024-01-30 MED ORDER — VENLAFAXINE HCL ER 75 MG PO CP24
75.0000 mg | ORAL_CAPSULE | Freq: Every day | ORAL | 3 refills | Status: AC
  Filled 2024-01-30: qty 90, 90d supply, fill #0
  Filled 2024-04-06: qty 90, 90d supply, fill #1

## 2024-02-06 DIAGNOSIS — K449 Diaphragmatic hernia without obstruction or gangrene: Secondary | ICD-10-CM | POA: Diagnosis not present

## 2024-02-06 DIAGNOSIS — Z48812 Encounter for surgical aftercare following surgery on the circulatory system: Secondary | ICD-10-CM | POA: Diagnosis not present

## 2024-02-06 DIAGNOSIS — I10 Essential (primary) hypertension: Secondary | ICD-10-CM | POA: Diagnosis not present

## 2024-02-06 DIAGNOSIS — F17210 Nicotine dependence, cigarettes, uncomplicated: Secondary | ICD-10-CM | POA: Diagnosis not present

## 2024-02-06 DIAGNOSIS — E785 Hyperlipidemia, unspecified: Secondary | ICD-10-CM | POA: Diagnosis not present

## 2024-02-06 DIAGNOSIS — I7142 Juxtarenal abdominal aortic aneurysm, without rupture: Secondary | ICD-10-CM | POA: Diagnosis not present

## 2024-02-06 DIAGNOSIS — D259 Leiomyoma of uterus, unspecified: Secondary | ICD-10-CM | POA: Diagnosis not present

## 2024-02-06 DIAGNOSIS — Z7982 Long term (current) use of aspirin: Secondary | ICD-10-CM | POA: Diagnosis not present

## 2024-02-06 DIAGNOSIS — K5730 Diverticulosis of large intestine without perforation or abscess without bleeding: Secondary | ICD-10-CM | POA: Diagnosis not present

## 2024-02-06 DIAGNOSIS — I2510 Atherosclerotic heart disease of native coronary artery without angina pectoris: Secondary | ICD-10-CM | POA: Diagnosis not present

## 2024-02-06 DIAGNOSIS — J449 Chronic obstructive pulmonary disease, unspecified: Secondary | ICD-10-CM | POA: Diagnosis not present

## 2024-02-06 DIAGNOSIS — M479 Spondylosis, unspecified: Secondary | ICD-10-CM | POA: Diagnosis not present

## 2024-02-06 DIAGNOSIS — E039 Hypothyroidism, unspecified: Secondary | ICD-10-CM | POA: Diagnosis not present

## 2024-02-06 DIAGNOSIS — Z4789 Encounter for other orthopedic aftercare: Secondary | ICD-10-CM | POA: Diagnosis not present

## 2024-02-06 DIAGNOSIS — I716 Thoracoabdominal aortic aneurysm, without rupture, unspecified: Secondary | ICD-10-CM | POA: Diagnosis not present

## 2024-02-06 DIAGNOSIS — Z95828 Presence of other vascular implants and grafts: Secondary | ICD-10-CM | POA: Diagnosis not present

## 2024-02-07 ENCOUNTER — Other Ambulatory Visit (HOSPITAL_COMMUNITY): Payer: Self-pay

## 2024-02-07 MED ORDER — CLOPIDOGREL BISULFATE 75 MG PO TABS
75.0000 mg | ORAL_TABLET | Freq: Every day | ORAL | 3 refills | Status: AC
  Filled 2024-02-07: qty 90, 90d supply, fill #0
  Filled 2024-04-23: qty 90, 90d supply, fill #1

## 2024-02-16 ENCOUNTER — Other Ambulatory Visit (HOSPITAL_COMMUNITY): Payer: Self-pay

## 2024-02-17 ENCOUNTER — Ambulatory Visit: Attending: Physician Assistant | Admitting: Physician Assistant

## 2024-02-17 ENCOUNTER — Encounter: Payer: Self-pay | Admitting: Physician Assistant

## 2024-02-17 VITALS — BP 115/58 | HR 77 | Ht 60.0 in | Wt 172.6 lb

## 2024-02-17 DIAGNOSIS — Z79899 Other long term (current) drug therapy: Secondary | ICD-10-CM

## 2024-02-17 DIAGNOSIS — R0602 Shortness of breath: Secondary | ICD-10-CM | POA: Diagnosis not present

## 2024-02-17 DIAGNOSIS — I716 Thoracoabdominal aortic aneurysm, without rupture, unspecified: Secondary | ICD-10-CM | POA: Diagnosis not present

## 2024-02-17 DIAGNOSIS — I739 Peripheral vascular disease, unspecified: Secondary | ICD-10-CM

## 2024-02-17 DIAGNOSIS — I10 Essential (primary) hypertension: Secondary | ICD-10-CM | POA: Diagnosis not present

## 2024-02-17 DIAGNOSIS — D649 Anemia, unspecified: Secondary | ICD-10-CM | POA: Diagnosis not present

## 2024-02-17 DIAGNOSIS — Z951 Presence of aortocoronary bypass graft: Secondary | ICD-10-CM | POA: Diagnosis not present

## 2024-02-17 DIAGNOSIS — I2510 Atherosclerotic heart disease of native coronary artery without angina pectoris: Secondary | ICD-10-CM

## 2024-02-17 DIAGNOSIS — E785 Hyperlipidemia, unspecified: Secondary | ICD-10-CM | POA: Diagnosis not present

## 2024-02-17 NOTE — Patient Instructions (Signed)
 Medication Instructions:  Your physician recommends that you continue on your current medications as directed. Please refer to the Current Medication list given to you today.   *If you need a refill on your cardiac medications before your next appointment, please call your pharmacy*  Lab Work: Your provider would like for you to have following labs drawn today CBC.   If you have labs (blood work) drawn today and your tests are completely normal, you will receive your results only by: MyChart Message (if you have MyChart) OR A paper copy in the mail If you have any lab test that is abnormal or we need to change your treatment, we will call you to review the results.  Testing/Procedures: Your physician has requested that you have an echocardiogram. Echocardiography is a painless test that uses sound waves to create images of your heart. It provides your doctor with information about the size and shape of your heart and how well your heart's chambers and valves are working.   You may receive an ultrasound enhancing agent through an IV if needed to better visualize your heart during the echo. This procedure takes approximately one hour.  There are no restrictions for this procedure.  This will take place at [address] [facility]) [address], [place] [address]  Please note: We ask at that you not bring children with you during ultrasound (echo/ vascular) testing. Due to room size and safety concerns, children are not allowed in the ultrasound rooms during exams. Our front office staff cannot provide observation of children in our lobby area while testing is being conducted. An adult accompanying a patient to their appointment will only be allowed in the ultrasound room at the discretion of the ultrasound technician under special circumstances. We apologize for any inconvenience.   Follow-Up: At [facility], you and your health needs are our priority.  As part of our  continuing mission to provide you with exceptional heart care, our providers are all part of one team.  This team includes your primary Cardiologist (physician) and Advanced Practice Providers or APPs (Physician Assistants and Nurse Practitioners) who all work together to provide you with the care you need, when you need it.  Your next appointment:   6 month(s)  Provider:   You may see Lonni Hanson, MD or Bernardino Bring, PA-C

## 2024-02-17 NOTE — Progress Notes (Signed)
 Cardiology Office Note    Date:  02/17/2024   ID:  Kaylee Pope, Kaylee Pope 09-Sep-1955, MRN [id number]  PCP:  Valora Lynwood FALCON, MD  Cardiologist:  Lonni Hanson, MD  Electrophysiologist:  None   Chief Complaint: Follow up  History of Present Illness:   Kaylee Pope is a 68 y.o. female with history of CAD status post four-vessel CABG on 07/27/2022 with LIMA to LAD, SVG to diagonal, SVG to OM, and SVG to PDA, thoracoabdominal aortic aneurysm status post repair at [facility] 11/2022 with endoleak s/p repair in 12/2022 with significant complication as outlined below s/p TEVAR with left SCA branch device for type V endoleak in 12/2023, balloon angioplasty to left renal artery stent for type II endoleak in 08/2023, HTN, HLD, and hypothyroidism who presents for follow-up of CAD.  She has a history of enlarging thoracoabdominal aortic aneurysm measuring 5.7 cm and was scheduled for endovascular repair at [facility]. In this setting, she underwent preoperative cardiac risk stratification due to exertional substernal and left-sided chest discomfort. Echo performed at [facility] on 07/10/2022 showed an EF greater than 55%, normal RV systolic function and ventricular cavity size, and mild aortic insufficiency. Carotid artery ultrasound at that time showed less than 50% bilateral ICA stenoses. She underwent coronary CTA on 07/12/2022 which demonstrated a calcium  score of 1361 which was the 99th percentile. There was significant dilatation of the descending thoracic aorta measuring 56 mm in diameter with severe mural atheroma. The RCA was dominant with a CTO of the proximal to mid segment, 50 to 69% proximal LAD stenosis, and 25 to 49% proximal nondominant LCx stenosis. CT FFR was significant in the mid LAD and LCx. LHC on 07/23/2022 showed severe three-vessel CAD including sequential 70 to 90% proximal and mid LAD stenoses with aneurysmal segment at the takeoff of a large D1 branch, 70 to 80% proximal/mid LCx stenosis and CTO of the mid  RCA with left to right collaterals. Mildly elevated LVEDP estimated at 22 mmHg. She was subsequently evaluated by CVTS and underwent four-vessel CABG on 07/27/2022. Post cath course was largely uncomplicated with expected postoperative volume overload and blood loss anemia. She was diuresed. She did have hypoxia with oxygen saturations in the mid 80s with ambulation and was discharged on supplemental oxygen.  She underwent TEVAR at [facility] on 11/12/2022.  She was seen in our office in 11/2022 for evaluation of recurrent chest pain following endovascular thoracoabdominal aortic aneurysm repair earlier in the month.  Vascular surgery felt her symptoms were not related to her recent vascular repair with known endoleak.  Subsequent MPI was abnormal with a small reversible basal and mid inferior defect.  She subsequently underwent echo that showed low normal LVEF.  Cardiac cath demonstrated severe native vessel CAD with patent bypass grafts.  SVG to acute marginal at 60 to 70% stenosis, though this was not intervened upon given it did not appear severe and was supplying a small, diffusely diseased branch.  Imdur  was added, though this led to headaches.  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  LCIA and LEIA were stented along with left CFA/SFA endarterectomy with patch angioplasty, profundaplasty, and SFA embolectomy.  She was seen in the office  on 01/21/2023 and noted ongoing pain in the pelvis that typically radiated from the left lower pelvis around to her back.  Previous upper back and chest pain were resolved.  She noted fatigue as well.  Post procedure, [facility] decreased her carvedilol  to allow  for some permissive hypertension and to minimize the risk for spinal cord hypoperfusion and injury.  She was on monotherapy with aspirin  81 mg (clopidogrel  was deferred with mild thrombocytopenia).  BP at our office visit was elevated at 172/102, though she reported better readings at home typically in the 140s over 80s.  It was recommended to carvedilol  be escalated for management of hypertension as soon as vascular surgery felt it was not safe to do so.  She subsequently notified our office of episodes of epistaxis on clopidogrel  with recommendation to discuss this with [facility] vascular surgery who subsequently discontinued clopidogrel .  Followed up with [facility] vascular surgery on 02/01/2023 with improved blood pressure of 138/79.  Home BP readings were in the 140s over 80s.   Vascular imaging in 03/2023 showed normal ABI bilaterally with patent left lower extremity stent.   She was last seen in the office in 06/2023 and was doing well from a cardiac perspective.  She did notice some dizziness following titration of amlodipine  to 10 mg leading this to be reduced down to 5 mg with improvement.  She underwent left renal artery angiogram and balloon sheath angioplasty to the left renal artery in 08/2023 for type II endoleak.  She underwent TEVAR with left SCA device on 01/07/2024 at [facility] in the setting of proximal device migration and 2 mm increase in aneurysm sac.  She followed up with vascular surgery in 01/2024 noting near resolution of the chest pain following TEVAR.  CT imaging at that time showed no evidence of cardiomegaly, pleural effusions, and normal caliber pulmonary artery.  She comes in today and is without symptoms of angina or cardiac decompensation.  She does continue to note exertional dyspnea and has been diagnosed with COPD by pulmonology.  Reports she remains on supplemental oxygen at 1 L at times.  Oxygen saturations are in the low to mid 90s.  No lower extremity swelling or progressive orthopnea.   No falls or symptoms concerning for bleeding.  Chest heaviness resolved following TEVAR.  Her weight is up 15 pounds today when compared to her last visit, this is suspected to be in the setting of sedentary weight gain.   Labs independently reviewed: 01/2024 - Hgb 9.3, PLT 129, potassium 3.9, BUN 12, serum creatinine 0.64, magnesium  2.1 06/2023 - TSH normal, albumin  4.4, AST/ALT normal 09/2022 - TC 145, TG 108, HDL 53, LDL 70 07/2022 - A1c 5.9, LP(a) 307.5  Past Medical History:  Diagnosis Date   Allergy    CAD (coronary artery disease)    a. s/p 4-V CABG (LIMA-LAD, VG-diag, VG-OM, VG-PDA) 07/2022   Hyperlipidemia    Hypertension    Thoracoabdominal aortic aneurysm    Thyroid  disease     Past Surgical History:  Procedure Laterality Date   ABDOMINAL AORTIC ANEURYSM REPAIR     CORONARY ARTERY BYPASS GRAFT N/A 07/27/2022   Procedure: CORONARY ARTERY BYPASS GRAFTING (CABG) X4 USING LEFT INTERNAL MAMMARY ARTERY AND BILATERAL ENDOSCOPIC HARVESTED GREATER SAPHENOUS VEINS;  Surgeon: Lucas Dorise POUR, MD;  Location: [facility] OR;  Service: Open Heart Surgery;  Laterality: N/A;   LEFT HEART CATH AND CORONARY ANGIOGRAPHY N/A 07/23/2022   Procedure: LEFT HEART CATH AND CORONARY ANGIOGRAPHY;  Surgeon: Mady Bruckner, MD;  Location: [facility] INVASIVE CV LAB;  Service: Cardiovascular;  Laterality: N/A;   LEFT HEART CATH AND CORS/GRAFTS ANGIOGRAPHY N/A 12/17/2022   Procedure: LEFT HEART CATH AND CORS/GRAFTS ANGIOGRAPHY;  Surgeon: Mady Bruckner, MD;  Location: [facility] INVASIVE CV LAB;  Service: Cardiovascular;  Laterality: N/A;   TEE WITHOUT CARDIOVERSION N/A 07/27/2022   Procedure: TRANSESOPHAGEAL ECHOCARDIOGRAM;  Surgeon: Lucas Dorise POUR, MD;  Location: [facility] OR;  Service: Open Heart Surgery;  Laterality: N/A;   TONSILLECTOMY     age 49    Current Medications: Current Meds  Medication Sig   acetaminophen  (TYLENOL ) 500 MG tablet Take 1,000 mg by mouth every 6 (six) hours as needed for moderate pain.   amLODipine   (NORVASC ) 10 MG tablet Take 1 tablet (10 mg total) by mouth daily. (Patient taking differently: Take 10 mg by mouth daily. Patient states that she is taking 5 mg daily)   carvedilol  (COREG ) 12.5 MG tablet Take 1 tablet (12.5 mg total) by mouth 2 (two) times daily.   clopidogrel  (PLAVIX ) 75 MG tablet Take 1 tablet (75 mg total) by mouth daily.   Fluticasone -Umeclidin-Vilant (TRELEGY ELLIPTA ) 100-62.5-25 MCG/ACT AEPB Inhale 1 puff into the lungs daily.   furosemide  (LASIX ) 20 MG tablet Take 1 tablet (20 mg total) by mouth daily as needed (shortness of breath). (Patient taking differently: Take 20 mg by mouth daily.)   levothyroxine  (SYNTHROID ) 75 MCG tablet Take 1 tablet (75 mcg total) by mouth on empty stomach with a glass of water at least 30-60 minutes before breakfast.   rosuvastatin  (CRESTOR ) 20 MG tablet Take 1 tablet (20 mg total) by mouth daily.   traZODone  (DESYREL ) 50 MG tablet Take 1 tablet (50 mg total) by mouth at bedtime   venlafaxine  XR (EFFEXOR -XR) 150 MG 24 hr capsule Take 1 capsule (150 mg total) by mouth daily.   venlafaxine  XR (EFFEXOR -XR) 75 MG 24 hr capsule Take 1 capsule (75 mg total) by mouth daily.    Allergies:   Patient has no known allergies.   Social History   Socioeconomic History   Marital status: Married    Spouse name: Not on file   Number of children: 2   Years of education: Not on file   Highest education level: Not on file  Occupational History   Not on file  Tobacco Use   Smoking status: Former    Current packs/day: 0.25    Average packs/day: 0.3 packs/day for 50.0 years (12.5 ttl pk-yrs)    Types: Cigarettes    Passive exposure: Current   Smokeless tobacco: Never   Tobacco comments:    Stopped smoking 4/46/2024  Vaping Use   Vaping status: Never Used  Substance and Sexual Activity   Alcohol use: Not Currently   Drug use: Never   Sexual activity: Not on file  Other Topics Concern   Not on file  Social History Narrative   Engineer, Materials at [employer]   One grandson   Social Drivers of Health   Financial Resource Strain: Low Risk (01/08/2024)   Received from [facility]   Overall Financial Resource Strain (CARDIA)    How hard is it for you to pay for the very basics like food, housing, medical care, and heating?: Not very hard  Food Insecurity: No Food Insecurity (02/06/2024)   [facility]   Hunger Vital Sign    Within the past 12 months, you worried that your food would run out before you got the money to buy  more.: Never true    Within the past 12 months, the food you bought just didn't last and you didn't have money to get more.: Never true  Transportation Needs: No Transportation Needs (02/06/2024)   Received from [facility] - Transportation    Lack of Transportation (Medical): No    Lack of Transportation (Non-Medical): No  Physical Activity: Not on file  Stress: Not on file  Social Connections: Not on file     Family History:  The patient's family history includes Breast cancer (age of onset: 26) in her maternal aunt; COPD in her mother; Cancer in her maternal aunt; Diabetes in her sister; Heart Problems in her maternal grandmother; Heart attack in her father; Stroke in her father.  ROS:   12-point review of systems is negative unless otherwise noted in the HPI.   EKGs/Labs/Other Studies Reviewed:    Studies reviewed were summarized above. The additional studies were reviewed today:  [facility] 12/17/2022: Conclusions: Severe native three-vessel coronary artery disease, as detailed below, including total occlusions of the mid LAD, mid LCx, and mid RCA.  Distal RCA branches fill via left-to-right collaterals. Widely patent LIMA-LAD. Patent SVG-D with minimal luminal irregularities. Patent SVG-OM3 with graft ectasia and minimal luminal irregularities. Patent SVG-AM with long 60-70% stenosis in the proximal graft and supplying a small and diffusely disease  branch. Mildly elevated left ventricular filling pressure (LVEDP 20 mmHg).   Recommendations: No culprit lesion identified for recent chest pain and dyspnea that began after TAAA repair with known endoleak.  Will add isosorbide  mononitrate 15 mg daily and furosemide  20 mg daily for medical therapy, including possible component of diastolic heart failure.  SVG-AM is not ideal for PCI, given long segment of moderate to severe disease and small, diffusely disease distal vessel. Continue aggressive secondary prevention of ASCVD. Proceed with staged TAAA repair per [facility] vascular surgery (I have contacted them regarding the findings of today's cath and my recommendations. ___________   Limited echo 12/14/2022: 1. Left ventricular ejection fraction, by estimation, is 50 to 55%. The  left ventricle has low normal function. The average left ventricular  global longitudinal strain is -11.7 %. The global longitudinal strain is  abnormal.   2. Right ventricular systolic function is normal. The right ventricular  size is normal.   3. The mitral valve is normal in structure. Mild mitral valve  regurgitation.   4. The aortic valve is tricuspid. Aortic valve regurgitation is mild.  Aortic valve sclerosis is present, with no evidence of aortic valve  stenosis.   5. The inferior vena cava is normal in size with greater than 50%  respiratory variability, suggesting right atrial pressure of 3 mmHg.  __________   Lexiscan  MPI 12/07/2022:   Abnormal pharmacologic myocardial perfusion stress test.   There is a small in size, moderate in severity, partially reversible defect involving the basal and mid inferior segments consistent with scar and periinfarct ischemia.   Left ventricular systolic function is normal (LVEF 60-65%)   Post CABG findings are present as well as thoracoabdominal aortic aneurysm with endovascular graft in the descending aorta.   Atelectasis versus scarring noted in the left upper lobe.    The study is intermediate risk. __________   2D echo 07/23/2022: 1. Left ventricular ejection fraction, by estimation, is 55 to 60%. The  left ventricle has normal function. The left ventricle has no regional  wall motion abnormalities. There is mild concentric left ventricular  hypertrophy. Left ventricular diastolic  parameters are indeterminate.   2. Right ventricular systolic function is normal. The right ventricular  size is normal.   3. The mitral valve is normal in structure. Trivial mitral valve  regurgitation. No evidence of mitral stenosis.   4. The aortic valve is normal in structure. Aortic valve regurgitation is  not visualized. No aortic stenosis is present.   5. Abodminal aorta is dilated ( 3.3cm).   6. The inferior vena cava is normal in size with greater than 50%  respiratory variability, suggesting right atrial pressure of 3 mmHg.   7. Evidence of atrial level shunting detected by color flow Doppler.  Small secundum ASD noted 1.20 cm.  __________   [facility] 07/23/2022: Conclusions: Severe three-vessel coronary artery disease including sequential 70-90% proximal and mid LAD stenoses with aneurysmal segment at the takeoff of large first diagonal branch, 70-80% proximal/mid LCx stenosis, and chronic total occlusion of mid RCA with left-to-right collaterals. Mildly elevated left ventricular filling pressure (LVEDP 22 mmHg).  LVEF known to be normal based on recent echocardiogram at [facility].   Recommendations: Admit for antianginal therapy, as progressive atypical left-sided chest pain is most likely the patient's anginal equivalent, as well as cardiac surgery consultation for CABG. Aggressive secondary prevention of coronary artery disease. __________   Coronary CTA with CT FFR 07/12/2022: IMPRESSION: 1. Descending thoracic aortic aneurysm this should be followed up with CTA of the chest for complete evaluation. 2. Otherwise no significant extracardiac incidental findings  were seen.   FINDINGS: Aorta: Severe dilation of descending thoracic aorta, 56 mm in diameter, with severe mural atheroma. No dissection.   Aortic Valve:  Trileaflet. mild calcifications.   Coronary Arteries:  Normal coronary origin.  Right dominance.   RCA is a dominant artery. There is calcified and non calcified plaque proximally causing total occlusion in the proximal-mid segment (100%).   Left main gives rise to LAD and LCX arteries. LM has no disease.   LAD has calcified plaque proximally causing moderate stenosis (50-69%).   LCX is a non-dominant artery. There is calcified plaque in the proximal segment causing mild stenosis (25-49%).   Other findings:   Normal pulmonary vein drainage into the left atrium.   Normal left atrial appendage without a thrombus.   Normal size of the pulmonary artery.   IMPRESSION: 1. Coronary calcium  score of 1361. This was 99th percentile for age and sex matched control. 2. Normal coronary origin with right dominance. 3. CTO of proximal to mid RCA. 4. Moderate proximal LAD stenosis (50-69%). 5. Mild proximal LCx stenosis (25-49%). 6. Known severe dilation of descending thoracic aorta, 56 mm in diameter, with severe mural atheroma. 7. CAD-RADS 5 Total coronary occlusion (100%). Consider cardiac catheterization or viability assessment. Consider symptom-guided anti-ischemic pharmacotherapy as well as risk factor modification per guideline directed care. 8. Will submit CT FFR to analyze proximal LAD. Will be reported separately.   1. Left Main:  No significant stenosis.   2. LAD: significant stenosis in the mid LAD.  FFRct 0.71 3. LCX: significant stenosis in the mid LCx.  FFRct 0.77 4. RCA: Total occlusion of mid RCA.   IMPRESSION: 1. CT FFR analysis showed significant stenosis in the mid LAD and LCx. 2.  Total occlusion of mid RCA. 3.  Cardiac catheterization recommended.   EKG:  EKG is ordered today.  The EKG ordered today  demonstrates NSR, 77 bpm, possible prior anterior infarct versus lead placement, nonspecific ST-T changes  Recent Labs: No results found for requested labs within last  365 days.  Recent Lipid Panel    Component Value Date/Time   CHOL 130 09/21/2022 1551   TRIG 111 09/21/2022 1551   HDL 52 09/21/2022 1551   CHOLHDL 2.5 09/21/2022 1551   VLDL 22 09/21/2022 1551   LDLCALC 56 09/21/2022 1551   LDLDIRECT 64 09/21/2022 1551    PHYSICAL EXAM:    VS:  BP (!) 115/58 (BP Location: Left Arm, Patient Position: Sitting, Cuff Size: Normal)   Pulse 77 Comment: 78 oximeter  Ht 5' (1.524 m)   Wt 172 lb 9.6 oz (78.3 kg)   SpO2 95%   BMI 33.71 kg/m   BMI: Body mass index is 33.71 kg/m.  Physical Exam Vitals reviewed.  Constitutional:      Appearance: She is well-developed.  HENT:     Head: Normocephalic and atraumatic.  Eyes:     General:        Right eye: No discharge.        Left eye: No discharge.  Cardiovascular:     Rate and Rhythm: Normal rate and regular rhythm.     Heart sounds: Normal heart sounds, S1 normal and S2 normal. Heart sounds not distant. No midsystolic click and no opening snap. No murmur heard.    No friction rub.  Pulmonary:     Effort: Pulmonary effort is normal. No respiratory distress.     Breath sounds: Normal breath sounds. No decreased breath sounds, wheezing, rhonchi or rales.  Musculoskeletal:     Cervical back: Normal range of motion.     Right lower leg: No edema.     Left lower leg: No edema.  Skin:    General: Skin is warm and dry.     Nails: There is no clubbing.  Neurological:     Mental Status: She is alert and oriented to person, place, and time.  Psychiatric:        Speech: Speech normal.        Behavior: Behavior normal.        Thought Content: Thought content normal.        Judgment: Judgment normal.     Wt Readings from Last 3 Encounters:  02/17/24 172 lb 9.6 oz (78.3 kg)  10/29/23 169 lb 2 oz (76.7 kg)  09/18/23 166 lb 14.4 oz  (75.7 kg)     ASSESSMENT & PLAN:   CAD status post CABG without angina: She is without symptoms of angina.  Chest heaviness resolved following repeat vascular surgery.  Continue aggressive risk factor modification and secondary prevention including clopidogrel  75 mg, amlodipine  10 mg, carvedilol  12.5 mg twice daily, and rosuvastatin  20 mg.  No plans for further cardiac testing at this time.  HTN: Blood pressure is well-controlled in the office today.  She remains on amlodipine  10 mg and carvedilol  12.5 mg twice daily.  HLD: LDL 70.  She remains on rosuvastatin  20 mg.  Thoracoabdominal aortic aneurysm: Status post TEVAR on 11/12/2022, four-vessel FEVAR distal TEVAR extension, left CIA and EIA stent x 3 on 01/01/2023 complicated by iliac artery dissection resulting in endarterectomy and patch profundoplasty with distal external stent extending status post balloon angioplasty to left renal artery stent for type II endoleak on 08/12/2023, and most recently TEVAR with left SCA branch device for type V endoleak on 01/07/2024.  Remains on clopidogrel  and rosuvastatin  as above.  Followed by [facility] vascular surgery.  Chronic dyspnea: Recent CT showed no evidence of pleural effusions within normal caliber pulmonary artery.  Suspect this is multifactorial including  underlying COPD, normocytic anemia, and physical deconditioning.  It does not appear to be a primary cardiac etiology.  Will obtain echo to evaluate for any new structural abnormalities.  If this is reassuring recommend she continue to follow-up with pulmonology, may benefit from pulmonary rehab.  Normocytic anemia: Likely acute blood loss anemia in the setting of her recent surgery.  Trend CBC.   Disposition: F/u with Dr. Mady or an APP in 6 months.   Medication Adjustments/Labs and Tests Ordered: Current medicines are reviewed at length with the patient today.  Concerns regarding medicines are outlined above. Medication changes, Labs and Tests ordered  today are summarized above and listed in the Patient Instructions accessible in Encounters.   Signed, Bernardino Bring, PA-C 02/17/2024 3:25 PM     [facility] [place] [address] [place], [place] [address] [phone]

## 2024-02-18 ENCOUNTER — Ambulatory Visit: Payer: Self-pay | Admitting: Physician Assistant

## 2024-02-18 LAB — CBC
Hematocrit: 36.4 % (ref 34.0–46.6)
Hemoglobin: 11.6 g/dL (ref 11.1–15.9)
MCH: 30.1 pg (ref 26.6–33.0)
MCHC: 31.9 g/dL (ref 31.5–35.7)
MCV: 95 fL (ref 79–97)
Platelets: 225 x10E3/uL (ref 150–450)
RBC: 3.85 x10E6/uL (ref 3.77–5.28)
RDW: 14.3 % (ref 11.7–15.4)
WBC: 6.6 x10E3/uL (ref 3.4–10.8)

## 2024-02-19 ENCOUNTER — Ambulatory Visit: Attending: Physician Assistant

## 2024-02-19 DIAGNOSIS — R0602 Shortness of breath: Secondary | ICD-10-CM

## 2024-02-19 LAB — ECHOCARDIOGRAM COMPLETE
AR max vel: 2.17 cm2
AV Area VTI: 2.14 cm2
AV Area mean vel: 2.14 cm2
AV Mean grad: 3 mmHg
AV Peak grad: 4.8 mmHg
Ao pk vel: 1.1 m/s
Area-P 1/2: 3.12 cm2
MV M vel: 2.18 m/s
MV Peak grad: 19 mmHg
S' Lateral: 2.85 cm

## 2024-02-24 ENCOUNTER — Other Ambulatory Visit: Payer: Self-pay

## 2024-03-03 ENCOUNTER — Other Ambulatory Visit (HOSPITAL_COMMUNITY): Payer: Self-pay

## 2024-03-03 DIAGNOSIS — J449 Chronic obstructive pulmonary disease, unspecified: Secondary | ICD-10-CM | POA: Diagnosis not present

## 2024-03-03 DIAGNOSIS — R0602 Shortness of breath: Secondary | ICD-10-CM | POA: Diagnosis not present

## 2024-03-03 DIAGNOSIS — E66811 Obesity, class 1: Secondary | ICD-10-CM | POA: Diagnosis not present

## 2024-03-03 DIAGNOSIS — K449 Diaphragmatic hernia without obstruction or gangrene: Secondary | ICD-10-CM | POA: Diagnosis not present

## 2024-03-03 DIAGNOSIS — Z87891 Personal history of nicotine dependence: Secondary | ICD-10-CM | POA: Diagnosis not present

## 2024-03-03 MED ORDER — ALBUTEROL SULFATE HFA 108 (90 BASE) MCG/ACT IN AERS
2.0000 | INHALATION_SPRAY | Freq: Four times a day (QID) | RESPIRATORY_TRACT | 2 refills | Status: AC | PRN
  Filled 2024-03-03: qty 20.1, 75d supply, fill #0
  Filled 2024-04-19 – 2024-04-23 (×2): qty 20.1, 75d supply, fill #1

## 2024-03-04 ENCOUNTER — Other Ambulatory Visit (HOSPITAL_COMMUNITY): Payer: Self-pay

## 2024-03-04 ENCOUNTER — Other Ambulatory Visit: Payer: Self-pay

## 2024-03-04 MED ORDER — COMBIVENT RESPIMAT 20-100 MCG/ACT IN AERS
2.0000 | INHALATION_SPRAY | Freq: Two times a day (BID) | RESPIRATORY_TRACT | 2 refills | Status: AC
Start: 2024-03-03 — End: ?
  Filled 2024-03-04: qty 12, 90d supply, fill #0

## 2024-04-06 ENCOUNTER — Other Ambulatory Visit (HOSPITAL_COMMUNITY): Payer: Self-pay

## 2024-04-19 ENCOUNTER — Other Ambulatory Visit: Payer: Self-pay | Admitting: Physician Assistant

## 2024-04-19 DIAGNOSIS — I2510 Atherosclerotic heart disease of native coronary artery without angina pectoris: Secondary | ICD-10-CM

## 2024-04-20 ENCOUNTER — Other Ambulatory Visit: Payer: Self-pay

## 2024-04-20 MED ORDER — AMLODIPINE BESYLATE 10 MG PO TABS
10.0000 mg | ORAL_TABLET | Freq: Every day | ORAL | 3 refills | Status: AC
  Filled 2024-04-20: qty 90, 90d supply, fill #0

## 2024-04-24 ENCOUNTER — Other Ambulatory Visit: Payer: Self-pay

## 2024-08-18 ENCOUNTER — Ambulatory Visit: Admitting: Physician Assistant
